# Patient Record
Sex: Female | Born: 1990 | Race: White | Hispanic: No | Smoking: Never smoker
Health system: Southern US, Community
[De-identification: ages and names within clinical notes are randomized; demographics above are authoritative.]

## PROBLEM LIST (undated history)

## (undated) DIAGNOSIS — O210 Mild hyperemesis gravidarum: Secondary | ICD-10-CM

## (undated) DIAGNOSIS — F53 Postpartum depression: Secondary | ICD-10-CM

## (undated) DIAGNOSIS — N83209 Unspecified ovarian cyst, unspecified side: Secondary | ICD-10-CM

## (undated) DIAGNOSIS — O99345 Other mental disorders complicating the puerperium: Secondary | ICD-10-CM

---

## 2019-03-25 ENCOUNTER — Emergency Department (HOSPITAL_BASED_OUTPATIENT_CLINIC_OR_DEPARTMENT_OTHER)
Admission: EM | Admit: 2019-03-25 | Discharge: 2019-03-25 | Disposition: A | Discharge status: Home / Self Care | Payer: Medicaid Other | Attending: Emergency Medicine | Admitting: Emergency Medicine

## 2019-03-25 ENCOUNTER — Emergency Department (HOSPITAL_BASED_OUTPATIENT_CLINIC_OR_DEPARTMENT_OTHER): Payer: Medicaid Other

## 2019-03-25 ENCOUNTER — Other Ambulatory Visit: Payer: Self-pay

## 2019-03-25 ENCOUNTER — Encounter (HOSPITAL_BASED_OUTPATIENT_CLINIC_OR_DEPARTMENT_OTHER): Payer: Self-pay | Admitting: *Deleted

## 2019-03-25 DIAGNOSIS — K529 Noninfective gastroenteritis and colitis, unspecified: Secondary | ICD-10-CM

## 2019-03-25 DIAGNOSIS — R1011 Right upper quadrant pain: Secondary | ICD-10-CM | POA: Diagnosis present

## 2019-03-25 DIAGNOSIS — Z79899 Other long term (current) drug therapy: Secondary | ICD-10-CM | POA: Diagnosis not present

## 2019-03-25 HISTORY — DX: Postpartum depression: F53.0

## 2019-03-25 HISTORY — DX: Other mental disorders complicating the puerperium: O99.345

## 2019-03-25 LAB — CBC WITH DIFFERENTIAL/PLATELET
Abs Immature Granulocytes: 0.03 10*3/uL (ref 0.00–0.07)
Basophils Absolute: 0 10*3/uL (ref 0.0–0.1)
Basophils Relative: 0 %
Eosinophils Absolute: 0.1 10*3/uL (ref 0.0–0.5)
Eosinophils Relative: 1 %
HCT: 39.2 % (ref 36.0–46.0)
Hemoglobin: 12.5 g/dL (ref 12.0–15.0)
Immature Granulocytes: 0 %
Lymphocytes Relative: 40 %
Lymphs Abs: 3.6 10*3/uL (ref 0.7–4.0)
MCH: 27.5 pg (ref 26.0–34.0)
MCHC: 31.9 g/dL (ref 30.0–36.0)
MCV: 86.3 fL (ref 80.0–100.0)
Monocytes Absolute: 0.5 10*3/uL (ref 0.1–1.0)
Monocytes Relative: 5 %
Neutro Abs: 5 10*3/uL (ref 1.7–7.7)
Neutrophils Relative %: 54 %
Platelets: 327 10*3/uL (ref 150–400)
RBC: 4.54 MIL/uL (ref 3.87–5.11)
RDW: 15.5 % (ref 11.5–15.5)
WBC: 9.2 10*3/uL (ref 4.0–10.5)
nRBC: 0 % (ref 0.0–0.2)

## 2019-03-25 LAB — COMPREHENSIVE METABOLIC PANEL
ALT: 12 U/L (ref 0–44)
AST: 16 U/L (ref 15–41)
Albumin: 3.2 g/dL — ABNORMAL LOW (ref 3.5–5.0)
Alkaline Phosphatase: 65 U/L (ref 38–126)
Anion gap: 10 (ref 5–15)
BUN: 7 mg/dL (ref 6–20)
CO2: 26 mmol/L (ref 22–32)
Calcium: 9.3 mg/dL (ref 8.9–10.3)
Chloride: 104 mmol/L (ref 98–111)
Creatinine, Ser: 0.84 mg/dL (ref 0.44–1.00)
GFR calc Af Amer: >60 mL/min (ref >60)
GFR calc non Af Amer: >60 mL/min (ref >60)
Glucose, Bld: 121 mg/dL — ABNORMAL HIGH (ref 70–99)
Potassium: 4.1 mmol/L (ref 3.5–5.1)
Sodium: 140 mmol/L (ref 135–145)
Total Bilirubin: 0.2 mg/dL — ABNORMAL LOW (ref 0.3–1.2)
Total Protein: 6.5 g/dL (ref 6.5–8.1)

## 2019-03-25 LAB — PREGNANCY, URINE: Preg Test, Ur: NEGATIVE

## 2019-03-25 LAB — LIPASE, BLOOD: Lipase: 25 U/L (ref 11–51)

## 2019-03-25 LAB — URINALYSIS, MICROSCOPIC (REFLEX)

## 2019-03-25 LAB — URINALYSIS, ROUTINE W REFLEX MICROSCOPIC
Bilirubin Urine: NEGATIVE
Glucose, UA: NEGATIVE mg/dL
Hgb urine dipstick: NEGATIVE
Ketones, ur: NEGATIVE mg/dL
Nitrite: NEGATIVE
Protein, ur: NEGATIVE mg/dL
Specific Gravity, Urine: 1.025 (ref 1.005–1.030)
pH: 6 (ref 5.0–8.0)

## 2019-03-25 MED ORDER — TRAMADOL HCL 50 MG PO TABS: 50.0000 mg | ORAL_TABLET | Freq: Four times a day (QID) | ORAL | 0 refills | Status: DC | PRN

## 2019-03-25 MED ORDER — ONDANSETRON HCL 4 MG/2ML IJ SOLN
4.0000 mg | Freq: Once | INTRAMUSCULAR | Status: AC
  Administered 2019-03-25: 4 mg via INTRAVENOUS
  Filled 2019-03-25: qty 2

## 2019-03-25 MED ORDER — MORPHINE SULFATE (PF) 4 MG/ML IV SOLN
4.0000 mg | Freq: Once | INTRAVENOUS | Status: AC
  Administered 2019-03-25: 4 mg via INTRAVENOUS
  Filled 2019-03-25: qty 1

## 2019-03-25 MED ORDER — SODIUM CHLORIDE 0.9 % IV BOLUS
1000.0000 mL | Freq: Once | INTRAVENOUS | Status: AC
  Administered 2019-03-25: 1000 mL via INTRAVENOUS

## 2019-03-25 MED ORDER — ONDANSETRON 8 MG PO TBDP: ORAL_TABLET | ORAL | 0 refills | Status: DC

## 2019-03-25 NOTE — Discharge Instructions (Addendum)
Take Zofran as prescribed as needed for nausea and tramadol as prescribed as needed for pain.  Return the emergency department if you develop severe abdominal pain, bloody stools, high fever, or other new and concerning symptoms.

## 2019-03-25 NOTE — ED Triage Notes (Signed)
Nausea and vomiting since Friday and RUQ pain started this morning.

## 2019-03-25 NOTE — ED Notes (Signed)
ED Provider at bedside. 

## 2019-03-25 NOTE — ED Provider Notes (Signed)
[facility] EMERGENCY DEPARTMENT Provider Note   CSN: [id number] Arrival date & time: 03/25/19  7829     History Chief Complaint  Patient presents with  . Nausea  . Vomiting, RUQ pain    Jordan Chung is a 29 y.o. female.  Patient is a 29 year old female with no significant past medical history.  She presents today for evaluation of right upper quadrant pain.  She reports nausea and vomiting 2 days ago, then developed the pain this morning.  She denies fevers or chills.  Her symptoms are worse when she attempts to eat.  She denies any urinary complaints.  She denies any irregularity with her menses.  The history is provided by the patient.       Past Medical History:  Diagnosis Date  . Postpartum depression     There are no problems to display for this patient.   History reviewed. No pertinent surgical history.   OB History    Gravida  1   Para  1   Term      Preterm      AB      Living        SAB      TAB      Ectopic      Multiple      Live Births              History reviewed. No pertinent family history.  Social History   Tobacco Use  . Smoking status: Never Smoker  . Smokeless tobacco: Never Used  Substance Use Topics  . Alcohol use: Never  . Drug use: Never    Home Medications Prior to Admission medications   Medication Sig Start Date End Date Taking? Authorizing Provider  buPROPion (WELLBUTRIN XL) 300 MG 24 hr tablet Take 300 mg by mouth daily.   Yes [provider]  LORazepam (ATIVAN) 0.5 MG tablet Take 0.5 mg by mouth every 8 (eight) hours as needed for anxiety.   Yes [provider]  norgestimate-ethinyl estradiol (ORTHO-CYCLEN) 0.25-35 MG-MCG tablet Take 1 tablet by mouth daily.   Yes [provider]    Allergies    Patient has no known allergies.  Review of Systems   Review of Systems  All other systems reviewed and are negative.   Physical Exam Updated Vital Signs LMP 03/13/2019    Physical Exam Vitals and nursing note reviewed.  Constitutional:      General: She is not in acute distress.    Appearance: She is well-developed. She is not diaphoretic.  HENT:     Head: Normocephalic and atraumatic.  Cardiovascular:     Rate and Rhythm: Normal rate and regular rhythm.     Heart sounds: No murmur. No friction rub. No gallop.   Pulmonary:     Effort: Pulmonary effort is normal. No respiratory distress.     Breath sounds: Normal breath sounds. No wheezing.  Abdominal:     General: Bowel sounds are normal. There is no distension.     Palpations: Abdomen is soft.     Tenderness: There is abdominal tenderness. There is no guarding or rebound.     Comments: There is ttp in the RUQ.  No rebound or guarding.  Musculoskeletal:        General: Normal range of motion.     Cervical back: Normal range of motion and neck supple.  Skin:    General: Skin is warm and dry.  Neurological:  Mental Status: She is alert and oriented to person, place, and time.     ED Results / Procedures / Treatments   Labs (all labs ordered are listed, but only abnormal results are displayed) Labs Reviewed  COMPREHENSIVE METABOLIC PANEL  LIPASE, BLOOD  CBC WITH DIFFERENTIAL/PLATELET  URINALYSIS, ROUTINE W REFLEX MICROSCOPIC  PREGNANCY, URINE    EKG None  Radiology No results found.  Procedures Procedures (including critical care time)  Medications Ordered in ED Medications  sodium chloride 0.9 % bolus 1,000 mL (has no administration in time range)  ondansetron (ZOFRAN) injection 4 mg (has no administration in time range)    ED Course  I have reviewed the triage vital signs and the nursing notes.  Pertinent labs & imaging results that were available during my care of the patient were reviewed by me and considered in my medical decision making (see chart for details).    MDM Rules/Calculators/A&P  Patient presenting with complaints of RUQ pain, nausea, and vomiting.   She is ttp in the RUQ, but workup is unremarkable.  US shows no abnormality, labs are normal, and UA is clear.  I suspect some sort of enteritis/colitis.  I have discussed this with the patient and will discharge with pain and nausea meds.  Patient offered CT scan, but we have decided to forego for now and see how she does.  If she worsens, she is to return to the ED for additional imaging.  Final Clinical Impression(s) / ED Diagnoses Final diagnoses:  RUQ pain    Rx / DC Orders ED Discharge Orders    None       Geoffery Lyons, MD 03/25/19 1030

## 2019-03-27 ENCOUNTER — Encounter (HOSPITAL_BASED_OUTPATIENT_CLINIC_OR_DEPARTMENT_OTHER): Payer: Self-pay | Admitting: Emergency Medicine

## 2019-03-27 ENCOUNTER — Emergency Department (HOSPITAL_BASED_OUTPATIENT_CLINIC_OR_DEPARTMENT_OTHER): Payer: Medicaid Other

## 2019-03-27 ENCOUNTER — Other Ambulatory Visit: Payer: Self-pay

## 2019-03-27 ENCOUNTER — Emergency Department (HOSPITAL_BASED_OUTPATIENT_CLINIC_OR_DEPARTMENT_OTHER)
Admission: EM | Admit: 2019-03-27 | Discharge: 2019-03-27 | Disposition: A | Discharge status: Home / Self Care | Payer: Medicaid Other | Attending: Emergency Medicine | Admitting: Emergency Medicine

## 2019-03-27 DIAGNOSIS — R1011 Right upper quadrant pain: Secondary | ICD-10-CM | POA: Diagnosis not present

## 2019-03-27 DIAGNOSIS — Z793 Long term (current) use of hormonal contraceptives: Secondary | ICD-10-CM | POA: Diagnosis not present

## 2019-03-27 DIAGNOSIS — Z79899 Other long term (current) drug therapy: Secondary | ICD-10-CM | POA: Insufficient documentation

## 2019-03-27 DIAGNOSIS — R1031 Right lower quadrant pain: Secondary | ICD-10-CM | POA: Insufficient documentation

## 2019-03-27 DIAGNOSIS — R109 Unspecified abdominal pain: Secondary | ICD-10-CM

## 2019-03-27 LAB — CBC WITH DIFFERENTIAL/PLATELET
Abs Immature Granulocytes: 0.03 10*3/uL (ref 0.00–0.07)
Basophils Absolute: 0 10*3/uL (ref 0.0–0.1)
Basophils Relative: 0 %
Eosinophils Absolute: 0 10*3/uL (ref 0.0–0.5)
Eosinophils Relative: 0 %
HCT: 42.9 % (ref 36.0–46.0)
Hemoglobin: 13.9 g/dL (ref 12.0–15.0)
Immature Granulocytes: 0 %
Lymphocytes Relative: 19 %
Lymphs Abs: 2.2 10*3/uL (ref 0.7–4.0)
MCH: 27.9 pg (ref 26.0–34.0)
MCHC: 32.4 g/dL (ref 30.0–36.0)
MCV: 86 fL (ref 80.0–100.0)
Monocytes Absolute: 0.2 10*3/uL (ref 0.1–1.0)
Monocytes Relative: 1 %
Neutro Abs: 9 10*3/uL — ABNORMAL HIGH (ref 1.7–7.7)
Neutrophils Relative %: 80 %
Platelets: 386 10*3/uL (ref 150–400)
RBC: 4.99 MIL/uL (ref 3.87–5.11)
RDW: 15.5 % (ref 11.5–15.5)
WBC: 11.5 10*3/uL — ABNORMAL HIGH (ref 4.0–10.5)
nRBC: 0 % (ref 0.0–0.2)

## 2019-03-27 LAB — URINALYSIS, ROUTINE W REFLEX MICROSCOPIC
Bilirubin Urine: NEGATIVE
Glucose, UA: NEGATIVE mg/dL
Hgb urine dipstick: NEGATIVE
Ketones, ur: 15 mg/dL — AB
Nitrite: NEGATIVE
Protein, ur: NEGATIVE mg/dL
Specific Gravity, Urine: 1.02 (ref 1.005–1.030)
pH: 7.5 (ref 5.0–8.0)

## 2019-03-27 LAB — LIPASE, BLOOD: Lipase: 28 U/L (ref 11–51)

## 2019-03-27 LAB — URINALYSIS, MICROSCOPIC (REFLEX)

## 2019-03-27 LAB — COMPREHENSIVE METABOLIC PANEL
ALT: 11 U/L (ref 0–44)
AST: 16 U/L (ref 15–41)
Albumin: 3.6 g/dL (ref 3.5–5.0)
Alkaline Phosphatase: 72 U/L (ref 38–126)
Anion gap: 8 (ref 5–15)
BUN: 10 mg/dL (ref 6–20)
CO2: 25 mmol/L (ref 22–32)
Calcium: 8.9 mg/dL (ref 8.9–10.3)
Chloride: 102 mmol/L (ref 98–111)
Creatinine, Ser: 0.99 mg/dL (ref 0.44–1.00)
GFR calc Af Amer: >60 mL/min (ref >60)
GFR calc non Af Amer: >60 mL/min (ref >60)
Glucose, Bld: 159 mg/dL — ABNORMAL HIGH (ref 70–99)
Potassium: 4 mmol/L (ref 3.5–5.1)
Sodium: 135 mmol/L (ref 135–145)
Total Bilirubin: 0.2 mg/dL — ABNORMAL LOW (ref 0.3–1.2)
Total Protein: 7.9 g/dL (ref 6.5–8.1)

## 2019-03-27 LAB — PREGNANCY, URINE: Preg Test, Ur: NEGATIVE

## 2019-03-27 MED ORDER — SODIUM CHLORIDE 0.9 % IV BOLUS
1000.0000 mL | Freq: Once | INTRAVENOUS | Status: AC
  Administered 2019-03-27: 1000 mL via INTRAVENOUS

## 2019-03-27 MED ORDER — DICYCLOMINE HCL 20 MG PO TABS: 20.0000 mg | ORAL_TABLET | Freq: Two times a day (BID) | ORAL | 0 refills | Status: DC

## 2019-03-27 MED ORDER — KETOROLAC TROMETHAMINE 30 MG/ML IJ SOLN
30.0000 mg | Freq: Once | INTRAMUSCULAR | Status: AC
  Administered 2019-03-27: 30 mg via INTRAVENOUS
  Filled 2019-03-27: qty 1

## 2019-03-27 MED ORDER — IOHEXOL 300 MG/ML  SOLN
100.0000 mL | Freq: Once | INTRAMUSCULAR | Status: AC | PRN
  Administered 2019-03-27: 100 mL via INTRAVENOUS

## 2019-03-27 NOTE — ED Triage Notes (Signed)
Reports being seen Wednesday for the same.  Continues to have RUQ abdominal pain with n/v.  Able to keep water and gatorade down only.

## 2019-03-27 NOTE — ED Provider Notes (Addendum)
[facility] EMERGENCY DEPARTMENT Provider Note   CSN: [id number] Arrival date & time: 03/27/19  1753     History Chief Complaint  Patient presents with  . Abdominal Pain    Jordan Chung is a 29 y.o. female with no significant PMH with complaints of persistent right upper quadrant abdominal discomfort that has now moved down into the RLQ region, as well.  Patient was evaluated on 03/25/2019 in the ER and ultrasound obtained of right upper quadrant was negative for any acute pathology.  She declined CT abdomen and pelvis and was ultimately diagnosed with a colitis/enteritis.  She was instructed to return to the ED should she experience any new or worsening symptoms.  Patient states that she is been taking her Zofran ODT, as prescribed, without relief.  She also reports that her abdominal discomfort has worsened and is now 8 out of 10 pain in her RUQ and RLQ region.  She also endorses diminished appetite and worsening abdominal discomfort two hours after consumption of food.  She spoke with her PCP today who instructed her to come to the ED for imaging.  She denies any fevers or chills, headache or dizziness, chest pain or difficulty breathing, cough, dysuria, increased urinary frequency, vaginal pain, vaginal discharge, vaginal bleeding, hematochezia, melena, or other changes in bowel habits.  HPI     Past Medical History:  Diagnosis Date  . Postpartum depression     There are no problems to display for this patient.   History reviewed. No pertinent surgical history.   OB History    Gravida  1   Para  1   Term      Preterm      AB      Living        SAB      TAB      Ectopic      Multiple      Live Births              No family history on file.  Social History   Tobacco Use  . Smoking status: Never Smoker  . Smokeless tobacco: Never Used  Substance Use Topics  . Alcohol use: Never  . Drug use: Never    Home Medications Prior to Admission  medications   Medication Sig Start Date End Date Taking? Authorizing Provider  omeprazole (PRILOSEC) 20 MG capsule Take 20 mg by mouth daily.   Yes [provider]  predniSONE (STERAPRED UNI-PAK 21 TAB) 10 MG (21) TBPK tablet Take 60 mg by mouth daily. Dose pack   Yes [provider]  promethazine (PHENERGAN) 25 MG tablet Take 25 mg by mouth 2 (two) times daily as needed for nausea or vomiting.   Yes [provider]  Sulfacetamide Sodium (SULFAC OP) Apply to eye.   Yes [provider]  sulfamethoxazole-trimethoprim (BACTRIM) 400-80 MG tablet Take 1 tablet by mouth 2 (two) times daily.   Yes [provider]  buPROPion (WELLBUTRIN XL) 300 MG 24 hr tablet Take 300 mg by mouth daily.    [provider]  dicyclomine (BENTYL) 20 MG tablet Take 1 tablet (20 mg total) by mouth 2 (two) times daily. 03/27/19   Lorelee New, PA-C  LORazepam (ATIVAN) 0.5 MG tablet Take 0.5 mg by mouth every 8 (eight) hours as needed for anxiety.    [provider]  norgestimate-ethinyl estradiol (ORTHO-CYCLEN) 0.25-35 MG-MCG tablet Take 1 tablet by mouth daily.    [provider]  ondansetron (ZOFRAN ODT) 8 MG disintegrating tablet 8mg  ODT q4 hours prn nausea 03/25/19   Veryl Speak, MD  traMADol (ULTRAM) 50 MG tablet Take 1 tablet (50 mg total) by mouth every 6 (six) hours as needed. 03/25/19   Veryl Speak, MD    Allergies    Patient has no known allergies.  Review of Systems   Review of Systems  All other systems reviewed and are negative.   Physical Exam Updated Vital Signs BP 115/78 (BP Location: Right Arm)   Pulse 80   Temp 98.6 F (37 C) (Oral)   Resp 14   Ht 5\' 8"  (1.727 m)   Wt 126.1 kg   LMP 03/13/2019   SpO2 98%   BMI 42.27 kg/m   Physical Exam Vitals and nursing note reviewed. Exam conducted with a chaperone present.  Constitutional:      Appearance: Normal appearance.  HENT:     Head: Normocephalic and atraumatic.   Eyes:     General: No scleral icterus.    Conjunctiva/sclera: Conjunctivae normal.  Pulmonary:     Effort: Pulmonary effort is normal.  Abdominal:     Comments: Soft, nondistended. TTP in RUQ and RLQ. No significant TTP elsewhere. No guarding. No overlying skin changes. NABS.  Skin:    General: Skin is dry.  Neurological:     Mental Status: She is alert.     GCS: GCS eye subscore is 4. GCS verbal subscore is 5. GCS motor subscore is 6.  Psychiatric:        Mood and Affect: Mood normal.        Behavior: Behavior normal.        Thought Content: Thought content normal.     ED Results / Procedures / Treatments   Labs (all labs ordered are listed, but only abnormal results are displayed) Labs Reviewed  CBC WITH DIFFERENTIAL/PLATELET - Abnormal; Notable for the following components:      Result Value   WBC 11.5 (*)    Neutro Abs 9.0 (*)    All other components within normal limits  COMPREHENSIVE METABOLIC PANEL - Abnormal; Notable for the following components:   Glucose, Bld 159 (*)    Total Bilirubin 0.2 (*)    All other components within normal limits  URINALYSIS, ROUTINE W REFLEX MICROSCOPIC - Abnormal; Notable for the following components:   APPearance HAZY (*)    Ketones, ur 15 (*)    Leukocytes,Ua SMALL (*)    All other components within normal limits  URINALYSIS, MICROSCOPIC (REFLEX) - Abnormal; Notable for the following components:   Bacteria, UA FEW (*)    All other components within normal limits  LIPASE, BLOOD  PREGNANCY, URINE    EKG None  Radiology CT ABDOMEN PELVIS W CONTRAST  Result Date: 03/27/2019 CLINICAL DATA:  Right lower quadrant pain EXAM: CT ABDOMEN AND PELVIS WITH CONTRAST TECHNIQUE: Multidetector CT imaging of the abdomen and pelvis was performed using the standard protocol following bolus administration of intravenous contrast. CONTRAST:  147mL OMNIPAQUE IOHEXOL 300 MG/ML  SOLN COMPARISON:  None. FINDINGS: Lower chest: Lung bases are clear. No  effusions. Heart is normal size. Hepatobiliary: No focal hepatic abnormality. Gallbladder unremarkable. Pancreas: No focal abnormality or ductal dilatation. Spleen: No focal abnormality.  Normal size. Adrenals/Urinary Tract: Punctate nonobstructing stones in the kidneys bilaterally. No ureteral stones or hydronephrosis. Adrenal glands and urinary bladder unremarkable. Stomach/Bowel: Stomach, large and small bowel grossly unremarkable. Normal appendix. Vascular/Lymphatic: No evidence of aneurysm or adenopathy. Reproductive: Uterus and  adnexa unremarkable.  No mass. Other: No free fluid or free air. Musculoskeletal: No acute.  Bony abnormality IMPRESSION: Normal appendix. Punctate bilateral nephrolithiasis. No ureteral stones or hydronephrosis. No acute findings. Electronically Signed   By: Charlett Nose M.D.   On: 03/27/2019 21:13    Procedures Procedures (including critical care time)  Medications Ordered in ED Medications  ketorolac (TORADOL) 30 MG/ML injection 30 mg (30 mg Intravenous Given 03/27/19 2006)  sodium chloride 0.9 % bolus 1,000 mL (0 mLs Intravenous Stopped 03/27/19 2124)  iohexol (OMNIPAQUE) 300 MG/ML solution 100 mL (100 mLs Intravenous Contrast Given 03/27/19 2058)    ED Course  I have reviewed the triage vital signs and the nursing notes.  Pertinent labs & imaging results that were available during my care of the patient were reviewed by me and considered in my medical decision making (see chart for details).    MDM Rules/Calculators/A&P                      Patient's lab work is reassuring.  She had mild leukocytosis of 11.5, but also to start prednisone this morning as prescribed by her PCP.  CMP did not demonstrate any elevated liver enzymes or other abnormalities. Lipase within normal limits.  UA unremarkable.  I reviewed patient's CT of abdomen and pelvis which demonstrated no acute intra-abdominal pathology.  Her ovaries were also well visualized and no masses or surrounding  fluid appreciated.  Do not feel as though transvaginal ultrasound or other imaging is warranted at this time.  I reviewed her right upper quadrant ultrasound from 2 days ago that did not demonstrate any cholelithiasis.  Recommending that patient continue taking her medications as prescribed by her primary care provider.  We will also prescribe a short course of Bentyl to see if that provides any additional relief.  She is in no acute distress and feels much improved after administration of IV Toradol.  Her vital signs have remained within normal limits and she feels safe for discharge.  Informed patient that she may benefit from gastroenterology referral at some point if her symptoms fail to improve.  Strict ED return precautions discussed.  All of the evaluation and work-up results were discussed with the patient and any family at bedside. They were provided opportunity to ask any additional questions and have none at this time. They have expressed understanding of verbal discharge instructions as well as return precautions and are agreeable to the plan.    Final Clinical Impression(s) / ED Diagnoses Final diagnoses:  Right-sided abdominal pain of unknown cause    Rx / DC Orders ED Discharge Orders         Ordered    dicyclomine (BENTYL) 20 MG tablet  2 times daily     03/27/19 2158           Elvera Maria 03/27/19 2212    Linwood Dibbles, MD 03/28/19 1210    Lorelee New, PA-C 04/16/19 7858    Linwood Dibbles, MD 04/16/19 1034

## 2019-03-27 NOTE — Discharge Instructions (Signed)
Please follow-up with your primary care provider regarding today's encounter.  Continue take your medications as prescribed.  I have also prescribed you a short course of Bentyl to see if that provides any additional relief.  Return to the ED or seek immediate medical attention should you experience any new or worsening symptoms.

## 2019-10-05 ENCOUNTER — Emergency Department (HOSPITAL_BASED_OUTPATIENT_CLINIC_OR_DEPARTMENT_OTHER)
Admission: EM | Admit: 2019-10-05 | Discharge: 2019-10-06 | Disposition: A | Discharge status: Home / Self Care | Payer: Medicaid Other | Attending: Emergency Medicine | Admitting: Emergency Medicine

## 2019-10-05 ENCOUNTER — Other Ambulatory Visit: Payer: Self-pay

## 2019-10-05 ENCOUNTER — Encounter (HOSPITAL_BASED_OUTPATIENT_CLINIC_OR_DEPARTMENT_OTHER): Payer: Self-pay | Admitting: Emergency Medicine

## 2019-10-05 DIAGNOSIS — R112 Nausea with vomiting, unspecified: Secondary | ICD-10-CM

## 2019-10-05 DIAGNOSIS — Z3A12 12 weeks gestation of pregnancy: Secondary | ICD-10-CM | POA: Insufficient documentation

## 2019-10-05 DIAGNOSIS — O219 Vomiting of pregnancy, unspecified: Secondary | ICD-10-CM | POA: Diagnosis not present

## 2019-10-05 HISTORY — DX: Mild hyperemesis gravidarum: O21.0

## 2019-10-05 LAB — URINALYSIS, ROUTINE W REFLEX MICROSCOPIC
Glucose, UA: NEGATIVE mg/dL
Hgb urine dipstick: NEGATIVE
Ketones, ur: NEGATIVE mg/dL
Nitrite: NEGATIVE
Protein, ur: NEGATIVE mg/dL
Specific Gravity, Urine: 1.025 (ref 1.005–1.030)
pH: 6 (ref 5.0–8.0)

## 2019-10-05 LAB — CBC WITH DIFFERENTIAL/PLATELET
Abs Immature Granulocytes: 0.03 10*3/uL (ref 0.00–0.07)
Basophils Absolute: 0 10*3/uL (ref 0.0–0.1)
Basophils Relative: 0 %
Eosinophils Absolute: 0.1 10*3/uL (ref 0.0–0.5)
Eosinophils Relative: 1 %
HCT: 40.1 % (ref 36.0–46.0)
Hemoglobin: 13.1 g/dL (ref 12.0–15.0)
Immature Granulocytes: 0 %
Lymphocytes Relative: 33 %
Lymphs Abs: 4 10*3/uL (ref 0.7–4.0)
MCH: 27.7 pg (ref 26.0–34.0)
MCHC: 32.7 g/dL (ref 30.0–36.0)
MCV: 84.8 fL (ref 80.0–100.0)
Monocytes Absolute: 0.6 10*3/uL (ref 0.1–1.0)
Monocytes Relative: 5 %
Neutro Abs: 7.4 10*3/uL (ref 1.7–7.7)
Neutrophils Relative %: 61 %
Platelets: 286 10*3/uL (ref 150–400)
RBC: 4.73 MIL/uL (ref 3.87–5.11)
RDW: 15.5 % (ref 11.5–15.5)
WBC: 12.1 10*3/uL — ABNORMAL HIGH (ref 4.0–10.5)
nRBC: 0 % (ref 0.0–0.2)

## 2019-10-05 LAB — COMPREHENSIVE METABOLIC PANEL
ALT: 23 U/L (ref 0–44)
AST: 21 U/L (ref 15–41)
Albumin: 3.7 g/dL (ref 3.5–5.0)
Alkaline Phosphatase: 53 U/L (ref 38–126)
Anion gap: 9 (ref 5–15)
BUN: 5 mg/dL — ABNORMAL LOW (ref 6–20)
CO2: 22 mmol/L (ref 22–32)
Calcium: 8.6 mg/dL — ABNORMAL LOW (ref 8.9–10.3)
Chloride: 107 mmol/L (ref 98–111)
Creatinine, Ser: 0.54 mg/dL (ref 0.44–1.00)
GFR calc Af Amer: >60 mL/min (ref >60)
GFR calc non Af Amer: >60 mL/min (ref >60)
Glucose, Bld: 108 mg/dL — ABNORMAL HIGH (ref 70–99)
Potassium: 3.6 mmol/L (ref 3.5–5.1)
Sodium: 138 mmol/L (ref 135–145)
Total Bilirubin: 0.5 mg/dL (ref 0.3–1.2)
Total Protein: 7.3 g/dL (ref 6.5–8.1)

## 2019-10-05 LAB — URINALYSIS, MICROSCOPIC (REFLEX)

## 2019-10-05 LAB — LIPASE, BLOOD: Lipase: 25 U/L (ref 11–51)

## 2019-10-05 LAB — CBG MONITORING, ED: Glucose-Capillary: 106 mg/dL — ABNORMAL HIGH (ref 70–99)

## 2019-10-05 MED ORDER — ONDANSETRON HCL 4 MG/2ML IJ SOLN
4.0000 mg | Freq: Once | INTRAMUSCULAR | Status: AC
  Administered 2019-10-06: 4 mg via INTRAVENOUS
  Filled 2019-10-05: qty 2

## 2019-10-05 MED ORDER — LACTATED RINGERS IV BOLUS
1000.0000 mL | Freq: Once | INTRAVENOUS | Status: AC
  Administered 2019-10-06: 1000 mL via INTRAVENOUS

## 2019-10-05 NOTE — ED Notes (Signed)
States feels lightheaded, diaphoretic, pale. Actively vomiting

## 2019-10-05 NOTE — ED Notes (Signed)
CBG 106 

## 2019-10-05 NOTE — ED Triage Notes (Addendum)
States is [redacted] weeks pregnant with vomiting, through pregnancy. Has been unable to eat/drink in the last 3 days without vomiting . Denies abd pain . Has an established OB/GYN. Taking Diclegis without relief

## 2019-10-06 MED ORDER — ONDANSETRON 4 MG PO TBDP: ORAL_TABLET | ORAL | 0 refills | Status: DC

## 2019-10-06 MED ORDER — ONDANSETRON HCL 4 MG/2ML IJ SOLN
4.0000 mg | Freq: Once | INTRAMUSCULAR | Status: AC
  Administered 2019-10-06: 4 mg via INTRAVENOUS
  Filled 2019-10-06: qty 2

## 2019-10-06 NOTE — ED Provider Notes (Signed)
[facility] EMERGENCY DEPARTMENT Provider Note   CSN: [id number] Arrival date & time: 10/05/19  2153     History Chief Complaint  Patient presents with  . Emesis    [redacted] weeks pregnant     Jordan Chung is a 29 y.o. female.  G2 P1 approximately 12 weeks here with emesis.  Patient has no other complaints.  She stated she had vomiting with her whole previous pregnancy and get a Zofran port placed and a nurse coming out to give her boluses and managing her nausea for the whole pregnancy.  Patient states this is been going on again this time.  Is more manageable for the first 10 or 11 weeks but now she denies not able to tolerate liquids anymore so she presents here for further evaluation.  She has no urinary symptoms, back pain, abdominal pain.  No fevers.  No sick contacts.  No other associated symptoms.   Emesis      Past Medical History:  Diagnosis Date  . Hyperemesis gravidarum   . Postpartum depression     There are no problems to display for this patient.   History reviewed. No pertinent surgical history.   OB History    Gravida  2   Para  1   Term      Preterm      AB      Living        SAB      TAB      Ectopic      Multiple      Live Births              No family history on file.  Social History   Tobacco Use  . Smoking status: Never Smoker  . Smokeless tobacco: Never Used  Substance Use Topics  . Alcohol use: Never  . Drug use: Never    Home Medications Prior to Admission medications   Medication Sig Start Date End Date Taking? Authorizing Provider  Doxylamine-Pyridoxine (DICLEGIS PO) Take 10 mg by mouth.   Yes [provider]  predniSONE (STERAPRED UNI-PAK 21 TAB) 10 MG (21) TBPK tablet Take 60 mg by mouth daily. Dose pack   Yes [provider]  Prenatal Vit-Fe Fumarate-FA (MULTIVITAMIN-PRENATAL) 27-0.8 MG TABS tablet Take 1 tablet by mouth daily.   Yes [provider]  buPROPion (WELLBUTRIN XL)  300 MG 24 hr tablet Take 300 mg by mouth daily.    [provider]  LORazepam (ATIVAN) 0.5 MG tablet Take 0.5 mg by mouth every 8 (eight) hours as needed for anxiety.    [provider]  norgestimate-ethinyl estradiol (ORTHO-CYCLEN) 0.25-35 MG-MCG tablet Take 1 tablet by mouth daily.    [provider]  omeprazole (PRILOSEC) 20 MG capsule Take 20 mg by mouth daily.    [provider]  ondansetron (ZOFRAN ODT) 4 MG disintegrating tablet 4mg  ODT q4 hours prn nausea/vomit 10/06/19   Ambur Province, 10/08/19, MD  promethazine (PHENERGAN) 25 MG tablet Take 25 mg by mouth 2 (two) times daily as needed for nausea or vomiting.    [provider]  Sulfacetamide Sodium (SULFAC OP) Apply to eye.    [provider]  traMADol (ULTRAM) 50 MG tablet Take 1 tablet (50 mg total) by mouth every 6 (six) hours as needed. 03/25/19   03/27/19, MD  dicyclomine (BENTYL) 20 MG tablet Take 1 tablet (20 mg total) by mouth 2 (two) times daily. 03/27/19 10/06/19  10/08/19, PA-C  Allergies    Patient has no known allergies.  Review of Systems   Review of Systems  Gastrointestinal: Positive for vomiting.  All other systems reviewed and are negative.   Physical Exam Updated Vital Signs BP 125/71   Pulse 82   Temp 98.5 F (36.9 C) (Oral)   Resp 16   Ht 5\' 8"  (1.727 m)   Wt 117.9 kg   LMP 07/23/2019 (Approximate) Comment: Gravida 2, Para 1  SpO2 100%   BMI 39.53 kg/m   Physical Exam Vitals and nursing note reviewed.  Constitutional:      Appearance: She is well-developed.  HENT:     Head: Normocephalic and atraumatic.     Mouth/Throat:     Mouth: Mucous membranes are moist.     Pharynx: Oropharynx is clear.  Eyes:     Pupils: Pupils are equal, round, and reactive to light.  Cardiovascular:     Rate and Rhythm: Normal rate and regular rhythm.  Pulmonary:     Effort: No respiratory distress.     Breath sounds: No stridor.  Abdominal:     General:  There is no distension.  Musculoskeletal:        General: No swelling or tenderness. Normal range of motion.     Cervical back: Normal range of motion.  Skin:    General: Skin is warm and dry.  Neurological:     General: No focal deficit present.     Mental Status: She is alert.     ED Results / Procedures / Treatments   Labs (all labs ordered are listed, but only abnormal results are displayed) Labs Reviewed  URINALYSIS, ROUTINE W REFLEX MICROSCOPIC - Abnormal; Notable for the following components:      Result Value   APPearance HAZY (*)    Bilirubin Urine SMALL (*)    Leukocytes,Ua SMALL (*)    All other components within normal limits  COMPREHENSIVE METABOLIC PANEL - Abnormal; Notable for the following components:   Glucose, Bld 108 (*)    BUN 5 (*)    Calcium 8.6 (*)    All other components within normal limits  CBC WITH DIFFERENTIAL/PLATELET - Abnormal; Notable for the following components:   WBC 12.1 (*)    All other components within normal limits  URINALYSIS, MICROSCOPIC (REFLEX) - Abnormal; Notable for the following components:   Bacteria, UA MANY (*)    All other components within normal limits  CBG MONITORING, ED - Abnormal; Notable for the following components:   Glucose-Capillary 106 (*)    All other components within normal limits  URINE CULTURE  LIPASE, BLOOD    EKG None  Radiology No results found.  Procedures Procedures (including critical care time)  Medications Ordered in ED Medications  lactated ringers bolus 1,000 mL ( Intravenous Stopped 10/06/19 0112)  ondansetron (ZOFRAN) injection 4 mg (4 mg Intravenous Given 10/06/19 0011)  ondansetron (ZOFRAN) injection 4 mg (4 mg Intravenous Given 10/06/19 0250)    ED Course  I have reviewed the triage vital signs and the nursing notes.  Pertinent labs & imaging results that were available during my care of the patient were reviewed by me and considered in my medical decision making (see chart for  details).    MDM Rules/Calculators/A&P                         No evidence of preeclampsia.  Patient does have any signs or symptoms of urinary tract infection  so we will hold on antibiotics and just add on a urine culture.  We will going give a liter of fluids along with some Zofran. Reevaluate for likely discharge and OB follow-up.  Emesis slightly improved without witnessed events in ER. Patient requesting discharge. No immediate indication for further management or workup in ED, appropriate for discharge with ob follow up.   Final Clinical Impression(s) / ED Diagnoses Final diagnoses:  Non-intractable vomiting with nausea, unspecified vomiting type    Rx / DC Orders ED Discharge Orders         Ordered    ondansetron (ZOFRAN ODT) 4 MG disintegrating tablet        10/06/19 0344           Timarie Labell, Barbara Cower, MD 10/06/19 [phone]

## 2019-10-07 LAB — URINE CULTURE: Culture: <10000 — AB

## 2019-12-05 ENCOUNTER — Encounter (HOSPITAL_COMMUNITY): Payer: Self-pay | Admitting: Obstetrics & Gynecology

## 2019-12-05 ENCOUNTER — Other Ambulatory Visit: Payer: Self-pay

## 2019-12-05 ENCOUNTER — Inpatient Hospital Stay (HOSPITAL_COMMUNITY)
Admission: AD | Admit: 2019-12-05 | Discharge: 2019-12-05 | Disposition: A | Discharge status: Home / Self Care | Payer: Medicaid Other | Attending: Obstetrics & Gynecology | Admitting: Obstetrics & Gynecology

## 2019-12-05 DIAGNOSIS — K5909 Other constipation: Secondary | ICD-10-CM | POA: Insufficient documentation

## 2019-12-05 DIAGNOSIS — O26892 Other specified pregnancy related conditions, second trimester: Secondary | ICD-10-CM | POA: Diagnosis not present

## 2019-12-05 DIAGNOSIS — K5900 Constipation, unspecified: Secondary | ICD-10-CM | POA: Diagnosis present

## 2019-12-05 DIAGNOSIS — R110 Nausea: Secondary | ICD-10-CM

## 2019-12-05 DIAGNOSIS — O219 Vomiting of pregnancy, unspecified: Secondary | ICD-10-CM | POA: Insufficient documentation

## 2019-12-05 DIAGNOSIS — O99612 Diseases of the digestive system complicating pregnancy, second trimester: Secondary | ICD-10-CM | POA: Insufficient documentation

## 2019-12-05 DIAGNOSIS — Z3A19 19 weeks gestation of pregnancy: Secondary | ICD-10-CM | POA: Diagnosis not present

## 2019-12-05 DIAGNOSIS — O2242 Hemorrhoids in pregnancy, second trimester: Secondary | ICD-10-CM | POA: Diagnosis not present

## 2019-12-05 LAB — URINALYSIS, ROUTINE W REFLEX MICROSCOPIC
Bilirubin Urine: NEGATIVE
Glucose, UA: NEGATIVE mg/dL
Hgb urine dipstick: NEGATIVE
Ketones, ur: NEGATIVE mg/dL
Nitrite: NEGATIVE
Protein, ur: 30 mg/dL — AB
Specific Gravity, Urine: 1.021 (ref 1.005–1.030)
pH: 6 (ref 5.0–8.0)

## 2019-12-05 MED ORDER — DOCUSATE SODIUM 100 MG PO CAPS: 100.0000 mg | ORAL_CAPSULE | Freq: Two times a day (BID) | ORAL | 2 refills | Status: DC

## 2019-12-05 MED ORDER — FLEET ENEMA 7-19 GM/118ML RE ENEM
1.0000 | ENEMA | Freq: Once | RECTAL | Status: AC
  Administered 2019-12-05: 1 via RECTAL

## 2019-12-05 MED ORDER — NITROGLYCERIN 2 % TD OINT
0.5000 [in_us] | TOPICAL_OINTMENT | Freq: Four times a day (QID) | TRANSDERMAL | Status: DC
  Administered 2019-12-05: 0.5 [in_us] via TOPICAL
  Filled 2019-12-05: qty 30

## 2019-12-05 MED ORDER — METOCLOPRAMIDE HCL 10 MG PO TABS: 10.0000 mg | ORAL_TABLET | Freq: Three times a day (TID) | ORAL | 1 refills | Status: DC

## 2019-12-05 NOTE — MAU Note (Signed)
Called OB nurse this morning. Has been impacted for 5 days(Miralax- has been on that for quite some time), now is "sprouting some hemorrhoids that are excruciating.(tucks and preparation H). They are not bleeding.

## 2019-12-05 NOTE — MAU Provider Note (Signed)
History     CSN: [id number]  Arrival date and time: 12/05/19 1123   First Provider Initiated Contact with Patient 12/05/19 1233      Chief Complaint  Patient presents with  . Constipation  . Fecal Impaction  . Hemorrhoids   HPI   Ms.Jordan Chung is a 29 y.o. female G2P1001 @ [redacted]w[redacted]d here in MAU with chronic constipation and hemorrhoids. She has been taking Zofran for hyperemesis for most of her pregnancy and feels this is likely contributing to her constipation. She has nausea more than vomiting. She reports not having a normal BM in over a month. She had a small BM in the last week. She has had some generalized abdominal pain that has been present for a while. She reports an increase in pain from her hemorrhoids. She has tried several OTC  hemorrhoid treatments without much relief.    She is getting her care in [place].   OB History    Gravida  2   Para  1   Term  1   Preterm  0   AB  0   Living  1     SAB  0   TAB  0   Ectopic  0   Multiple  0   Live Births  1           Past Medical History:  Diagnosis Date  . Hyperemesis gravidarum   . Postpartum depression     History reviewed. No pertinent surgical history.  History reviewed. No pertinent family history.  Social History   Tobacco Use  . Smoking status: Never Smoker  . Smokeless tobacco: Never Used  Substance Use Topics  . Alcohol use: Never  . Drug use: Never    Allergies: No Known Allergies  Medications Prior to Admission  Medication Sig Dispense Refill Last Dose  . ondansetron (ZOFRAN ODT) 4 MG disintegrating tablet 4mg  ODT q4 hours prn nausea/vomit 30 tablet 0 12/05/2019 at Unknown time  . polyethylene glycol (MIRALAX / GLYCOLAX) 17 g packet Take 17 g by mouth daily.   12/05/2019 at Unknown time  . Prenatal Vit-Fe Fumarate-FA (MULTIVITAMIN-PRENATAL) 27-0.8 MG TABS tablet Take 1 tablet by mouth daily.   12/05/2019 at Unknown time  . buPROPion (WELLBUTRIN XL) 300 MG 24 hr tablet Take 300 mg  by mouth daily.     . Doxylamine-Pyridoxine (DICLEGIS PO) Take 10 mg by mouth.     12/07/2019 LORazepam (ATIVAN) 0.5 MG tablet Take 0.5 mg by mouth every 8 (eight) hours as needed for anxiety.     . norgestimate-ethinyl estradiol (ORTHO-CYCLEN) 0.25-35 MG-MCG tablet Take 1 tablet by mouth daily.     09-24-1998 omeprazole (PRILOSEC) 20 MG capsule Take 20 mg by mouth daily.     . predniSONE (STERAPRED UNI-PAK 21 TAB) 10 MG (21) TBPK tablet Take 60 mg by mouth daily. Dose pack     . promethazine (PHENERGAN) 25 MG tablet Take 25 mg by mouth 2 (two) times daily as needed for nausea or vomiting.     . Sulfacetamide Sodium (SULFAC OP) Apply to eye.     . traMADol (ULTRAM) 50 MG tablet Take 1 tablet (50 mg total) by mouth every 6 (six) hours as needed. 10 tablet 0    Results for orders placed or performed during the hospital encounter of 12/05/19 (from the past 48 hour(s))  Urinalysis, Routine w reflex microscopic Urine, Clean Catch     Status: Abnormal   Collection Time: 12/05/19 11:55 AM  Result Value  Ref Range   Color, Urine YELLOW YELLOW   APPearance CLOUDY (A) CLEAR   Specific Gravity, Urine 1.021 1.005 - 1.030   pH 6.0 5.0 - 8.0   Glucose, UA NEGATIVE NEGATIVE mg/dL   Hgb urine dipstick NEGATIVE NEGATIVE   Bilirubin Urine NEGATIVE NEGATIVE   Ketones, ur NEGATIVE NEGATIVE mg/dL   Protein, ur 30 (A) NEGATIVE mg/dL   Nitrite NEGATIVE NEGATIVE   Leukocytes,Ua LARGE (A) NEGATIVE   RBC / HPF 0-5 0 - 5 RBC/hpf   WBC, UA 11-20 0 - 5 WBC/hpf   Bacteria, UA RARE (A) NONE SEEN   Squamous Epithelial / LPF 21-50 0 - 5   Mucus PRESENT     Comment: Performed at [facility], [address], [place], [place] [address]   Review of Systems  Constitutional: Negative for chills.  Gastrointestinal: Positive for constipation, nausea and rectal pain. Negative for anal bleeding, blood in stool and vomiting.   Physical Exam   Blood pressure (!) 107/54, pulse 71, temperature 98.2 F (36.8 C), temperature source Oral,  resp. rate 17, height 5\' 8"  (1.727 m), weight 116.7 kg, last menstrual period 07/23/2019, SpO2 99 %.  Physical Exam Constitutional:      General: She is not in acute distress.    Appearance: She is not ill-appearing, toxic-appearing or diaphoretic.  HENT:     Head: Normocephalic.  Eyes:     Pupils: Pupils are equal, round, and reactive to light.  Abdominal:     Tenderness: There is no abdominal tenderness.  Genitourinary:    Rectum: Tenderness and external hemorrhoid present.  Musculoskeletal:        General: Normal range of motion.  Neurological:     Mental Status: She is alert and oriented to person, place, and time.  Psychiatric:        Behavior: Behavior normal.    MAU Course  Procedures  None  MDM  + fetal heart tones via doppler  Fleets enema with good/large results Patient feels much better External nitro cream applied to hemorrhoid.  Large leukocytes on Urine, Urine culture pending.   Assessment and Plan   A:  1. Nausea   2. [redacted] weeks gestation of pregnancy   3. Constipation, unspecified constipation type   4. Hemorrhoids during pregnancy in second trimester     P:  Discharge home in stable condition Rx: Colace, Reglan Stop Zofran, unless absolutely necessary. Increase oral fluid Start a fiber supplement Ok to use external nitro cream, may need general surgery   Mazelle Huebert, 07/25/2019, NP 12/05/2019 1:57 PM

## 2019-12-05 NOTE — Discharge Instructions (Signed)

## 2019-12-06 LAB — CULTURE, OB URINE

## 2020-05-09 ENCOUNTER — Emergency Department (HOSPITAL_COMMUNITY): Payer: Medicaid Other | Admitting: Registered Nurse

## 2020-05-09 ENCOUNTER — Encounter (HOSPITAL_COMMUNITY)
Admission: EM | Disposition: A | Discharge status: Home / Self Care | Payer: Self-pay | Source: Home / Self Care | Attending: Emergency Medicine

## 2020-05-09 ENCOUNTER — Encounter (HOSPITAL_BASED_OUTPATIENT_CLINIC_OR_DEPARTMENT_OTHER): Payer: Self-pay | Admitting: Emergency Medicine

## 2020-05-09 ENCOUNTER — Emergency Department (HOSPITAL_COMMUNITY): Payer: Medicaid Other

## 2020-05-09 ENCOUNTER — Ambulatory Visit (HOSPITAL_BASED_OUTPATIENT_CLINIC_OR_DEPARTMENT_OTHER)
Admission: EM | Admit: 2020-05-09 | Discharge: 2020-05-09 | Disposition: A | Discharge status: Home / Self Care | Payer: Medicaid Other | Attending: Emergency Medicine | Admitting: Emergency Medicine

## 2020-05-09 ENCOUNTER — Other Ambulatory Visit: Payer: Self-pay

## 2020-05-09 DIAGNOSIS — Z20822 Contact with and (suspected) exposure to covid-19: Secondary | ICD-10-CM | POA: Insufficient documentation

## 2020-05-09 DIAGNOSIS — R1011 Right upper quadrant pain: Secondary | ICD-10-CM | POA: Diagnosis present

## 2020-05-09 DIAGNOSIS — Z8759 Personal history of other complications of pregnancy, childbirth and the puerperium: Secondary | ICD-10-CM | POA: Diagnosis not present

## 2020-05-09 DIAGNOSIS — K8020 Calculus of gallbladder without cholecystitis without obstruction: Secondary | ICD-10-CM

## 2020-05-09 DIAGNOSIS — K8012 Calculus of gallbladder with acute and chronic cholecystitis without obstruction: Secondary | ICD-10-CM | POA: Diagnosis not present

## 2020-05-09 HISTORY — PX: CHOLECYSTECTOMY: SHX55

## 2020-05-09 LAB — URINALYSIS, ROUTINE W REFLEX MICROSCOPIC
Bilirubin Urine: NEGATIVE
Glucose, UA: NEGATIVE mg/dL
Hgb urine dipstick: NEGATIVE
Ketones, ur: NEGATIVE mg/dL
Leukocytes,Ua: NEGATIVE
Nitrite: NEGATIVE
Protein, ur: NEGATIVE mg/dL
Specific Gravity, Urine: 1.02 (ref 1.005–1.030)
pH: 6 (ref 5.0–8.0)

## 2020-05-09 LAB — CBC WITH DIFFERENTIAL/PLATELET
Abs Immature Granulocytes: 0.01 10*3/uL (ref 0.00–0.07)
Basophils Absolute: 0.1 10*3/uL (ref 0.0–0.1)
Basophils Relative: 1 %
Eosinophils Absolute: 0.1 10*3/uL (ref 0.0–0.5)
Eosinophils Relative: 1 %
HCT: 35.6 % — ABNORMAL LOW (ref 36.0–46.0)
Hemoglobin: 10.9 g/dL — ABNORMAL LOW (ref 12.0–15.0)
Immature Granulocytes: 0 %
Lymphocytes Relative: 44 %
Lymphs Abs: 4.5 10*3/uL — ABNORMAL HIGH (ref 0.7–4.0)
MCH: 24.3 pg — ABNORMAL LOW (ref 26.0–34.0)
MCHC: 30.6 g/dL (ref 30.0–36.0)
MCV: 79.5 fL — ABNORMAL LOW (ref 80.0–100.0)
Monocytes Absolute: 0.5 10*3/uL (ref 0.1–1.0)
Monocytes Relative: 5 %
Neutro Abs: 5 10*3/uL (ref 1.7–7.7)
Neutrophils Relative %: 49 %
Platelets: 338 10*3/uL (ref 150–400)
RBC: 4.48 MIL/uL (ref 3.87–5.11)
RDW: 17.1 % — ABNORMAL HIGH (ref 11.5–15.5)
WBC: 10.1 10*3/uL (ref 4.0–10.5)
nRBC: 0 % (ref 0.0–0.2)

## 2020-05-09 LAB — COMPREHENSIVE METABOLIC PANEL
ALT: 15 U/L (ref 0–44)
AST: 18 U/L (ref 15–41)
Albumin: 3.4 g/dL — ABNORMAL LOW (ref 3.5–5.0)
Alkaline Phosphatase: 72 U/L (ref 38–126)
Anion gap: 9 (ref 5–15)
BUN: 14 mg/dL (ref 6–20)
CO2: 25 mmol/L (ref 22–32)
Calcium: 8.9 mg/dL (ref 8.9–10.3)
Chloride: 104 mmol/L (ref 98–111)
Creatinine, Ser: 0.95 mg/dL (ref 0.44–1.00)
GFR, Estimated: >60 mL/min (ref >60)
Glucose, Bld: 107 mg/dL — ABNORMAL HIGH (ref 70–99)
Potassium: 3.9 mmol/L (ref 3.5–5.1)
Sodium: 138 mmol/L (ref 135–145)
Total Bilirubin: 0.4 mg/dL (ref 0.3–1.2)
Total Protein: 6.5 g/dL (ref 6.5–8.1)

## 2020-05-09 LAB — PREGNANCY, URINE: Preg Test, Ur: NEGATIVE

## 2020-05-09 LAB — RESP PANEL BY RT-PCR (FLU A&B, COVID) ARPGX2
Influenza A by PCR: NEGATIVE
Influenza B by PCR: NEGATIVE
SARS Coronavirus 2 by RT PCR: NEGATIVE

## 2020-05-09 LAB — LIPASE, BLOOD: Lipase: 40 U/L (ref 11–51)

## 2020-05-09 SURGERY — LAPAROSCOPIC CHOLECYSTECTOMY WITH INTRAOPERATIVE CHOLANGIOGRAM
Anesthesia: General | Site: Abdomen

## 2020-05-09 MED ORDER — FENTANYL CITRATE (PF) 100 MCG/2ML IJ SOLN
INTRAMUSCULAR | Status: AC
  Filled 2020-05-09: qty 2

## 2020-05-09 MED ORDER — PROPOFOL 10 MG/ML IV BOLUS
INTRAVENOUS | Status: AC
  Filled 2020-05-09: qty 20

## 2020-05-09 MED ORDER — MORPHINE SULFATE (PF) 4 MG/ML IV SOLN
4.0000 mg | Freq: Once | INTRAVENOUS | Status: AC
  Administered 2020-05-09: 4 mg via INTRAVENOUS
  Filled 2020-05-09: qty 1

## 2020-05-09 MED ORDER — SODIUM CHLORIDE 0.9 % IV SOLN
INTRAVENOUS | Status: AC
  Filled 2020-05-09: qty 20

## 2020-05-09 MED ORDER — BUPIVACAINE HCL (PF) 0.5 % IJ SOLN
INTRAMUSCULAR | Status: DC | PRN
  Administered 2020-05-09: 20 mL

## 2020-05-09 MED ORDER — LIDOCAINE 2% (20 MG/ML) 5 ML SYRINGE
INTRAMUSCULAR | Status: AC
  Filled 2020-05-09: qty 5

## 2020-05-09 MED ORDER — ONDANSETRON HCL 4 MG/2ML IJ SOLN
INTRAMUSCULAR | Status: AC
  Filled 2020-05-09: qty 2

## 2020-05-09 MED ORDER — KETOROLAC TROMETHAMINE 30 MG/ML IJ SOLN
INTRAMUSCULAR | Status: AC
  Filled 2020-05-09: qty 1

## 2020-05-09 MED ORDER — OXYCODONE HCL 5 MG PO TABS
5.0000 mg | ORAL_TABLET | Freq: Once | ORAL | Status: AC | PRN
  Administered 2020-05-09: 5 mg via ORAL

## 2020-05-09 MED ORDER — EPHEDRINE SULFATE-NACL 50-0.9 MG/10ML-% IV SOSY
PREFILLED_SYRINGE | INTRAVENOUS | Status: DC | PRN
  Administered 2020-05-09: 5 mg via INTRAVENOUS

## 2020-05-09 MED ORDER — ONDANSETRON HCL 4 MG/2ML IJ SOLN
INTRAMUSCULAR | Status: DC | PRN
  Administered 2020-05-09: 4 mg via INTRAVENOUS

## 2020-05-09 MED ORDER — ROCURONIUM BROMIDE 10 MG/ML (PF) SYRINGE
PREFILLED_SYRINGE | INTRAVENOUS | Status: DC | PRN
  Administered 2020-05-09: 60 mg via INTRAVENOUS

## 2020-05-09 MED ORDER — KETOROLAC TROMETHAMINE 30 MG/ML IJ SOLN
30.0000 mg | Freq: Once | INTRAMUSCULAR | Status: AC | PRN
  Administered 2020-05-09: 30 mg via INTRAVENOUS

## 2020-05-09 MED ORDER — TRAMADOL HCL 50 MG PO TABS: 50.0000 mg | ORAL_TABLET | Freq: Four times a day (QID) | ORAL | 0 refills | Status: DC | PRN

## 2020-05-09 MED ORDER — ONDANSETRON HCL 4 MG/2ML IJ SOLN: 4.0000 mg | Freq: Once | INTRAMUSCULAR | Status: DC | PRN

## 2020-05-09 MED ORDER — HYDROMORPHONE HCL 1 MG/ML IJ SOLN
INTRAMUSCULAR | Status: AC
  Filled 2020-05-09: qty 1

## 2020-05-09 MED ORDER — SODIUM CHLORIDE 0.9 % IV SOLN
2.0000 g | INTRAVENOUS | Status: AC
  Administered 2020-05-09: 2 g via INTRAVENOUS
  Filled 2020-05-09: qty 20

## 2020-05-09 MED ORDER — SODIUM CHLORIDE 0.9 % IV BOLUS
500.0000 mL | Freq: Once | INTRAVENOUS | Status: AC
  Administered 2020-05-09: 500 mL via INTRAVENOUS

## 2020-05-09 MED ORDER — FENTANYL CITRATE (PF) 250 MCG/5ML IJ SOLN
INTRAMUSCULAR | Status: DC | PRN
  Administered 2020-05-09 (×4): 50 ug via INTRAVENOUS

## 2020-05-09 MED ORDER — DEXAMETHASONE SODIUM PHOSPHATE 10 MG/ML IJ SOLN
INTRAMUSCULAR | Status: AC
  Filled 2020-05-09: qty 1

## 2020-05-09 MED ORDER — MIDAZOLAM HCL 5 MG/5ML IJ SOLN
INTRAMUSCULAR | Status: DC | PRN
  Administered 2020-05-09: 2 mg via INTRAVENOUS

## 2020-05-09 MED ORDER — OXYCODONE HCL 5 MG PO TABS
ORAL_TABLET | ORAL | Status: AC
  Filled 2020-05-09: qty 1

## 2020-05-09 MED ORDER — OXYCODONE HCL 5 MG/5ML PO SOLN
5.0000 mg | Freq: Once | ORAL | Status: AC | PRN
Start: 2020-05-09 — End: 2020-05-09

## 2020-05-09 MED ORDER — ONDANSETRON HCL 4 MG/2ML IJ SOLN
4.0000 mg | Freq: Four times a day (QID) | INTRAMUSCULAR | Status: DC | PRN
  Administered 2020-05-09: 4 mg via INTRAVENOUS
  Filled 2020-05-09: qty 2

## 2020-05-09 MED ORDER — MIDAZOLAM HCL 2 MG/2ML IJ SOLN
INTRAMUSCULAR | Status: AC
  Filled 2020-05-09: qty 2

## 2020-05-09 MED ORDER — HYDROMORPHONE HCL 1 MG/ML IJ SOLN
0.2500 mg | INTRAMUSCULAR | Status: DC | PRN
  Administered 2020-05-09 (×2): 0.5 mg via INTRAVENOUS

## 2020-05-09 MED ORDER — BUPIVACAINE HCL (PF) 0.5 % IJ SOLN
INTRAMUSCULAR | Status: AC
  Filled 2020-05-09: qty 30

## 2020-05-09 MED ORDER — LACTATED RINGERS IV SOLN: INTRAVENOUS | Status: DC

## 2020-05-09 MED ORDER — SODIUM CHLORIDE 0.9 % IV SOLN: INTRAVENOUS | Status: DC

## 2020-05-09 MED ORDER — ROCURONIUM BROMIDE 10 MG/ML (PF) SYRINGE
PREFILLED_SYRINGE | INTRAVENOUS | Status: AC
  Filled 2020-05-09: qty 10

## 2020-05-09 MED ORDER — ACETAMINOPHEN 500 MG PO TABS
1000.0000 mg | ORAL_TABLET | ORAL | Status: AC
  Administered 2020-05-09: 1000 mg via ORAL
  Filled 2020-05-09: qty 2

## 2020-05-09 MED ORDER — LIDOCAINE 2% (20 MG/ML) 5 ML SYRINGE
INTRAMUSCULAR | Status: DC | PRN
  Administered 2020-05-09: 80 mg via INTRAVENOUS

## 2020-05-09 MED ORDER — ONDANSETRON HCL 4 MG/2ML IJ SOLN
4.0000 mg | Freq: Once | INTRAMUSCULAR | Status: AC
  Administered 2020-05-09: 4 mg via INTRAVENOUS
  Filled 2020-05-09: qty 2

## 2020-05-09 MED ORDER — EPHEDRINE 5 MG/ML INJ
INTRAVENOUS | Status: AC
  Filled 2020-05-09: qty 10

## 2020-05-09 MED ORDER — SODIUM CHLORIDE 0.9 % IV BOLUS
1000.0000 mL | Freq: Once | INTRAVENOUS | Status: AC
Start: 2020-05-09 — End: 2020-05-09
  Administered 2020-05-09: 1000 mL via INTRAVENOUS

## 2020-05-09 MED ORDER — PROPOFOL 10 MG/ML IV BOLUS
INTRAVENOUS | Status: DC | PRN
  Administered 2020-05-09: 170 mg via INTRAVENOUS

## 2020-05-09 MED ORDER — SUGAMMADEX SODIUM 200 MG/2ML IV SOLN
INTRAVENOUS | Status: DC | PRN
  Administered 2020-05-09: 200 mg via INTRAVENOUS

## 2020-05-09 MED ORDER — MORPHINE SULFATE (PF) 2 MG/ML IV SOLN
2.0000 mg | INTRAVENOUS | Status: DC | PRN
  Administered 2020-05-09: 2 mg via INTRAVENOUS
  Filled 2020-05-09: qty 1

## 2020-05-09 MED ORDER — DEXAMETHASONE SODIUM PHOSPHATE 10 MG/ML IJ SOLN
INTRAMUSCULAR | Status: DC | PRN
  Administered 2020-05-09: 8 mg via INTRAVENOUS

## 2020-05-09 SURGICAL SUPPLY — 33 items
ADH SKN CLS APL DERMABOND .7 (GAUZE/BANDAGES/DRESSINGS) ×1
APL PRP STRL LF DISP 70% ISPRP (MISCELLANEOUS) ×1
APPLIER CLIP 5 13 M/L LIGAMAX5 (MISCELLANEOUS) ×2
APR CLP MED LRG 5 ANG JAW (MISCELLANEOUS) ×1
BAG SPEC RTRVL LRG 6X4 10 (ENDOMECHANICALS) ×1
CABLE HIGH FREQUENCY MONO STRZ (ELECTRODE) ×2 IMPLANT
CHLORAPREP W/TINT 26 (MISCELLANEOUS) ×2 IMPLANT
CLIP APPLIE 5 13 M/L LIGAMAX5 (MISCELLANEOUS) ×1 IMPLANT
COVER MAYO STAND STRL (DRAPES) IMPLANT
COVER WAND RF STERILE (DRAPES) IMPLANT
DECANTER SPIKE VIAL GLASS SM (MISCELLANEOUS) ×2 IMPLANT
DERMABOND ADVANCED (GAUZE/BANDAGES/DRESSINGS) ×1
DERMABOND ADVANCED .7 DNX12 (GAUZE/BANDAGES/DRESSINGS) ×1 IMPLANT
DRAPE C-ARM 42X120 X-RAY (DRAPES) IMPLANT
ELECT REM PT RETURN 15FT ADLT (MISCELLANEOUS) ×2 IMPLANT
GLOVE SURG ENC MOIS LTX SZ7.5 (GLOVE) ×2 IMPLANT
GOWN STRL REUS W/TWL XL LVL3 (GOWN DISPOSABLE) ×6 IMPLANT
HEMOSTAT SURGICEL 4X8 (HEMOSTASIS) IMPLANT
KIT BASIN OR (CUSTOM PROCEDURE TRAY) ×2 IMPLANT
KIT TURNOVER KIT A (KITS) ×2 IMPLANT
PENCIL SMOKE EVACUATOR (MISCELLANEOUS) IMPLANT
POUCH SPECIMEN RETRIEVAL 10MM (ENDOMECHANICALS) ×2 IMPLANT
SCISSORS LAP 5X35 DISP (ENDOMECHANICALS) ×2 IMPLANT
SET CHOLANGIOGRAPH MIX (MISCELLANEOUS) IMPLANT
SET IRRIG TUBING LAPAROSCOPIC (IRRIGATION / IRRIGATOR) ×2 IMPLANT
SET TUBE SMOKE EVAC HIGH FLOW (TUBING) ×2 IMPLANT
SLEEVE XCEL OPT CAN 5 100 (ENDOMECHANICALS) ×4 IMPLANT
SUT MNCRL AB 4-0 PS2 18 (SUTURE) ×2 IMPLANT
TOWEL OR 17X26 10 PK STRL BLUE (TOWEL DISPOSABLE) ×2 IMPLANT
TOWEL OR NON WOVEN STRL DISP B (DISPOSABLE) ×2 IMPLANT
TRAY LAPAROSCOPIC (CUSTOM PROCEDURE TRAY) ×2 IMPLANT
TROCAR BLADELESS OPT 5 100 (ENDOMECHANICALS) ×2 IMPLANT
TROCAR XCEL BLUNT TIP 100MML (ENDOMECHANICALS) ×2 IMPLANT

## 2020-05-09 NOTE — ED Triage Notes (Signed)
Recent pregnancy/delivery ~1 month ago. States known hx of gallstones. Woke up at 0230 with epigastric pain and vomiting x 1. Denies fever.

## 2020-05-09 NOTE — ED Provider Notes (Signed)
30 year old female arrives via transfer from [facility] for ultrasound of her right upper quadrant to rule out cholecystitis.  Patient developed right upper quadrant pain around 2:30 AM associated with 1 episode of emesis, she is 1 month postpartum following a vaginal delivery and is currently breast-feeding.  Patient was diagnosed with gallstones during her pregnancy.  Patient found to have focal tenderness in the right upper quadrant along with a positive Murphy sign, her labs were reassuring.  She is currently awaiting right upper quadrant ultrasound. Physical Exam  BP (!) 111/57   Pulse (!) 53   Temp 98.3 F (36.8 C) (Oral)   Resp 17   Ht 5\' 8"  (1.727 m)   Wt 103.9 kg   LMP 07/23/2019 (Approximate) Comment: Gravida 2, Para 1  SpO2 99%   Breastfeeding Yes   BMI 34.82 kg/m   Physical Exam Constitutional:      General: She is not in acute distress.    Appearance: Normal appearance. She is well-developed. She is not ill-appearing or diaphoretic.  HENT:     Head: Normocephalic and atraumatic.  Eyes:     General: Vision grossly intact. Gaze aligned appropriately.     Pupils: Pupils are equal, round, and reactive to light.  Neck:     Trachea: Trachea and phonation normal.  Pulmonary:     Effort: Pulmonary effort is normal. No respiratory distress.  Abdominal:     General: There is no distension.     Palpations: Abdomen is soft.     Tenderness: There is abdominal tenderness in the right upper quadrant. There is no guarding or rebound.  Musculoskeletal:        General: Normal range of motion.     Cervical back: Normal range of motion.  Skin:    General: Skin is warm and dry.  Neurological:     Mental Status: She is alert.     GCS: GCS eye subscore is 4. GCS verbal subscore is 5. GCS motor subscore is 6.     Comments: Speech is clear and goal oriented, follows commands Major Cranial nerves without deficit, no facial droop Moves extremities without ataxia, coordination  intact  Psychiatric:        Behavior: Behavior normal.     ED Course/Procedures      Procedures  MDM  Additional history obtained from: 1. Nursing notes from this visit. --------------------- I reviewed and interpreted labs which include: COVID/influenza panel negative. Lipase within normal limits. Pregnancy test negative. Urinalysis within normal limits. CMP shows no emergent electrolyte derangement, AKI, LFT elevations or gap. CBC shows anemia of 10.9, no leukocytosis or thrombocytopenia  RUQ 07/25/2019:  IMPRESSION:  1. Cholelithiasis. No evidence of acute cholecystitis.  2. Borderline biliary ductal dilatation. Recommend correlation with  serologies and if abnormal, MRCP could be obtained. This  recommendation follows ACR consensus guidelines: White Paper of the  ACR Incidental Findings Committee II on Gallbladder and Biliary  Findings. J Am Coll Radiol [phone].  ----- 7:33 AM: Consult with gastroenterologist Dr. [id number] who advises consult to general surgery and they can perform intraoperative cholangiogram if they feel it is necessary.  Does not feel other GI involvement indicated at this time. - 7:55 AM: Consult with general surgery, spoke with [name] who will be by to evaluate the patient. - Patient reassessed resting comfortably in bed no acute distress, reports she is feeling okay at this time no questions or concerns. - Patient seen and evaluated by general surgery they plan  for laparoscopic cholecystectomy later on today.  On reassessment patient remains stable no acute distress she has no questions or concerns.     Note: Portions of this report may have been transcribed using voice recognition software. Every effort was made to ensure accuracy; however, inadvertent computerized transcription errors may still be present.   Elizabeth Palau 05/09/20 1017    Bethann Berkshire, MD 05/10/20 [phone]

## 2020-05-09 NOTE — ED Notes (Signed)
Pt ambulated to bathroom 

## 2020-05-09 NOTE — Anesthesia Procedure Notes (Signed)
Procedure Name: Intubation Date/Time: 05/09/2020 11:10 AM Performed by: Victoriano Lain, CRNA Pre-anesthesia Checklist: Patient identified, Emergency Drugs available, Suction available, Patient being monitored and Timeout performed Patient Re-evaluated:Patient Re-evaluated prior to induction Oxygen Delivery Method: Circle system utilized Preoxygenation: Pre-oxygenation with 100% oxygen Induction Type: IV induction Ventilation: Mask ventilation without difficulty Laryngoscope Size: Mac and 4 Grade View: Grade I Tube type: Oral Tube size: 7.0 mm Number of attempts: 1 Airway Equipment and Method: Stylet Placement Confirmation: ETT inserted through vocal cords under direct vision,  positive ETCO2 and breath sounds checked- equal and bilateral Secured at: 21 cm Tube secured with: Tape Dental Injury: Teeth and Oropharynx as per pre-operative assessment

## 2020-05-09 NOTE — Discharge Instructions (Signed)
[facility] ______[facility] LAPAROSCOPIC SURGERY: POST OP INSTRUCTIONS Always review your discharge instruction sheet given to you by the facility where your surgery was performed. IF YOU HAVE DISABILITY OR FAMILY LEAVE FORMS, YOU MUST BRING THEM TO THE OFFICE FOR PROCESSING.   DO NOT GIVE THEM TO YOUR DOCTOR.  1. A prescription for pain medication may be given to you upon discharge.  Take your pain medication as prescribed, if needed.  If narcotic pain medicine is not needed, then you may take acetaminophen (Tylenol) or ibuprofen (Advil) as needed. 2. Take your usually prescribed medications unless otherwise directed. 3. If you need a refill on your pain medication, please contact your pharmacy.  They will contact our office to request authorization. Prescriptions will not be filled after 5pm or on week-ends. 4. You should follow a light diet the first few days after arrival home, such as soup and crackers, etc.  Be sure to include lots of fluids daily. 5. Most patients will experience some swelling and bruising in the area of the incisions.  Ice packs will help.  Swelling and bruising can take several days to resolve.  6. It is common to experience some constipation if taking pain medication after surgery.  Increasing fluid intake and taking a stool softener (such as Colace) will usually help or prevent this problem from occurring.  A mild laxative (Milk of Magnesia or Miralax) should be taken according to package instructions if there are no bowel movements after 48 hours. 7. Unless discharge instructions indicate otherwise, you may remove your bandages 24-48 hours after surgery, and you may shower at that time.  You may have steri-strips (small skin tapes) in place directly over the incision.  These strips should be left on the skin for 7-10 days.  If your surgeon used skin glue on the incision, you may shower in 24 hours.  The glue will flake off over the next 2-3 weeks.  Any sutures or  staples will be removed at the office during your follow-up visit. 8. ACTIVITIES:  You may resume regular (light) daily activities beginning the next day--such as daily self-care, walking, climbing stairs--gradually increasing activities as tolerated.  You may have sexual intercourse when it is comfortable.  Refrain from any heavy lifting or straining until approved by your doctor. a. You may drive when you are no longer taking prescription pain medication, you can comfortably wear a seatbelt, and you can safely maneuver your car and apply brakes. b. RETURN TO WORK:  __________________________________________________________ 9. You should see your doctor in the office for a follow-up appointment approximately 2-3 weeks after your surgery.  Make sure that you call for this appointment within a day or two after you arrive home to insure a convenient appointment time. 10. OTHER INSTRUCTIONS:OK TO SHOWER STARTING TOMORROW 11. ICE PACK, TYLENOL, AND IBUPROFEN ALSO FOR PAIN 12. NO LIFTING MORE THAN 15 TO 20 POUNDS FOR 2 WEEKS __________________________________________________________________________________________________________________________ __________________________________________________________________________________________________________________________ WHEN TO CALL YOUR DOCTOR: 1. Fever over 101.0 2. Inability to urinate 3. Continued bleeding from incision. 4. Increased pain, redness, or drainage from the incision. 5. Increasing abdominal pain  The clinic staff is available to answer your questions during regular business hours.  Please don't hesitate to call and ask to speak to one of the nurses for clinical concerns.  If you have a medical emergency, go to the nearest emergency room or call 911.  A surgeon from [facility] is always on call at the hospital. [address]  [address], [place], [place]  [address] ? [address] [address], [place], [place]   [id number] [phone] ?  [phone] ? FAX [phone] Web site: [email or web address]   Gallbladder Eating Plan If you have a gallbladder condition, you may have trouble digesting fats. Eating a low-fat diet can help reduce your symptoms, and may be helpful before and after having surgery to remove your gallbladder (cholecystectomy). Your health care provider may recommend that you work with a diet and nutrition specialist (dietitian) to help you reduce the amount of fat in your diet. What are tips for following this plan? General guidelines  Limit your fat intake to less than 30% of your total daily calories. If you eat around 1,800 calories each day, this is less than 60 grams (g) of fat per day.  Fat is an important part of a healthy diet. Eating a low-fat diet can make it hard to maintain a healthy body weight. Ask your dietitian how much fat, calories, and other nutrients you need each day.  Eat small, frequent meals throughout the day instead of three large meals.  Drink at least 8-10 cups of fluid a day. Drink enough fluid to keep your urine clear or pale yellow.  Limit alcohol intake to no more than 1 drink a day for nonpregnant women and 2 drinks a day for men. One drink equals 12 oz of beer, 5 oz of wine, or 1 oz of hard liquor. Reading food labels  Check Nutrition Facts on food labels for the amount of fat per serving. Choose foods with less than 3 grams of fat per serving.   Shopping  Choose nonfat and low-fat healthy foods. Look for the words "nonfat," "low fat," or "fat free."  Avoid buying processed or prepackaged foods. Cooking  Cook using low-fat methods, such as baking, broiling, grilling, or boiling.  Cook with small amounts of healthy fats, such as olive oil, grapeseed oil, canola oil, or sunflower oil. What foods are recommended?  All fresh, frozen, or canned fruits and vegetables.  Whole grains.  Low-fat or non-fat (skim) milk and yogurt.  Lean meat, skinless  poultry, fish, eggs, and beans.  Low-fat protein supplement powders or drinks.  Spices and herbs. What foods are not recommended?  High-fat foods. These include baked goods, fast food, fatty cuts of meat, ice cream, french toast, sweet rolls, pizza, cheese bread, foods covered with butter, creamy sauces, or cheese.  Fried foods. These include french fries, tempura, battered fish, breaded chicken, fried breads, and sweets.  Foods with strong odors.  Foods that cause bloating and gas. Summary  A low-fat diet can be helpful if you have a gallbladder condition, or before and after gallbladder surgery.  Limit your fat intake to less than 30% of your total daily calories. This is about 60 g of fat if you eat 1,800 calories each day.  Eat small, frequent meals throughout the day instead of three large meals. This information is not intended to replace advice given to you by your health care provider. Make sure you discuss any questions you have with your health care provider. Document Revised: 08/13/2019 Document Reviewed: 08/13/2019 Elsevier Patient Education  2021 [employer].

## 2020-05-09 NOTE — ED Notes (Signed)
Report called to Jake RN at [facility].   

## 2020-05-09 NOTE — Anesthesia Postprocedure Evaluation (Signed)
Anesthesia Post Note  Patient: Gem Conkle  Procedure(s) Performed: LAPAROSCOPIC CHOLECYSTECTOMY WITH INTRAOPERATIVE CHOLANGIOGRAM (N/A Abdomen)     Patient location during evaluation: PACU Anesthesia Type: General Level of consciousness: awake and alert Pain management: pain level controlled Vital Signs Assessment: post-procedure vital signs reviewed and stable Respiratory status: spontaneous breathing, nonlabored ventilation, respiratory function stable and patient connected to nasal cannula oxygen Cardiovascular status: blood pressure returned to baseline and stable Postop Assessment: no apparent nausea or vomiting Anesthetic complications: no   No complications documented.  Last Vitals:  Vitals:   05/09/20 1245 05/09/20 1302  BP: 107/61 112/73  Pulse: (!) 54 64  Resp: 12 12  Temp:  37 C  SpO2: 95% 98%    Last Pain:  Vitals:   05/09/20 1302  TempSrc:   PainSc: 4                  Baylea Milburn S

## 2020-05-09 NOTE — H&P (Addendum)
Tonna Boehringer 06-Apr-1990  [id number].    Requesting MD: Dr. Colon Flattery, PA-C Chief Complaint/Reason for Consult: RUQ abdominal pain  HPI: Jordan Chung is a 30 y.o. female who is 1 month PP after SVD of baby boy who is currently breastfeeding who presents with right upper quadrant abdominal pain, nausea and vomiting.  Patient reports that she awoke around 2:30 AM this morning with acute onset of right upper quadrant abdominal pain with radiation to her back.  She reports the pain is constant, gradually worsening associated with nausea and vomiting.  She denies history of similar pain in the past.  She is unsure if it is worse with oral intake.  Denies associated fever, chills, chest pain, shortness of breath, cough, or urinary symptoms.  She presented to the ED as found to have cholelithiasis on ultrasound. No Cholecystitis. There was borderline biliary ductal dilation at 7.3 mm but lipase and LFTs were within normal limits.  WBC 10.1.  EDP discussed with Dr. [name] who did not recommend any GI procedures or further imaging to evaluate for choledocholithiasis at this time.  We were asked to see.  Patient reports that pain is improved to a 7/10 after IV pain medication.   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  Respiratory: Negative for cough and shortness of breath.   Cardiovascular: Negative for chest pain and leg swelling.  Gastrointestinal: Positive for abdominal pain, nausea and vomiting. Negative for constipation.  Genitourinary: Negative for dysuria.  Musculoskeletal: Positive for back pain.  Psychiatric/Behavioral: Negative for substance abuse.  All other systems reviewed and are negative.   No family history on file.  Past Medical History:  Diagnosis Date  . Hyperemesis gravidarum   . Postpartum depression     History  reviewed. No pertinent surgical history.  Social History:  reports that she has never smoked. She has never used smokeless tobacco. She reports that she does not drink alcohol and does not use drugs.  Allergies: No Known Allergies  (Not in a hospital admission)    Physical Exam: Blood pressure (!) 111/57, pulse (!) 53, temperature 98.3 F (36.8 C), temperature source Oral, resp. rate 17, height 5\' 8"  (1.727 m), weight 103.9 kg, last menstrual period 07/23/2019, SpO2 99 %, currently breastfeeding. General: pleasant, WD/WN white female who is laying in bed in NAD HEENT: head is normocephalic, atraumatic.  Sclera are noninjected.  PERRL.  Ears and nose without any masses or lesions.  Mouth is pink and moist. Dentition fair Heart: regular, rate, and rhythm.  Normal s1,s2. No obvious murmurs, gallops, or rubs noted.  Palpable pedal pulses bilaterally  Lungs: CTAB, no wheezes, rhonchi, or rales noted.  Respiratory effort nonlabored Abd: Soft, ND, tenderness of the right abdomen greatest in the RUQ with + Murphy's sign. +BS. No masses, hernias, or organomegaly MS: no BUE/BLE edema, calves soft and nontender Skin: warm and dry with no masses, lesions, or rashes Psych: A&Ox4 with an appropriate affect Neuro: cranial nerves grossly intact, equal strength in BUE/BLE bilaterally, normal speech, thought process intact, moves all extremities, gait not assessed.   Results for orders placed or performed during the hospital encounter of 05/09/20 (from the past 48 hour(s))  Comprehensive metabolic panel     Status: Abnormal   Collection Time: 05/09/20  4:28 AM  Result Value Ref Range   Sodium 138 135 - 145  mmol/L    Comment: Performed at [facility], [address] [place], [place] [address]   Potassium 3.9 3.5 - 5.1 mmol/L    Comment: Performed at [facility], [address] [place], [place] [address]   Chloride 104 98 - 111 mmol/L    Comment: Performed at [facility], [address] [place], [place] [address]   CO2 25 22 - 32 mmol/L    Comment: Performed at [facility], [address] [place], [place] [address]   Glucose, Bld 107 (H) 70 - 99 mg/dL    Comment: Glucose reference range applies only to samples taken after fasting for at least 8 hours. Performed at [facility], [address] [place], [place] [address]    BUN 14 6 - 20 mg/dL    Comment: Performed at [facility], [address], [place], [place] [address]   Creatinine, Ser 0.95 0.44 - 1.00 mg/dL   Calcium 8.9 8.9 - 50.3 mg/dL   Total Protein 6.5 6.5 - 8.1 g/dL   Albumin 3.4 (L) 3.5 - 5.0 g/dL   AST 18 15 - 41 U/L   ALT 15 0 - 44 U/L   Alkaline Phosphatase 72 38 - 126 U/L   Total Bilirubin 0.4 0.3 - 1.2 mg/dL   GFR, Estimated >54 >65 mL/min    Comment: (NOTE) Calculated using the CKD-EPI Creatinine Equation (2021)    Anion gap 9 5 - 15    Comment: Performed at [facility], [address] [place], [place] [address]  CBC with Differential     Status: Abnormal   Collection Time: 05/09/20  4:28 AM  Result Value Ref Range   WBC 10.1 4.0 - 10.5 K/uL   RBC 4.48 3.87 - 5.11 MIL/uL   Hemoglobin 10.9 (L) 12.0 - 15.0 g/dL   HCT 51.7 (L) 00.1 - 74.9 %   MCV 79.5 (L) 80.0 - 100.0 fL   MCH 24.3 (L) 26.0 - 34.0 pg   MCHC 30.6 30.0 - 36.0 g/dL   RDW 44.9 (H) 67.5 - 91.6 %   Platelets 338 150 - 400 K/uL   nRBC 0.0 0.0 - 0.2 %   Neutrophils Relative % 49 %   Neutro Abs 5.0 1.7 - 7.7 K/uL   Lymphocytes Relative 44 %   Lymphs Abs 4.5 (H) 0.7 - 4.0 K/uL   Monocytes Relative 5 %   Monocytes Absolute 0.5 0.1 - 1.0 K/uL   Eosinophils Relative 1 %   Eosinophils Absolute 0.1 0.0 - 0.5 K/uL   Basophils Relative 1 %   Basophils Absolute 0.1 0.0 - 0.1 K/uL   Immature Granulocytes 0 %   Abs Immature Granulocytes 0.01 0.00 - 0.07 K/uL    Comment: Performed at [facility], [address] [place], [place] [address]  Lipase, blood     Status: None   Collection Time: 05/09/20  4:28 AM  Result Value Ref Range   Lipase 40 11 - 51 U/L    Comment: Performed at [facility], [address] [place], [place] [address]  Urinalysis, Routine w reflex microscopic Urine, Clean Catch     Status: None   Collection Time: 05/09/20  4:28 AM  Result Value Ref Range   Color, Urine YELLOW YELLOW   APPearance CLEAR CLEAR   Specific Gravity, Urine  1.020 1.005 - 1.030   pH 6.0 5.0 - 8.0   Glucose, UA NEGATIVE NEGATIVE mg/dL   Hgb urine dipstick NEGATIVE NEGATIVE   Bilirubin Urine NEGATIVE NEGATIVE   Ketones, ur NEGATIVE NEGATIVE mg/dL   Protein, ur NEGATIVE NEGATIVE mg/dL   Nitrite NEGATIVE NEGATIVE   Leukocytes,Ua NEGATIVE NEGATIVE    Comment: Microscopic not done on urines with negative protein, blood, leukocytes, nitrite, or glucose < 500 mg/dL. Performed at [facility], [address] [place], [place] [address]   Pregnancy, urine     Status: None   Collection Time: 05/09/20  4:28 AM  Result Value Ref Range   Preg Test, Ur NEGATIVE NEGATIVE    Comment:        THE SENSITIVITY OF THIS METHODOLOGY IS >20 mIU/mL. Performed at [facility], [address] [place], [place] [address]   Resp Panel by RT-PCR (Flu A&B, Covid) Nasopharyngeal Swab     Status: None   Collection Time: 05/09/20  4:28 AM   Specimen: Nasopharyngeal Swab; Nasopharyngeal(NP) swabs in vial transport medium  Result Value Ref Range   SARS Coronavirus 2 by RT PCR NEGATIVE NEGATIVE    Comment: (NOTE) SARS-CoV-2 target nucleic acids are NOT DETECTED.  The SARS-CoV-2 RNA is generally detectable in upper respiratory specimens during the acute phase of infection. The lowest concentration of SARS-CoV-2 viral copies this assay can detect is 138 copies/mL. A negative result does not preclude SARS-Cov-2 infection and should not be used as the sole basis for treatment or other patient management  decisions. A negative result may occur with  improper specimen collection/handling, submission of specimen other than nasopharyngeal swab, presence of viral mutation(s) within the areas targeted by this assay, and inadequate number of viral copies(<138 copies/mL). A negative result must be combined with clinical observations, patient history, and epidemiological information. The expected result is Negative.  Fact Sheet for Patients:  BloggerCourse.comhttps://www.fda.gov/media/152166/download  Fact Sheet for Healthcare Providers:  SeriousBroker.ithttps://www.fda.gov/media/152162/download  This test is no t yet approved or cleared by the [place] FDA and  has been authorized for detection and/or diagnosis of SARS-CoV-2 by FDA under an Emergency Use Authorization (EUA). This EUA will remain  in effect (meaning this test can be used) for the duration of the COVID-19 declaration under Section 564(b)(1) of the Act, 21 U.S.C.section 360bbb-3(b)(1), unless the authorization is terminated  or revoked sooner.       Influenza A by PCR NEGATIVE NEGATIVE   Influenza B by PCR NEGATIVE NEGATIVE    Comment: (NOTE) The Xpert Xpress SARS-CoV-2/FLU/RSV plus assay is intended as an aid in the diagnosis of influenza from Nasopharyngeal swab specimens and should not be used as a sole basis for treatment. Nasal washings and aspirates are unacceptable for Xpert Xpress SARS-CoV-2/FLU/RSV testing.  Fact Sheet for Patients: BloggerCourse.comhttps://www.fda.gov/media/152166/download  Fact Sheet for Healthcare Providers: SeriousBroker.ithttps://www.fda.gov/media/152162/download  This test is not yet approved or cleared by the [place] FDA and has been authorized for detection and/or diagnosis of SARS-CoV-2 by FDA under an Emergency Use Authorization (EUA). This EUA will remain in effect (meaning this test can be used) for the duration of the COVID-19 declaration under Section 564(b)(1) of the Act, 21 U.S.C. section 360bbb-3(b)(1), unless the authorization  is terminated or revoked.  Performed at [facility], [address] [place], [place] [address]    US Abdomen Limited RUQ (LIVER/GB)  Result Date: 05/09/2020 CLINICAL DATA:  Acute right upper quadrant pain EXAM: ULTRASOUND ABDOMEN LIMITED  RIGHT UPPER QUADRANT COMPARISON:  CT 03/27/2019, ultrasound 03/25/2019 FINDINGS: Gallbladder: Echogenic, shadowing gallstone seen towards the gallbladder neck measuring to 1.8 cm in diameter. Gallbladder wall thickness is within normal limits. No pericholecystic fluid or inflammation. Sonographic Eulah Pont sign is reportedly negative. Common bile duct: Diameter: 7.3 mm, borderline dilated. No visible intraductal gallstones though portions of the distal common bile duct are poorly visualized. Liver: No focal lesion identified. Within normal limits in parenchymal echogenicity. Portal vein is patent on color Doppler imaging with normal direction of blood flow towards the liver. Other: None. IMPRESSION: 1. Cholelithiasis.  No evidence of acute cholecystitis. 2. Borderline biliary ductal dilatation. Recommend correlation with serologies and if abnormal, MRCP could be obtained. This recommendation follows ACR consensus guidelines: White Paper of the ACR Incidental Findings Committee II on Gallbladder and Biliary Findings. J Am Coll Radiol 2013;10:953-956. Electronically Signed   By: Kreg Shropshire M.D.   On: 05/09/2020 06:53   Anti-infectives (From admission, onward)   Start     Dose/Rate Route Frequency Ordered Stop   05/09/20 1000  cefTRIAXone (ROCEPHIN) 2 g in sodium chloride 0.9 % 100 mL IVPB        2 g 200 mL/hr over 30 Minutes Intravenous On call to O.R. 05/09/20 0946 05/10/20 0559        Assessment/Plan 1 month PP after SVD of baby boy who is currently breastfeeding - discussed she will have to pump and dump  Symptomatic Cholelithiasis, possible early Cholecystitis  I have explained the procedure, risks, and aftercare of Laparoscopic cholecystectomy  (with possible IOC).  Risks include but are not limited to anesthesia (MI, CVA, death), bleeding, infection, wound problems, hernia, bile leak, injury to common bile duct/liver/intestine and diarrhea post op.  She seems to understand and agrees to proceed. Admit to outpatient. IV abx ordered. Keep NPO.  FEN - NPO, IVF VTE - SCDs ID - Rocephin  Jacinto Halim, [facility] 05/09/2020, 9:47 AM Please see Amion for pager number during day hours 7:00am-4:30pm\

## 2020-05-09 NOTE — Transfer of Care (Signed)
Immediate Anesthesia Transfer of Care Note  Patient: Jordan Chung  Procedure(s) Performed: LAPAROSCOPIC CHOLECYSTECTOMY WITH INTRAOPERATIVE CHOLANGIOGRAM (N/A Abdomen)  Patient Location: PACU  Anesthesia Type:General  Level of Consciousness: awake, alert , oriented and patient cooperative  Airway & Oxygen Therapy: Patient Spontanous Breathing and Patient connected to face mask oxygen  Post-op Assessment: Report given to RN, Post -op Vital signs reviewed and stable and Patient moving all extremities  Post vital signs: Reviewed and stable  Last Vitals:  Vitals Value Taken Time  BP    Temp    Pulse 69 05/09/20 1200  Resp 17 05/09/20 1200  SpO2 100 % 05/09/20 1200  Vitals shown include unvalidated device data.  Last Pain:  Vitals:   05/09/20 0702  TempSrc: Oral  PainSc:          Complications: No complications documented.

## 2020-05-09 NOTE — Op Note (Signed)
Laparoscopic Cholecystectomy Procedure Note  Indications: This patient presents with symptomatic gallbladder disease and will undergo laparoscopic cholecystectomy.  Pre-operative Diagnosis: acute cholecystitis with cholelithiasis  Post-operative Diagnosis: Same  Surgeon: Abigail Miyamoto   Assistants: Hedda Slade, PA  Anesthesia: General endotracheal anesthesia  ASA Class: 1  Procedure Details  The patient was seen again in the Holding Room. The risks, benefits, complications, treatment options, and expected outcomes were discussed with the patient. The possibilities of reaction to medication, pulmonary aspiration, perforation of viscus, bleeding, recurrent infection, finding a normal gallbladder, the need for additional procedures, failure to diagnose a condition, the possible need to convert to an open procedure, and creating a complication requiring transfusion or operation were discussed with the patient. The likelihood of improving the patient's symptoms with return to their baseline status is good.  The patient and/or family concurred with the proposed plan, giving informed consent. The site of surgery properly noted. The patient was taken to Operating Room, identified as Jordan Chung and the procedure verified as Laparoscopic Cholecystectomy with Intraoperative Cholangiogram. A Time Out was held and the above information confirmed.  Prior to the induction of general anesthesia, antibiotic prophylaxis was administered. General endotracheal anesthesia was then administered and tolerated well. After the induction, the abdomen was prepped with Chloraprep and draped in sterile fashion. The patient was positioned in the supine position.  Local anesthetic agent was injected into the skin near the umbilicus and an incision made. We dissected down to the abdominal fascia with blunt dissection.  The fascia was incised vertically and we entered the peritoneal cavity bluntly.  A pursestring suture of  0-Vicryl was placed around the fascial opening.  The Hasson cannula was inserted and secured with the stay suture.  Pneumoperitoneum was then created with CO2 and tolerated well without any adverse changes in the patient's vital signs. A 5-mm port was placed in the subxiphoid position.  Two 5-mm ports were placed in the right upper quadrant. All skin incisions were infiltrated with a local anesthetic agent before making the incision and placing the trocars.   We positioned the patient in reverse Trendelenburg, tilted slightly to the patient's left.  The gallbladder was identified, the fundus grasped and retracted cephalad. Adhesions were lysed bluntly and with the electrocautery where indicated, taking care not to injure any adjacent organs or viscus. The infundibulum was grasped and retracted laterally, exposing the peritoneum overlying the triangle of Calot. This was then divided and exposed in a blunt fashion. The cystic duct was clearly identified and bluntly dissected circumferentially. A critical view of the cystic duct and cystic artery was obtained.  The cystic duct was then ligated with clips and divided. The cystic artery was, dissected free, ligated with clips and divided as well.   The gallbladder was dissected from the liver bed in retrograde fashion with the electrocautery. The gallbladder was removed and placed in an Endocatch sac. The liver bed was irrigated and inspected. Hemostasis was achieved with the electrocautery. Copious irrigation was utilized and was repeatedly aspirated until clear.  The gallbladder and Endocatch sac were then removed through the umbilical port site.  The pursestring suture was used to close the umbilical fascia.    We again inspected the right upper quadrant for hemostasis.  Pneumoperitoneum was released as we removed the trocars.  4-0 Monocryl was used to close the skin.  Skin glue was then applied. The patient was then extubated and brought to the recovery room in  stable condition. Instrument, sponge, and needle  counts were correct at closure and at the conclusion of the case.   Findings: Cholecystitis with Cholelithiasis  Estimated Blood Loss: Minimal         Drains: 0         Specimens: Gallbladder           Complications: None; patient tolerated the procedure well.         Disposition: PACU - hemodynamically stable.         Condition: stable

## 2020-05-09 NOTE — ED Provider Notes (Signed)
[facility] EMERGENCY DEPARTMENT Provider Note   CSN: [id number] Arrival date & time: 05/09/20  0355     History Chief Complaint  Patient presents with  . Abdominal Pain    Jordan Chung is a 30 y.o. female.  The history is provided by the patient.  Abdominal Pain  Jordan Chung is a 30 y.o. female who presents to the Emergency Department complaining of abdominal pain.  She presents to the ED complaining of RUQ abdominal pain that woke her at 0230.  She has associated emesis x 1.  Pain is described as sharp in nature.  No fever, diarrhea, sob.  She is one month post partum following vaginal delivery.  She is currently breast feeding.  No prior abdominal surgeries.  She was diagnosed with gallstones during her pregnancy.     Past Medical History:  Diagnosis Date  . Hyperemesis gravidarum   . Postpartum depression     There are no problems to display for this patient.   History reviewed. No pertinent surgical history.   OB History    Gravida  2   Para  1   Term  1   Preterm  0   AB  0   Living  1     SAB  0   IAB  0   Ectopic  0   Multiple  0   Live Births  1           No family history on file.  Social History   Tobacco Use  . Smoking status: Never Smoker  . Smokeless tobacco: Never Used  Substance Use Topics  . Alcohol use: Never  . Drug use: Never    Home Medications Prior to Admission medications   Medication Sig Start Date End Date Taking? Authorizing Provider  buPROPion (WELLBUTRIN XL) 300 MG 24 hr tablet Take 300 mg by mouth daily.    [provider]  docusate sodium (COLACE) 100 MG capsule Take 1 capsule (100 mg total) by mouth 2 (two) times daily. 12/05/19 12/04/20  Rasch, Victorino Dike I, NP  Doxylamine-Pyridoxine (DICLEGIS PO) Take 10 mg by mouth.    [provider]  metoCLOPramide (REGLAN) 10 MG tablet Take 1 tablet (10 mg total) by mouth 4 (four) times daily -  before meals and at bedtime. 12/05/19 12/04/20   Rasch, Victorino Dike I, NP  omeprazole (PRILOSEC) 20 MG capsule Take 20 mg by mouth daily.    [provider]  polyethylene glycol (MIRALAX / GLYCOLAX) 17 g packet Take 17 g by mouth daily.    [provider]  Prenatal Vit-Fe Fumarate-FA (MULTIVITAMIN-PRENATAL) 27-0.8 MG TABS tablet Take 1 tablet by mouth daily.    [provider]  promethazine (PHENERGAN) 25 MG tablet Take 25 mg by mouth 2 (two) times daily as needed for nausea or vomiting.    [provider]  dicyclomine (BENTYL) 20 MG tablet Take 1 tablet (20 mg total) by mouth 2 (two) times daily. 03/27/19 10/06/19  Lorelee New, PA-C    Allergies    Patient has no known allergies.  Review of Systems   Review of Systems  Gastrointestinal: Positive for abdominal pain.  All other systems reviewed and are negative.   Physical Exam Updated Vital Signs BP 122/60 (BP Location: Right Arm)   Pulse 60   Resp 18   Ht 5\' 8"  (1.727 m)   Wt 103.9 kg   LMP 07/23/2019 (Approximate) Comment: Gravida 2, Para 1  SpO2 97%   Breastfeeding  Yes   BMI 34.82 kg/m   Physical Exam Vitals and nursing note reviewed.  Constitutional:      Appearance: She is well-developed.  HENT:     Head: Normocephalic and atraumatic.  Cardiovascular:     Rate and Rhythm: Normal rate and regular rhythm.     Heart sounds: No murmur heard.   Pulmonary:     Effort: Pulmonary effort is normal. No respiratory distress.     Breath sounds: Normal breath sounds.  Abdominal:     Palpations: Abdomen is soft.     Tenderness: There is no guarding or rebound.     Comments: Moderate RUQ tenderness with positive Murphy sign.    Musculoskeletal:        General: No tenderness.  Skin:    General: Skin is warm and dry.  Neurological:     Mental Status: She is alert and oriented to person, place, and time.  Psychiatric:        Behavior: Behavior normal.     ED Results / Procedures / Treatments   Labs (all labs ordered are listed, but  only abnormal results are displayed) Labs Reviewed  COMPREHENSIVE METABOLIC PANEL - Abnormal; Notable for the following components:      Result Value   Glucose, Bld 107 (*)    Albumin 3.4 (*)    All other components within normal limits  CBC WITH DIFFERENTIAL/PLATELET - Abnormal; Notable for the following components:   Hemoglobin 10.9 (*)    HCT 35.6 (*)    MCV 79.5 (*)    MCH 24.3 (*)    RDW 17.1 (*)    Lymphs Abs 4.5 (*)    All other components within normal limits  RESP PANEL BY RT-PCR (FLU A&B, COVID) ARPGX2  LIPASE, BLOOD  URINALYSIS, ROUTINE W REFLEX MICROSCOPIC  PREGNANCY, URINE    EKG None  Radiology No results found.  Procedures Procedures   Medications Ordered in ED Medications  morphine 4 MG/ML injection 4 mg (has no administration in time range)  morphine 4 MG/ML injection 4 mg (4 mg Intravenous Given 05/09/20 0425)  ondansetron (ZOFRAN) injection 4 mg (4 mg Intravenous Given 05/09/20 0425)  sodium chloride 0.9 % bolus 1,000 mL (1,000 mLs Intravenous New Bag/Given 05/09/20 0435)    ED Course  I have reviewed the triage vital signs and the nursing notes.  Pertinent labs & imaging results that were available during my care of the patient were reviewed by me and considered in my medical decision making (see chart for details).    MDM Rules/Calculators/A&P                         patient here for evaluation of right upper quadrant abdominal pain with emesis that started this morning. She has focal tenderness in the right upper quadrant with positive Murphy's. Labs are reassuring. Unable to obtain ultrasound at this facility. Given patient's persistent pain will transfer for formal imaging to rule out cholecystitis. Discussed with Dr. Nicanor Alcon in the [facility] emergency department, who accepts the patient in transfer. Patient is in agreement with transfer for further evaluation.  Final Clinical Impression(s) / ED Diagnoses Final diagnoses:  RUQ abdominal pain     Rx / DC Orders ED Discharge Orders    None       Tilden Fossa, MD 05/09/20 [phone]

## 2020-05-09 NOTE — ED Notes (Signed)
Per Dr. Madilyn Hook patient to transfer to [facility] ED for ultrasound POV. Leave IV in place.

## 2020-05-09 NOTE — Anesthesia Preprocedure Evaluation (Signed)
Anesthesia Evaluation  Patient identified by MRN, date of birth, ID band Patient awake    Reviewed: Allergy & Precautions, NPO status , Patient's Chart, lab work & pertinent test results  Airway Mallampati: II  TM Distance: >3 FB Neck ROM: Full    Dental no notable dental hx.    Pulmonary neg pulmonary ROS,    Pulmonary exam normal breath sounds clear to auscultation       Cardiovascular negative cardio ROS Normal cardiovascular exam Rhythm:Regular Rate:Normal     Neuro/Psych negative neurological ROS  negative psych ROS   GI/Hepatic negative GI ROS, Neg liver ROS,   Endo/Other  obesity  Renal/GU negative Renal ROS  negative genitourinary   Musculoskeletal negative musculoskeletal ROS (+)   Abdominal   Peds negative pediatric ROS (+)  Hematology negative hematology ROS (+)   Anesthesia Other Findings   Reproductive/Obstetrics negative OB ROS                             Anesthesia Physical Anesthesia Plan  ASA: II  Anesthesia Plan: General   Post-op Pain Management:    Induction: Intravenous  PONV Risk Score and Plan: 3 and Ondansetron, Dexamethasone, Midazolam and Treatment may vary due to age or medical condition  Airway Management Planned: Oral ETT  Additional Equipment:   Intra-op Plan:   Post-operative Plan: Extubation in OR  Informed Consent: I have reviewed the patients History and Physical, chart, labs and discussed the procedure including the risks, benefits and alternatives for the proposed anesthesia with the patient or authorized representative who has indicated his/her understanding and acceptance.   Dental advisory given  Plan Discussed with: CRNA and Surgeon  Anesthesia Plan Comments:         Anesthesia Quick Evaluation  

## 2020-05-10 ENCOUNTER — Encounter (HOSPITAL_COMMUNITY): Payer: Self-pay | Admitting: Surgery

## 2020-05-10 LAB — SURGICAL PATHOLOGY

## 2020-05-11 ENCOUNTER — Emergency Department (HOSPITAL_BASED_OUTPATIENT_CLINIC_OR_DEPARTMENT_OTHER)
Admission: EM | Admit: 2020-05-11 | Discharge: 2020-05-11 | Disposition: A | Discharge status: Home / Self Care | Payer: Medicaid Other | Attending: Emergency Medicine | Admitting: Emergency Medicine

## 2020-05-11 ENCOUNTER — Encounter (HOSPITAL_BASED_OUTPATIENT_CLINIC_OR_DEPARTMENT_OTHER): Payer: Self-pay

## 2020-05-11 ENCOUNTER — Emergency Department (HOSPITAL_BASED_OUTPATIENT_CLINIC_OR_DEPARTMENT_OTHER): Payer: Medicaid Other

## 2020-05-11 ENCOUNTER — Other Ambulatory Visit: Payer: Self-pay

## 2020-05-11 DIAGNOSIS — R1011 Right upper quadrant pain: Secondary | ICD-10-CM | POA: Insufficient documentation

## 2020-05-11 DIAGNOSIS — R1031 Right lower quadrant pain: Secondary | ICD-10-CM | POA: Insufficient documentation

## 2020-05-11 DIAGNOSIS — G8918 Other acute postprocedural pain: Secondary | ICD-10-CM | POA: Diagnosis not present

## 2020-05-11 LAB — URINALYSIS, ROUTINE W REFLEX MICROSCOPIC
Bilirubin Urine: NEGATIVE
Glucose, UA: NEGATIVE mg/dL
Hgb urine dipstick: NEGATIVE
Ketones, ur: NEGATIVE mg/dL
Leukocytes,Ua: NEGATIVE
Nitrite: NEGATIVE
Protein, ur: NEGATIVE mg/dL
Specific Gravity, Urine: 1.01 (ref 1.005–1.030)
pH: 7.5 (ref 5.0–8.0)

## 2020-05-11 LAB — COMPREHENSIVE METABOLIC PANEL
ALT: 26 U/L (ref 0–44)
AST: 26 U/L (ref 15–41)
Albumin: 3.3 g/dL — ABNORMAL LOW (ref 3.5–5.0)
Alkaline Phosphatase: 83 U/L (ref 38–126)
Anion gap: 9 (ref 5–15)
BUN: 9 mg/dL (ref 6–20)
CO2: 25 mmol/L (ref 22–32)
Calcium: 8.7 mg/dL — ABNORMAL LOW (ref 8.9–10.3)
Chloride: 102 mmol/L (ref 98–111)
Creatinine, Ser: 0.79 mg/dL (ref 0.44–1.00)
GFR, Estimated: >60 mL/min (ref >60)
Glucose, Bld: 109 mg/dL — ABNORMAL HIGH (ref 70–99)
Potassium: 3.7 mmol/L (ref 3.5–5.1)
Sodium: 136 mmol/L (ref 135–145)
Total Bilirubin: 0.6 mg/dL (ref 0.3–1.2)
Total Protein: 6.6 g/dL (ref 6.5–8.1)

## 2020-05-11 LAB — CBC WITH DIFFERENTIAL/PLATELET
Abs Immature Granulocytes: 0.04 10*3/uL (ref 0.00–0.07)
Basophils Absolute: 0 10*3/uL (ref 0.0–0.1)
Basophils Relative: 0 %
Eosinophils Absolute: 0.1 10*3/uL (ref 0.0–0.5)
Eosinophils Relative: 1 %
HCT: 35.5 % — ABNORMAL LOW (ref 36.0–46.0)
Hemoglobin: 11.1 g/dL — ABNORMAL LOW (ref 12.0–15.0)
Immature Granulocytes: 0 %
Lymphocytes Relative: 31 %
Lymphs Abs: 3.7 10*3/uL (ref 0.7–4.0)
MCH: 24.6 pg — ABNORMAL LOW (ref 26.0–34.0)
MCHC: 31.3 g/dL (ref 30.0–36.0)
MCV: 78.7 fL — ABNORMAL LOW (ref 80.0–100.0)
Monocytes Absolute: 0.9 10*3/uL (ref 0.1–1.0)
Monocytes Relative: 8 %
Neutro Abs: 7.4 10*3/uL (ref 1.7–7.7)
Neutrophils Relative %: 60 %
Platelets: 309 10*3/uL (ref 150–400)
RBC: 4.51 MIL/uL (ref 3.87–5.11)
RDW: 17.4 % — ABNORMAL HIGH (ref 11.5–15.5)
WBC: 12.2 10*3/uL — ABNORMAL HIGH (ref 4.0–10.5)
nRBC: 0 % (ref 0.0–0.2)

## 2020-05-11 LAB — PREGNANCY, URINE: Preg Test, Ur: NEGATIVE

## 2020-05-11 LAB — LIPASE, BLOOD: Lipase: 23 U/L (ref 11–51)

## 2020-05-11 MED ORDER — IOHEXOL 300 MG/ML  SOLN
100.0000 mL | Freq: Once | INTRAMUSCULAR | Status: AC | PRN
  Administered 2020-05-11: 100 mL via INTRAVENOUS

## 2020-05-11 MED ORDER — FENTANYL CITRATE (PF) 100 MCG/2ML IJ SOLN
50.0000 ug | Freq: Once | INTRAMUSCULAR | Status: AC
  Administered 2020-05-11: 50 ug via INTRAVENOUS
  Filled 2020-05-11: qty 2

## 2020-05-11 MED ORDER — SODIUM CHLORIDE 0.9 % IV BOLUS
1000.0000 mL | Freq: Once | INTRAVENOUS | Status: AC
  Administered 2020-05-11: 1000 mL via INTRAVENOUS

## 2020-05-11 MED ORDER — ONDANSETRON HCL 4 MG PO TABS: 4.0000 mg | ORAL_TABLET | Freq: Four times a day (QID) | ORAL | 0 refills | Status: DC

## 2020-05-11 NOTE — ED Provider Notes (Signed)
[facility] EMERGENCY DEPARTMENT Provider Note   CSN: [id number] Arrival date & time: 05/11/20  1649     History Chief Complaint  Patient presents with  . Abdominal Pain    Jordan Chung is a 30 y.o. female.  The history is provided by the patient.  Abdominal Pain Pain location:  RUQ, epigastric and RLQ Pain quality: aching   Pain radiates to:  Does not radiate Pain severity:  Mild Onset quality:  Gradual Duration:  2 days Timing:  Intermittent Progression:  Waxing and waning Chronicity:  New Context: previous surgery (Gallbladder removed two days ago, pain ever since. Initially on tramadol and then called in oxycodone yesterday. Had syncopal type episode in the shower today and did not hit her head. Has felt weak and abdominal pain. Constipated as well.)   Relieved by:  Nothing Worsened by:  Nothing Associated symptoms: no anorexia, no belching, no chest pain, no chills, no constipation, no cough, no diarrhea, no dysuria, no fatigue, no fever, no flatus, no hematemesis, no hematochezia, no hematuria, no melena, no nausea, no shortness of breath, no sore throat, no vaginal bleeding, no vaginal discharge and no vomiting   Risk factors: multiple surgeries   Risk factors: not pregnant        Past Medical History:  Diagnosis Date  . Hyperemesis gravidarum   . Postpartum depression     There are no problems to display for this patient.   Past Surgical History:  Procedure Laterality Date  . CHOLECYSTECTOMY N/A 05/09/2020   Procedure: LAPAROSCOPIC CHOLECYSTECTOMY WITH INTRAOPERATIVE CHOLANGIOGRAM;  Surgeon: Abigail Miyamoto, MD;  Location: [facility];  Service: General;  Laterality: N/A;     OB History    Gravida  2   Para  1   Term  1   Preterm  0   AB  0   Living  1     SAB  0   IAB  0   Ectopic  0   Multiple  0   Live Births  1           No family history on file.  Social History   Tobacco Use  . Smoking status: Never Smoker  .  Smokeless tobacco: Never Used  Substance Use Topics  . Alcohol use: Never  . Drug use: Never    Home Medications Prior to Admission medications   Medication Sig Start Date End Date Taking? Authorizing Provider  ondansetron (ZOFRAN) 4 MG tablet Take 1 tablet (4 mg total) by mouth every 6 (six) hours. 05/11/20  Yes Jayde Daffin, DO  buPROPion (WELLBUTRIN XL) 300 MG 24 hr tablet Take 300 mg by mouth daily.    [provider]  docusate sodium (COLACE) 100 MG capsule Take 1 capsule (100 mg total) by mouth 2 (two) times daily. 12/05/19 12/04/20  Rasch, Victorino Dike I, NP  Doxylamine-Pyridoxine (DICLEGIS PO) Take 10 mg by mouth.    [provider]  metoCLOPramide (REGLAN) 10 MG tablet Take 1 tablet (10 mg total) by mouth 4 (four) times daily -  before meals and at bedtime. 12/05/19 12/04/20  Rasch, Victorino Dike I, NP  omeprazole (PRILOSEC) 20 MG capsule Take 20 mg by mouth daily.    [provider]  polyethylene glycol (MIRALAX / GLYCOLAX) 17 g packet Take 17 g by mouth daily.    [provider]  Prenatal Vit-Fe Fumarate-FA (MULTIVITAMIN-PRENATAL) 27-0.8 MG TABS tablet Take 1 tablet by mouth daily.    [provider]  promethazine (PHENERGAN) 25  MG tablet Take 25 mg by mouth 2 (two) times daily as needed for nausea or vomiting.    [provider]  traMADol (ULTRAM) 50 MG tablet Take 1 tablet (50 mg total) by mouth every 6 (six) hours as needed for moderate pain or severe pain. 05/09/20   Abigail Miyamoto, MD  dicyclomine (BENTYL) 20 MG tablet Take 1 tablet (20 mg total) by mouth 2 (two) times daily. 03/27/19 10/06/19  Lorelee New, PA-C    Allergies    Patient has no known allergies.  Review of Systems   Review of Systems  Constitutional: Negative for chills, fatigue and fever.  HENT: Negative for ear pain and sore throat.   Eyes: Negative for pain and visual disturbance.  Respiratory: Negative for cough and shortness of breath.   Cardiovascular:  Negative for chest pain and palpitations.  Gastrointestinal: Positive for abdominal pain. Negative for anorexia, constipation, diarrhea, flatus, hematemesis, hematochezia, melena, nausea and vomiting.  Genitourinary: Negative for dysuria, hematuria, vaginal bleeding and vaginal discharge.  Musculoskeletal: Negative for arthralgias and back pain.  Skin: Negative for color change and rash.  Neurological: Negative for seizures and syncope.  All other systems reviewed and are negative.   Physical Exam Updated Vital Signs  ED Triage Vitals  Enc Vitals Group     BP 05/11/20 1658 106/64     Pulse Rate 05/11/20 1658 (!) 101     Resp 05/11/20 1658 20     Temp 05/11/20 1658 98 F (36.7 C)     Temp Source 05/11/20 1658 Oral     SpO2 05/11/20 1658 100 %     Weight --      Height --      Head Circumference --      Peak Flow --      Pain Score 05/11/20 1657 8     Pain Loc --      Pain Edu? --      Excl. in GC? --     Physical Exam Vitals and nursing note reviewed.  Constitutional:      General: She is not in acute distress.    Appearance: She is well-developed. She is not ill-appearing.  HENT:     Head: Normocephalic and atraumatic.     Mouth/Throat:     Mouth: Mucous membranes are moist.  Eyes:     Extraocular Movements: Extraocular movements intact.     Conjunctiva/sclera: Conjunctivae normal.     Pupils: Pupils are equal, round, and reactive to light.  Cardiovascular:     Rate and Rhythm: Normal rate and regular rhythm.     Heart sounds: Normal heart sounds. No murmur heard.   Pulmonary:     Effort: Pulmonary effort is normal. No respiratory distress.     Breath sounds: Normal breath sounds.  Abdominal:     Palpations: Abdomen is soft.     Tenderness: There is abdominal tenderness in the right upper quadrant and right lower quadrant.     Hernia: No hernia is present.  Musculoskeletal:     Cervical back: Neck supple.  Skin:    General: Skin is warm and dry.      Capillary Refill: Capillary refill takes less than 2 seconds.     Comments: Surgical site is clear dry and intact  Neurological:     General: No focal deficit present.     Mental Status: She is alert and oriented to person, place, and time.     Cranial Nerves: No cranial nerve  deficit.     Motor: No weakness.     Comments: 5+ out of 5 strength throughout, normal sensation, no drift, normal finger-to-nose finger  Psychiatric:        Mood and Affect: Mood normal.     ED Results / Procedures / Treatments   Labs (all labs ordered are listed, but only abnormal results are displayed) Labs Reviewed  CBC WITH DIFFERENTIAL/PLATELET - Abnormal; Notable for the following components:      Result Value   WBC 12.2 (*)    Hemoglobin 11.1 (*)    HCT 35.5 (*)    MCV 78.7 (*)    MCH 24.6 (*)    RDW 17.4 (*)    All other components within normal limits  COMPREHENSIVE METABOLIC PANEL - Abnormal; Notable for the following components:   Glucose, Bld 109 (*)    Calcium 8.7 (*)    Albumin 3.3 (*)    All other components within normal limits  LIPASE, BLOOD  PREGNANCY, URINE  URINALYSIS, ROUTINE W REFLEX MICROSCOPIC    EKG EKG Interpretation  Date/Time:  Wednesday May 11 2020 17:37:10 EDT Ventricular Rate:  71 PR Interval:  132 QRS Duration: 88 QT Interval:  408 QTC Calculation: 444 R Axis:   64 Text Interpretation: Sinus rhythm Confirmed by Virgina Norfolk ([id number]) on 05/11/2020 5:42:03 PM   Radiology CT ABDOMEN PELVIS W CONTRAST  Result Date: 05/11/2020 CLINICAL DATA:  Laparoscopic cholecystectomy 05/09/2020, continued abdominal pain EXAM: CT ABDOMEN AND PELVIS WITH CONTRAST TECHNIQUE: Multidetector CT imaging of the abdomen and pelvis was performed using the standard protocol following bolus administration of intravenous contrast. CONTRAST:  OMNIPAQUE IOHEXOL 300 MG/ML  SOLN COMPARISON:  05/09/2020, 03/27/2019 FINDINGS: Lower chest: No acute pleural or parenchymal lung disease.  Hepatobiliary: Postsurgical changes from cholecystectomy, with minimal fluid and fat stranding in the gallbladder fossa compatible with postsurgical change. No evidence of fluid collection or abscess. The liver is unremarkable without biliary dilation. Pancreas: Unremarkable. No pancreatic ductal dilatation or surrounding inflammatory changes. Spleen: Normal in size without focal abnormality. Adrenals/Urinary Tract: There are bilateral nonobstructing renal calculi. There are two 3 mm nonobstructing calculi on the right, with a single 3 mm calculus on the left. No obstructive uropathy within either kidney. The adrenals and bladder are unremarkable. Stomach/Bowel: No bowel obstruction or ileus. Normal appendix central upper pelvis. No bowel wall thickening or inflammatory change. Vascular/Lymphatic: Aortic atherosclerosis. No enlarged abdominal or pelvic lymph nodes. Reproductive: Uterus and bilateral adnexa are unremarkable. Other: There is a small amount of pneumoperitoneum within the upper abdomen consistent with recent laparoscopic surgery. Trace free fluid in the right upper quadrant and lower pelvis consistent with postsurgical change. Postsurgical change at the umbilicus from laparoscopy port. No fluid collection. No abdominal wall hernia. Musculoskeletal: No acute or destructive bony lesions. Reconstructed images demonstrate no additional findings. IMPRESSION: 1. Expected postsurgical changes from laparoscopic cholecystectomy. No evidence of fluid collection or abscess. 2. Bilateral nonobstructing renal calculi. 3.  Aortic Atherosclerosis (ICD10-I70.0). Electronically Signed   By: Sharlet Salina M.D.   On: 05/11/2020 19:01    Procedures Procedures   Medications Ordered in ED Medications  fentaNYL (SUBLIMAZE) injection 50 mcg (50 mcg Intravenous Given 05/11/20 1748)  sodium chloride 0.9 % bolus 1,000 mL (0 mLs Intravenous Stopped 05/11/20 1856)  iohexol (OMNIPAQUE) 300 MG/ML solution 100 mL (100 mLs  Intravenous Contrast Given 05/11/20 1818)    ED Course  I have reviewed the triage vital signs and the nursing notes.  Pertinent labs &  imaging results that were available during my care of the patient were reviewed by me and considered in my medical decision making (see chart for details).    MDM Rules/Calculators/A&P                          Tonna Boehringermma Nagy is a 30 year old female who presents to the ED with abdominal pain.  Normal vitals.  No fever.  Patient had her gallbladder removed 2 days ago.  States that she has had bad pain even as she was leaving the hospital.  Tramadol and oxycodone has not helped much.  She states that she got weak in the shower today.  Her husband had to help her to the floor because she felt like she is in a pass out.  She did think that she lost consciousness briefly.  She has not been able to eat or drink much.  She did not hit her head as her husband caught her.  She still having abdominal pain mostly at the surgical site but overall surgical site appears clean dry and intact.  She has not had a normal bowel movement yet either.  But not having uncontrollable nausea or vomiting.  Overall will hydrate and get a CT scan to evaluate for possible abscess or post op complication such as of bowel perforation.  Could be constipation is causing her symptoms.  Passing out episode today may be secondary to narcotic use.  She is neurologically intact.  Lab work overall unremarkable.  No significant leukocytosis, anemia, electrolyte abnormality.  CT scan with no signs of abscess or other concerning features.  Abdomen is soft and no concern for peritonitis.  Has a small amount of pneumoperitoneum which is what I would expect from recent surgery.  No fever.  Overall suspect some dehydration causing some of her symptoms today.  Pain management has been increased.  Will prescribe Zofran.  Educated about how to advance her diet.  Encouraged bowel regimen.  Discharged in good condition.   Understands return precautions.  This chart was dictated using voice recognition software.  Despite best efforts to proofread,  errors can occur which can change the documentation meaning.     Final Clinical Impression(s) / ED Diagnoses Final diagnoses:  Post-op pain    Rx / DC Orders ED Discharge Orders         Ordered    ondansetron (ZOFRAN) 4 MG tablet  Every 6 hours        05/11/20 1906           Virgina NorfolkCuratolo, Marizol Borror, DO 05/11/20 1908

## 2020-05-11 NOTE — ED Triage Notes (Signed)
Pt c/o cont'd abd pain pre and post surgery-states she spoke with surgeon office yesterday-new pain meds called in-states pain cont'd and she passed out in the shower today-NAD-to triage in w/c

## 2020-05-11 NOTE — ED Notes (Signed)
Patient transported to CT 

## 2020-05-11 NOTE — Discharge Instructions (Addendum)
Take Zofran as needed for nausea.  Continue pain regimen and bowel regimen per surgery.  Please return if you develop a fever of 100.4 or greater or any other worsening symptoms as discussed.

## 2020-05-11 NOTE — ED Notes (Signed)
Pt up to bedside commode

## 2020-05-13 ENCOUNTER — Emergency Department (HOSPITAL_COMMUNITY): Payer: Medicaid Other

## 2020-05-13 ENCOUNTER — Inpatient Hospital Stay (HOSPITAL_COMMUNITY): Payer: Medicaid Other

## 2020-05-13 ENCOUNTER — Other Ambulatory Visit: Payer: Self-pay

## 2020-05-13 ENCOUNTER — Inpatient Hospital Stay (HOSPITAL_COMMUNITY)
Admission: EM | Admit: 2020-05-13 | Discharge: 2020-05-17 | DRG: 776 | Disposition: A | Discharge status: Home / Self Care | Payer: Medicaid Other | Attending: General Surgery | Admitting: General Surgery

## 2020-05-13 ENCOUNTER — Encounter (HOSPITAL_COMMUNITY): Payer: Self-pay

## 2020-05-13 DIAGNOSIS — G8918 Other acute postprocedural pain: Secondary | ICD-10-CM | POA: Diagnosis present

## 2020-05-13 DIAGNOSIS — K9189 Other postprocedural complications and disorders of digestive system: Secondary | ICD-10-CM | POA: Diagnosis present

## 2020-05-13 DIAGNOSIS — O2663 Liver and biliary tract disorders in the puerperium: Principal | ICD-10-CM | POA: Diagnosis present

## 2020-05-13 DIAGNOSIS — Z20822 Contact with and (suspected) exposure to covid-19: Secondary | ICD-10-CM | POA: Diagnosis present

## 2020-05-13 DIAGNOSIS — K838 Other specified diseases of biliary tract: Secondary | ICD-10-CM

## 2020-05-13 DIAGNOSIS — Y836 Removal of other organ (partial) (total) as the cause of abnormal reaction of the patient, or of later complication, without mention of misadventure at the time of the procedure: Secondary | ICD-10-CM | POA: Diagnosis present

## 2020-05-13 DIAGNOSIS — K915 Postcholecystectomy syndrome: Secondary | ICD-10-CM | POA: Diagnosis present

## 2020-05-13 DIAGNOSIS — K839 Disease of biliary tract, unspecified: Secondary | ICD-10-CM

## 2020-05-13 LAB — COMPREHENSIVE METABOLIC PANEL
ALT: 21 U/L (ref 0–44)
AST: 16 U/L (ref 15–41)
Albumin: 3.5 g/dL (ref 3.5–5.0)
Alkaline Phosphatase: 91 U/L (ref 38–126)
Anion gap: 9 (ref 5–15)
BUN: 9 mg/dL (ref 6–20)
CO2: 24 mmol/L (ref 22–32)
Calcium: 9 mg/dL (ref 8.9–10.3)
Chloride: 103 mmol/L (ref 98–111)
Creatinine, Ser: 0.85 mg/dL (ref 0.44–1.00)
GFR, Estimated: >60 mL/min (ref >60)
Glucose, Bld: 116 mg/dL — ABNORMAL HIGH (ref 70–99)
Potassium: 3.7 mmol/L (ref 3.5–5.1)
Sodium: 136 mmol/L (ref 135–145)
Total Bilirubin: 1 mg/dL (ref 0.3–1.2)
Total Protein: 6.9 g/dL (ref 6.5–8.1)

## 2020-05-13 LAB — CBC WITH DIFFERENTIAL/PLATELET
Abs Immature Granulocytes: 0.04 10*3/uL (ref 0.00–0.07)
Basophils Absolute: 0 10*3/uL (ref 0.0–0.1)
Basophils Relative: 0 %
Eosinophils Absolute: 0.1 10*3/uL (ref 0.0–0.5)
Eosinophils Relative: 1 %
HCT: 35.4 % — ABNORMAL LOW (ref 36.0–46.0)
Hemoglobin: 11 g/dL — ABNORMAL LOW (ref 12.0–15.0)
Immature Granulocytes: 0 %
Lymphocytes Relative: 30 %
Lymphs Abs: 3.5 10*3/uL (ref 0.7–4.0)
MCH: 24.3 pg — ABNORMAL LOW (ref 26.0–34.0)
MCHC: 31.1 g/dL (ref 30.0–36.0)
MCV: 78.3 fL — ABNORMAL LOW (ref 80.0–100.0)
Monocytes Absolute: 0.8 10*3/uL (ref 0.1–1.0)
Monocytes Relative: 7 %
Neutro Abs: 7.2 10*3/uL (ref 1.7–7.7)
Neutrophils Relative %: 62 %
Platelets: 339 10*3/uL (ref 150–400)
RBC: 4.52 MIL/uL (ref 3.87–5.11)
RDW: 17.3 % — ABNORMAL HIGH (ref 11.5–15.5)
WBC: 11.6 10*3/uL — ABNORMAL HIGH (ref 4.0–10.5)
nRBC: 0 % (ref 0.0–0.2)

## 2020-05-13 LAB — LIPASE, BLOOD: Lipase: 27 U/L (ref 11–51)

## 2020-05-13 LAB — RESP PANEL BY RT-PCR (FLU A&B, COVID) ARPGX2
Influenza A by PCR: NEGATIVE
Influenza B by PCR: NEGATIVE
SARS Coronavirus 2 by RT PCR: NEGATIVE

## 2020-05-13 MED ORDER — FENTANYL CITRATE (PF) 100 MCG/2ML IJ SOLN
75.0000 ug | Freq: Once | INTRAMUSCULAR | Status: AC
  Administered 2020-05-13: 75 ug via INTRAVENOUS
  Filled 2020-05-13: qty 2

## 2020-05-13 MED ORDER — ENOXAPARIN SODIUM 40 MG/0.4ML IJ SOSY
40.0000 mg | PREFILLED_SYRINGE | INTRAMUSCULAR | Status: DC
  Administered 2020-05-14: 40 mg via SUBCUTANEOUS
  Filled 2020-05-13: qty 0.4

## 2020-05-13 MED ORDER — PANTOPRAZOLE SODIUM 40 MG IV SOLR
40.0000 mg | Freq: Every day | INTRAVENOUS | Status: DC
  Administered 2020-05-13 – 2020-05-16 (×4): 40 mg via INTRAVENOUS
  Filled 2020-05-13 (×4): qty 40

## 2020-05-13 MED ORDER — BISACODYL 10 MG RE SUPP: 10.0000 mg | Freq: Every day | RECTAL | Status: DC | PRN

## 2020-05-13 MED ORDER — HYDROMORPHONE HCL 1 MG/ML IJ SOLN
0.5000 mg | Freq: Once | INTRAMUSCULAR | Status: AC
  Administered 2020-05-13: 0.5 mg via INTRAVENOUS
  Filled 2020-05-13: qty 1

## 2020-05-13 MED ORDER — ONDANSETRON HCL 4 MG/2ML IJ SOLN
4.0000 mg | Freq: Once | INTRAMUSCULAR | Status: AC
  Administered 2020-05-13: 4 mg via INTRAVENOUS
  Filled 2020-05-13: qty 2

## 2020-05-13 MED ORDER — HYDROMORPHONE HCL 1 MG/ML IJ SOLN
1.0000 mg | Freq: Once | INTRAMUSCULAR | Status: AC
  Administered 2020-05-13: 1 mg via INTRAVENOUS
  Filled 2020-05-13: qty 1

## 2020-05-13 MED ORDER — HYDRALAZINE HCL 20 MG/ML IJ SOLN: 10.0000 mg | INTRAMUSCULAR | Status: DC | PRN

## 2020-05-13 MED ORDER — IOHEXOL 300 MG/ML  SOLN
100.0000 mL | Freq: Once | INTRAMUSCULAR | Status: AC | PRN
  Administered 2020-05-13: 100 mL via INTRAVENOUS

## 2020-05-13 MED ORDER — SODIUM CHLORIDE 0.9 % IV SOLN: INTRAVENOUS | Status: DC

## 2020-05-13 MED ORDER — ONDANSETRON 4 MG PO TBDP: 4.0000 mg | ORAL_TABLET | Freq: Four times a day (QID) | ORAL | Status: DC | PRN

## 2020-05-13 MED ORDER — ACETAMINOPHEN 325 MG PO TABS
650.0000 mg | ORAL_TABLET | Freq: Four times a day (QID) | ORAL | Status: DC | PRN
  Administered 2020-05-14 – 2020-05-16 (×3): 650 mg via ORAL
  Filled 2020-05-13 (×3): qty 2

## 2020-05-13 MED ORDER — SIMETHICONE 80 MG PO CHEW
40.0000 mg | CHEWABLE_TABLET | Freq: Four times a day (QID) | ORAL | Status: DC | PRN
  Administered 2020-05-14: 40 mg via ORAL
  Filled 2020-05-13 (×3): qty 1

## 2020-05-13 MED ORDER — DOCUSATE SODIUM 100 MG PO CAPS
100.0000 mg | ORAL_CAPSULE | Freq: Two times a day (BID) | ORAL | Status: DC
  Administered 2020-05-13 – 2020-05-17 (×8): 100 mg via ORAL
  Filled 2020-05-13 (×8): qty 1

## 2020-05-13 MED ORDER — OXYCODONE HCL 5 MG PO TABS
5.0000 mg | ORAL_TABLET | ORAL | Status: DC | PRN
  Administered 2020-05-13 (×2): 10 mg via ORAL
  Filled 2020-05-13 (×3): qty 2

## 2020-05-13 MED ORDER — DIPHENHYDRAMINE HCL 25 MG PO CAPS: 25.0000 mg | ORAL_CAPSULE | Freq: Four times a day (QID) | ORAL | Status: DC | PRN

## 2020-05-13 MED ORDER — ONDANSETRON HCL 4 MG/2ML IJ SOLN
4.0000 mg | Freq: Four times a day (QID) | INTRAMUSCULAR | Status: DC | PRN
Start: 2020-05-13 — End: 2020-05-17
  Administered 2020-05-15 – 2020-05-16 (×2): 4 mg via INTRAVENOUS
  Filled 2020-05-13 (×2): qty 2

## 2020-05-13 MED ORDER — POLYETHYLENE GLYCOL 3350 17 G PO PACK
17.0000 g | PACK | Freq: Every day | ORAL | Status: DC | PRN
  Filled 2020-05-13: qty 1

## 2020-05-13 MED ORDER — MORPHINE SULFATE (PF) 2 MG/ML IV SOLN
2.0000 mg | INTRAVENOUS | Status: DC | PRN
  Administered 2020-05-13 – 2020-05-14 (×6): 2 mg via INTRAVENOUS
  Filled 2020-05-13 (×6): qty 1

## 2020-05-13 MED ORDER — DIPHENHYDRAMINE HCL 50 MG/ML IJ SOLN: 25.0000 mg | Freq: Four times a day (QID) | INTRAMUSCULAR | Status: DC | PRN

## 2020-05-13 MED ORDER — ACETAMINOPHEN 650 MG RE SUPP: 650.0000 mg | Freq: Four times a day (QID) | RECTAL | Status: DC | PRN

## 2020-05-13 MED ORDER — METHOCARBAMOL 500 MG PO TABS
500.0000 mg | ORAL_TABLET | Freq: Four times a day (QID) | ORAL | Status: DC | PRN
  Administered 2020-05-14 – 2020-05-17 (×6): 500 mg via ORAL
  Filled 2020-05-13 (×6): qty 1

## 2020-05-13 NOTE — Consult Note (Signed)
[facility] Consult  Referring Provider: [facility]/Dr. Magnus Ivan Primary Care Physician:  Doris Cheadle Primary Gastroenterologist: Gentry Fitz  Reason for Consultation: Bile leak  HPI: Jordan Chung is a 30 y.o. female underwent laparoscopic cholecystectomy on 05/09/2020, returned home with continued right upper quadrant abdominal pain and came to the ER thereafter.  Patient is 4 weeks postpartum. She had a CAT scan of the abdomen and pelvis which showed increase in well-defined fluid collection gallbladder fossa measuring 5.9 x 2.5 cm. HIDA scan showed small focus of mild progressive accumulation within gallbladder fossa could be small contained leak versus accumulation within gallbladder remnant. Patent common bile duct. Patient complains of continued right upper quadrant pain radiating to her shoulders and her back. She denies nausea, vomiting or fever.  Past Medical History:  Diagnosis Date  . Hyperemesis gravidarum   . Postpartum depression     Past Surgical History:  Procedure Laterality Date  . CHOLECYSTECTOMY N/A 05/09/2020   Procedure: LAPAROSCOPIC CHOLECYSTECTOMY WITH INTRAOPERATIVE CHOLANGIOGRAM;  Surgeon: Abigail Miyamoto, MD;  Location: [facility];  Service: General;  Laterality: N/A;    Prior to Admission medications   Medication Sig Start Date End Date Taking? Authorizing Provider  docusate sodium (COLACE) 100 MG capsule Take 1 capsule (100 mg total) by mouth 2 (two) times daily. Patient taking differently: Take 100 mg by mouth daily as needed for mild constipation. 12/05/19 12/04/20 Yes Rasch, Victorino Dike I, NP  oxyCODONE (OXY IR/ROXICODONE) 5 MG immediate release tablet Take 5 mg by mouth every 6 (six) hours as needed for pain. 05/10/20  Yes [provider]  polyethylene glycol (MIRALAX / GLYCOLAX) 17 g packet Take 17 g by mouth daily as needed for mild constipation.   Yes [provider]  Prenatal Vit-Fe Fumarate-FA (MULTIVITAMIN-PRENATAL)  27-0.8 MG TABS tablet Take 1 tablet by mouth daily.   Yes [provider]  traMADol (ULTRAM) 50 MG tablet Take 1 tablet (50 mg total) by mouth every 6 (six) hours as needed for moderate pain or severe pain. 05/09/20  Yes Abigail Miyamoto, MD  metoCLOPramide (REGLAN) 10 MG tablet Take 1 tablet (10 mg total) by mouth 4 (four) times daily -  before meals and at bedtime. Patient not taking: No sig reported 12/05/19 12/04/20  Rasch, Victorino Dike I, NP  ondansetron (ZOFRAN) 4 MG tablet Take 1 tablet (4 mg total) by mouth every 6 (six) hours. 05/11/20   Curatolo, Adam, DO  dicyclomine (BENTYL) 20 MG tablet Take 1 tablet (20 mg total) by mouth 2 (two) times daily. 03/27/19 10/06/19  Lorelee New, PA-C    Current Facility-Administered Medications  Medication Dose Route Frequency Provider Last Rate Last Admin  . 0.9 %  sodium chloride infusion   Intravenous Continuous Jacinto Halim, PA-C 125 mL/hr at 05/13/20 1308 New Bag at 05/13/20 1308  . acetaminophen (TYLENOL) tablet 650 mg  650 mg Oral Q6H PRN Maczis, Elmer Sow, PA-C       Or  . acetaminophen (TYLENOL) suppository 650 mg  650 mg Rectal Q6H PRN Maczis, Elmer Sow, PA-C      . bisacodyl (DULCOLAX) suppository 10 mg  10 mg Rectal Daily PRN Maczis, Elmer Sow, PA-C      . diphenhydrAMINE (BENADRYL) capsule 25 mg  25 mg Oral Q6H PRN Maczis, Elmer Sow, PA-C       Or  . diphenhydrAMINE (BENADRYL) injection 25 mg  25 mg Intravenous Q6H PRN Maczis, Elmer Sow, PA-C      . docusate sodium (COLACE) capsule 100 mg  100 mg Oral BID Jacinto Halim, PA-C      . enoxaparin (LOVENOX) injection 40 mg  40 mg Subcutaneous Q24H Maczis, Elmer Sow, PA-C      . hydrALAZINE (APRESOLINE) injection 10 mg  10 mg Intravenous Q2H PRN Maczis, Elmer Sow, PA-C      . methocarbamol (ROBAXIN) tablet 500 mg  500 mg Oral Q6H PRN Maczis, Elmer Sow, PA-C      . morphine 2 MG/ML injection 2 mg  2 mg Intravenous Q2H PRN Jacinto Halim, PA-C   2 mg at 05/13/20 1407  .  ondansetron (ZOFRAN-ODT) disintegrating tablet 4 mg  4 mg Oral Q6H PRN Maczis, Elmer Sow, PA-C       Or  . ondansetron [facility]) injection 4 mg  4 mg Intravenous Q6H PRN Maczis, Elmer Sow, PA-C      . oxyCODONE (Oxy IR/ROXICODONE) immediate release tablet 5-10 mg  5-10 mg Oral Q4H PRN Jacinto Halim, PA-C   10 mg at 05/13/20 1308  . pantoprazole (PROTONIX) injection 40 mg  40 mg Intravenous QHS Maczis, Michael M, PA-C      . polyethylene glycol (MIRALAX / GLYCOLAX) packet 17 g  17 g Oral Daily PRN Maczis, Elmer Sow, PA-C      . simethicone (MYLICON) chewable tablet 40 mg  40 mg Oral Q6H PRN Maczis, Elmer Sow, PA-C        Allergies as of 05/13/2020  . (No Known Allergies)    History reviewed. No pertinent family history.  Social History   Socioeconomic History  . Marital status: Divorced    Spouse name: Not on file  . Number of children: Not on file  . Years of education: Not on file  . Highest education level: Not on file  Occupational History  . Not on file  Tobacco Use  . Smoking status: Never Smoker  . Smokeless tobacco: Never Used  Substance and Sexual Activity  . Alcohol use: Never  . Drug use: Never  . Sexual activity: Not on file  Other Topics Concern  . Not on file  Social History Narrative  . Not on file   Social Determinants of Health   Financial Resource Strain: Not on file  Food Insecurity: Not on file  Transportation Needs: Not on file  Physical Activity: Not on file  Stress: Not on file  Social Connections: Not on file  Intimate Partner Violence: Not on file    Review of Systems: Positive for: GI: Described in detail in HPI.    Gen: anorexia, denies any fever, chills, rigors, night sweats,  fatigue, weakness, malaise, involuntary weight loss, and sleep disorder CV: Denies chest pain, angina, palpitations, syncope, orthopnea, PND, peripheral edema, and claudication. Resp: Denies dyspnea, cough, sputum, wheezing, coughing up blood. GU : Denies  urinary burning, blood in urine, urinary frequency, urinary hesitancy, nocturnal urination, and urinary incontinence. MS: Denies joint pain or swelling.  Denies muscle weakness, cramps, atrophy.  Derm: Denies rash, itching, oral ulcerations, hives, unhealing ulcers.  Psych: Denies depression, anxiety, memory loss, suicidal ideation, hallucinations,  and confusion. Heme: Denies bruising, bleeding, and enlarged lymph nodes. Neuro:  Denies any headaches, dizziness, paresthesias. Endo:  Denies any problems with DM, thyroid, adrenal function.  Physical Exam: Vital signs in last 24 hours: Temp:  [97.5 F (36.4 C)-98 F (36.7 C)] 98 F (36.7 C) (05/06 1221) Pulse Rate:  [53-73] 53 (05/06 1221) Resp:  [12-37] 16 (05/06 1221) BP: (88-117)/(45-92) 112/74 (05/06 1221) SpO2:  [93 %-100 %]  99 % (05/06 0900) Weight:  [103.9 kg] 103.9 kg (05/06 0510)    General:   Alert,  Well-developed, well-nourished, pleasant and cooperative in NAD Head:  Normocephalic and atraumatic. Eyes:  Sclera clear, no icterus.   Conjunctiva pink. Ears:  Normal auditory acuity. Nose:  No deformity, discharge,  or lesions. Mouth:  No deformity or lesions.  Oropharynx pink & moist. Neck:  Supple; no masses or thyromegaly. Lungs:  Clear throughout to auscultation.   No wheezes, crackles, or rhonchi. No acute distress. Heart:  Regular rate and rhythm; no murmurs, clicks, rubs,  or gallops. Extremities:  Without clubbing or edema. Neurologic:  Alert and  oriented x4;  grossly normal neurologically. Skin:  Intact without significant lesions or rashes. Psych:  Alert and cooperative. Normal mood and affect. Abdomen:  Soft, mild right upper quadrant tenderness and nondistended. No masses, hepatosplenomegaly or hernias noted. Normal bowel sounds, without guarding, and without rebound.    Healing surgical incisions noted     Lab Results: Recent Labs    05/11/20 1734 05/13/20 0549  WBC 12.2* 11.6*  HGB 11.1* 11.0*  HCT  35.5* 35.4*  PLT 309 339   BMET Recent Labs    05/11/20 1734 05/13/20 0549  NA 136 136  K 3.7 3.7  CL 102 103  CO2 25 24  GLUCOSE 109* 116*  BUN 9 9  CREATININE 0.79 0.85  CALCIUM 8.7* 9.0   LFT Recent Labs    05/13/20 0549  PROT 6.9  ALBUMIN 3.5  AST 16  ALT 21  ALKPHOS 91  BILITOT 1.0   PT/INR No results for input(s): LABPROT, INR in the last 72 hours.  Studies/Results: CT ABDOMEN PELVIS W CONTRAST  Result Date: 05/13/2020 CLINICAL DATA:  Recent cholecystectomy with pain EXAM: CT ABDOMEN AND PELVIS WITH CONTRAST TECHNIQUE: Multidetector CT imaging of the abdomen and pelvis was performed using the standard protocol following bolus administration of intravenous contrast. CONTRAST:  100mL OMNIPAQUE IOHEXOL 300 MG/ML  SOLN COMPARISON:  May 11, 2020 FINDINGS: Lower chest: There is bibasilar atelectasis. Lung bases otherwise are clear. Hepatobiliary: No focal liver lesions are appreciable. Gallbladder is absent. There is slightly more fluid in the gallbladder fossa region compared to 2 days prior with fluid collection in the gallbladder fossa region measuring 5.9 x 2.5 cm. This fluid collection is better defined than on study from 2 days prior. There is fat stranding in the medial gallbladder fossa region consistent with recent surgery, similar to 2 days prior. Trace air along the periphery of this fluid collection may be of postoperative etiology. A well-defined air collection within the fluid in the gallbladder fossa is not appreciable. Soft tissue stranding is noted along the lateral right abdomen consistent with recent surgery, essentially stable. There is no appreciable biliary duct dilatation. Pancreas: No pancreatic mass or inflammatory focus. Spleen: No splenic lesions are evident. Adrenals/Urinary Tract: Adrenals bilaterally appear normal. There is no evident mass or hydronephrosis on either side. There is a 2 mm calculus with an adjacent 3 mm calculus in the mid right kidney,  unchanged. There is a 2 mm calculus in the mid left kidney, unchanged. No ureteral calculus evident on either side. Urinary bladder is midline with wall thickness within normal limits. Stomach/Bowel: There is no bowel wall or mesenteric thickening. No evident bowel obstruction. Terminal ileum appears normal. Appendix appears normal. There is no evident free air or portal venous air. Small amount of pneumoperitoneum seen 2 days prior no longer evident. Vascular/Lymphatic: No abdominal aortic  aneurysm. No arterial vascular lesions are evident. Major venous structures appear patent. There is a circumaortic left renal vein, an anatomic variant. No evident adenopathy in the abdomen or pelvis. Reproductive: Uterus is anteverted. No adnexal masses evident. There is a small amount of fluid in the cul-de-sac, stable. Other: No well-defined abscess in the abdomen or pelvis. Minimal fluid tracking along the right lateral conal fascia is likely secondary to recent surgery. No ascites beyond the mild fluid in the cul-de-sac. Musculoskeletal: No blastic or lytic bone lesions. No intramuscular lesions. Evidence of recent surgery at the umbilicus level. IMPRESSION: 1. There has been an increase in a well-defined fluid collection in the gallbladder fossa compared to 2 days prior with fluid in this area currently measuring 5.9 x 2.5 cm. No appreciable air within this collection noted. Early developing infectious lesion in this area must be of concern given the increase in fluid in this area. This fluid collection is somewhat better defined than 2 days prior. Close clinical and imaging surveillance in this regard advised. 2. Postoperative soft tissue stranding in the right abdomen with slight fluid tracking into the right lateral conal fascia. No evidence suggesting abscess in these areas. 3. No bowel wall thickening or bowel obstruction. Appendix appears normal. 4. Nonobstructing calculi in each kidney. No hydronephrosis or ureteral  calculus. Urinary bladder wall thickness normal. Electronically Signed   By: Bretta Bang III M.D.   On: 05/13/2020 07:55   CT ABDOMEN PELVIS W CONTRAST  Result Date: 05/11/2020 CLINICAL DATA:  Laparoscopic cholecystectomy 05/09/2020, continued abdominal pain EXAM: CT ABDOMEN AND PELVIS WITH CONTRAST TECHNIQUE: Multidetector CT imaging of the abdomen and pelvis was performed using the standard protocol following bolus administration of intravenous contrast. CONTRAST:  OMNIPAQUE IOHEXOL 300 MG/ML  SOLN COMPARISON:  05/09/2020, 03/27/2019 FINDINGS: Lower chest: No acute pleural or parenchymal lung disease. Hepatobiliary: Postsurgical changes from cholecystectomy, with minimal fluid and fat stranding in the gallbladder fossa compatible with postsurgical change. No evidence of fluid collection or abscess. The liver is unremarkable without biliary dilation. Pancreas: Unremarkable. No pancreatic ductal dilatation or surrounding inflammatory changes. Spleen: Normal in size without focal abnormality. Adrenals/Urinary Tract: There are bilateral nonobstructing renal calculi. There are two 3 mm nonobstructing calculi on the right, with a single 3 mm calculus on the left. No obstructive uropathy within either kidney. The adrenals and bladder are unremarkable. Stomach/Bowel: No bowel obstruction or ileus. Normal appendix central upper pelvis. No bowel wall thickening or inflammatory change. Vascular/Lymphatic: Aortic atherosclerosis. No enlarged abdominal or pelvic lymph nodes. Reproductive: Uterus and bilateral adnexa are unremarkable. Other: There is a small amount of pneumoperitoneum within the upper abdomen consistent with recent laparoscopic surgery. Trace free fluid in the right upper quadrant and lower pelvis consistent with postsurgical change. Postsurgical change at the umbilicus from laparoscopy port. No fluid collection. No abdominal wall hernia. Musculoskeletal: No acute or destructive bony lesions.  Reconstructed images demonstrate no additional findings. IMPRESSION: 1. Expected postsurgical changes from laparoscopic cholecystectomy. No evidence of fluid collection or abscess. 2. Bilateral nonobstructing renal calculi. 3.  Aortic Atherosclerosis (ICD10-I70.0). Electronically Signed   By: Sharlet Salina M.D.   On: 05/11/2020 19:01   US Abdomen Limited  Result Date: 05/13/2020 CLINICAL DATA:  Pain post cholecystectomy EXAM: ULTRASOUND ABDOMEN LIMITED RIGHT UPPER QUADRANT COMPARISON:  05/11/2020, ultrasound 05/09/2020 FINDINGS: Gallbladder: Post cholecystectomy. Question some fluid towards the gallbladder fossa the exam was terminated prematurely due to patient discomfort. Common bile duct: Nonvisualized Liver: Incomplete assessment of the liver with  only partial visualization of the left lobe. Other: Exam was terminated prematurely by the patient due to inability to tolerate scanning procedure with exquisite pain and tenderness upon transducer pressure. IMPRESSION: Premature termination of the exam due to patient discomfort and inability to tolerate scanning procedure. Post cholecystectomy. Question some fluid towards the gallbladder fossa. Minimal visualization of the left lobe liver. Nonvisualization of the common bile duct. Electronically Signed   By: Kreg Shropshire M.D.   On: 05/13/2020 06:55   DG CHEST PORT 1 VIEW  Result Date: 05/13/2020 CLINICAL DATA:  One month postpartum with recent laparoscopic cholecystectomy. Right upper quadrant abdominal pain. EXAM: PORTABLE CHEST 1 VIEW COMPARISON:  None. FINDINGS: The heart size and mediastinal contours are within normal limits. Low lung volumes. No focal airspace disease. The visualized skeletal structures are unremarkable. IMPRESSION: No evidence of acute cardiopulmonary disease. Electronically Signed   By: Caprice Renshaw   On: 05/13/2020 09:40   NM HEPATOBILIARY LEAK (POST-SURGICAL)  Result Date: 05/13/2020 CLINICAL DATA:  Status post cholecystectomy with  right upper quadrant pain. EXAM: NUCLEAR MEDICINE HEPATOBILIARY IMAGING TECHNIQUE: Sequential images of the abdomen were obtained out to 60 minutes following intravenous administration of radiopharmaceutical. RADIOPHARMACEUTICALS:  4.9 mCi Tc-68m  Choletec IV COMPARISON:  CT 05/13/20 FINDINGS: Prompt uptake and biliary excretion of activity by the liver is seen. Biliary activity passes into small bowel, consistent with patent common bile duct. At the end of the first hour a small focus mild increased radiotracer accumulation is noted within the gallbladder fossa. During the second hour there is persistent and mildly progressive radiotracer accumulation within the gallbladder fossa. IMPRESSION: 1. Small focus of mild progressive radiotracer accumulation within the gallbladder fossa. Primary differential consideration includes: small contained leak versus radiotracer accumulation within gallbladder remnant. 2. Common bile duct appears patent with most of the radiotracer accumulating within the small bowel loops. 3. These results were shared via EPIC CHAT at the time of interpretation on 05/13/2020 at 12:53 pm to provider [facility] , who acknowledged these results. Electronically Signed   By: Signa Kell M.D.   On: 05/13/2020 12:53    Impression: Suspected bile leak postcholecystectomy Mild microcytic anemia, hemoglobin 10.9, MCV 79.5  Plan: Plan for ERCP with sphincterotomy and possible stent placement on 05/15/2020. Discussed about the risks and the benefits of the procedure with the patient and her father at bedside. Discussed plans with surgical team. Okay to start on clear liquids for now.   LOS: 0 days   Kerin Salen, MD  05/13/2020, 4:17 PM

## 2020-05-13 NOTE — ED Notes (Signed)
US at bedside

## 2020-05-13 NOTE — ED Provider Notes (Signed)
[facility]-EMERGENCY DEPT Provider Note   CSN: [id number] Arrival date & time: 05/13/20  1324     History Chief Complaint  Patient presents with  . Abdominal Pain    Jordan Chung is a 30 y.o. female.  Patient to ED for evaluation of abdominal pain. She underwent laparoscopic cholecystectomy on 5/2 and reports ongoing, severe pain since. She was seen in the ED 5/4 and reports negative evaluation. She felt better yesterday, but during the night tonight the pain again became severe. She has percocet at home that does not relieve symptoms. No fever, vomiting. She has been having regular bowel movements.   The history is provided by the patient. No language interpreter was used.  Abdominal Pain Associated symptoms: no chest pain, no chills, no constipation, no fever, no shortness of breath and no vomiting        Past Medical History:  Diagnosis Date  . Hyperemesis gravidarum   . Postpartum depression     There are no problems to display for this patient.   Past Surgical History:  Procedure Laterality Date  . CHOLECYSTECTOMY N/A 05/09/2020   Procedure: LAPAROSCOPIC CHOLECYSTECTOMY WITH INTRAOPERATIVE CHOLANGIOGRAM;  Surgeon: Abigail Miyamoto, MD;  Location: [facility]S;  Service: General;  Laterality: N/A;     OB History    Gravida  2   Para  1   Term  1   Preterm  0   AB  0   Living  1     SAB  0   IAB  0   Ectopic  0   Multiple  0   Live Births  1           No family history on file.  Social History   Tobacco Use  . Smoking status: Never Smoker  . Smokeless tobacco: Never Used  Substance Use Topics  . Alcohol use: Never  . Drug use: Never    Home Medications Prior to Admission medications   Medication Sig Start Date End Date Taking? Authorizing Provider  buPROPion (WELLBUTRIN XL) 300 MG 24 hr tablet Take 300 mg by mouth daily.    [provider]  docusate sodium (COLACE) 100 MG capsule Take 1 capsule (100 mg total)  by mouth 2 (two) times daily. 12/05/19 12/04/20  Rasch, Victorino Dike I, NP  Doxylamine-Pyridoxine (DICLEGIS PO) Take 10 mg by mouth.    [provider]  metoCLOPramide (REGLAN) 10 MG tablet Take 1 tablet (10 mg total) by mouth 4 (four) times daily -  before meals and at bedtime. 12/05/19 12/04/20  Rasch, Victorino Dike I, NP  omeprazole (PRILOSEC) 20 MG capsule Take 20 mg by mouth daily.    [provider]  ondansetron (ZOFRAN) 4 MG tablet Take 1 tablet (4 mg total) by mouth every 6 (six) hours. 05/11/20   Curatolo, Adam, DO  polyethylene glycol (MIRALAX / GLYCOLAX) 17 g packet Take 17 g by mouth daily.    [provider]  Prenatal Vit-Fe Fumarate-FA (MULTIVITAMIN-PRENATAL) 27-0.8 MG TABS tablet Take 1 tablet by mouth daily.    [provider]  promethazine (PHENERGAN) 25 MG tablet Take 25 mg by mouth 2 (two) times daily as needed for nausea or vomiting.    [provider]  traMADol (ULTRAM) 50 MG tablet Take 1 tablet (50 mg total) by mouth every 6 (six) hours as needed for moderate pain or severe pain. 05/09/20   Abigail Miyamoto, MD  dicyclomine (BENTYL) 20 MG tablet Take 1 tablet (20 mg total) by  mouth 2 (two) times daily. 03/27/19 10/06/19  Lorelee NewGreen, Garrett L, PA-C    Allergies    Patient has no known allergies.  Review of Systems   Review of Systems  Constitutional: Negative for chills and fever.  HENT: Negative.   Respiratory: Negative.  Negative for shortness of breath.   Cardiovascular: Negative.  Negative for chest pain.  Gastrointestinal: Positive for abdominal pain. Negative for constipation and vomiting.  Genitourinary: Negative.   Musculoskeletal: Negative.   Skin: Negative.   Neurological: Negative.     Physical Exam Updated Vital Signs BP (!) 115/57 (BP Location: Left Arm)   Pulse 73   Temp (!) 97.5 F (36.4 C) (Oral)   Resp (!) 22   Ht 5\' 8"  (1.727 m)   Wt 103.9 kg   LMP 07/23/2019 (Approximate) Comment: Gravida 2, Para 1  SpO2 100%    BMI 34.82 kg/m   Physical Exam Vitals and nursing note reviewed.  Constitutional:      Appearance: She is well-developed.  HENT:     Head: Normocephalic.  Cardiovascular:     Rate and Rhythm: Normal rate and regular rhythm.  Pulmonary:     Effort: Pulmonary effort is normal.     Breath sounds: Normal breath sounds.  Abdominal:     General: Bowel sounds are normal.     Palpations: Abdomen is soft.     Tenderness: There is abdominal tenderness. There is no guarding or rebound.     Comments: Generalized tenderness that is worse in the RUQ and epigastric areas.   Musculoskeletal:        General: Normal range of motion.     Cervical back: Normal range of motion and neck supple.  Skin:    General: Skin is warm and dry.     Findings: No rash.  Neurological:     Mental Status: She is alert.     Cranial Nerves: No cranial nerve deficit.     ED Results / Procedures / Treatments   Labs (all labs ordered are listed, but only abnormal results are displayed) Labs Reviewed  CBC WITH DIFFERENTIAL/PLATELET  COMPREHENSIVE METABOLIC PANEL  LIPASE, BLOOD   Results for orders placed or performed during the hospital encounter of 05/13/20  CBC with Differential  Result Value Ref Range   WBC 11.6 (H) 4.0 - 10.5 K/uL   RBC 4.52 3.87 - 5.11 MIL/uL   Hemoglobin 11.0 (L) 12.0 - 15.0 g/dL   HCT 16.135.4 (L) 09.636.0 - 04.546.0 %   MCV 78.3 (L) 80.0 - 100.0 fL   MCH 24.3 (L) 26.0 - 34.0 pg   MCHC 31.1 30.0 - 36.0 g/dL   RDW 40.917.3 (H) 81.111.5 - 91.415.5 %   Platelets 339 150 - 400 K/uL   nRBC 0.0 0.0 - 0.2 %   Neutrophils Relative % 62 %   Neutro Abs 7.2 1.7 - 7.7 K/uL   Lymphocytes Relative 30 %   Lymphs Abs 3.5 0.7 - 4.0 K/uL   Monocytes Relative 7 %   Monocytes Absolute 0.8 0.1 - 1.0 K/uL   Eosinophils Relative 1 %   Eosinophils Absolute 0.1 0.0 - 0.5 K/uL   Basophils Relative 0 %   Basophils Absolute 0.0 0.0 - 0.1 K/uL   Immature Granulocytes 0 %   Abs Immature Granulocytes 0.04 0.00 - 0.07 K/uL   Comprehensive metabolic panel  Result Value Ref Range   Sodium 136 135 - 145 mmol/L   Potassium 3.7 3.5 - 5.1 mmol/L   Chloride 103  98 - 111 mmol/L   CO2 24 22 - 32 mmol/L   Glucose, Bld 116 (H) 70 - 99 mg/dL   BUN 9 6 - 20 mg/dL   Creatinine, Ser 7.25 0.44 - 1.00 mg/dL   Calcium 9.0 8.9 - 36.6 mg/dL   Total Protein 6.9 6.5 - 8.1 g/dL   Albumin 3.5 3.5 - 5.0 g/dL   AST 16 15 - 41 U/L   ALT 21 0 - 44 U/L   Alkaline Phosphatase 91 38 - 126 U/L   Total Bilirubin 1.0 0.3 - 1.2 mg/dL   GFR, Estimated >44 >03 mL/min   Anion gap 9 5 - 15  Lipase, blood  Result Value Ref Range   Lipase 27 11 - 51 U/L   EKG None  Radiology CT ABDOMEN PELVIS W CONTRAST  Result Date: 05/11/2020 CLINICAL DATA:  Laparoscopic cholecystectomy 05/09/2020, continued abdominal pain EXAM: CT ABDOMEN AND PELVIS WITH CONTRAST TECHNIQUE: Multidetector CT imaging of the abdomen and pelvis was performed using the standard protocol following bolus administration of intravenous contrast. CONTRAST:  OMNIPAQUE IOHEXOL 300 MG/ML  SOLN COMPARISON:  05/09/2020, 03/27/2019 FINDINGS: Lower chest: No acute pleural or parenchymal lung disease. Hepatobiliary: Postsurgical changes from cholecystectomy, with minimal fluid and fat stranding in the gallbladder fossa compatible with postsurgical change. No evidence of fluid collection or abscess. The liver is unremarkable without biliary dilation. Pancreas: Unremarkable. No pancreatic ductal dilatation or surrounding inflammatory changes. Spleen: Normal in size without focal abnormality. Adrenals/Urinary Tract: There are bilateral nonobstructing renal calculi. There are two 3 mm nonobstructing calculi on the right, with a single 3 mm calculus on the left. No obstructive uropathy within either kidney. The adrenals and bladder are unremarkable. Stomach/Bowel: No bowel obstruction or ileus. Normal appendix central upper pelvis. No bowel wall thickening or inflammatory change.  Vascular/Lymphatic: Aortic atherosclerosis. No enlarged abdominal or pelvic lymph nodes. Reproductive: Uterus and bilateral adnexa are unremarkable. Other: There is a small amount of pneumoperitoneum within the upper abdomen consistent with recent laparoscopic surgery. Trace free fluid in the right upper quadrant and lower pelvis consistent with postsurgical change. Postsurgical change at the umbilicus from laparoscopy port. No fluid collection. No abdominal wall hernia. Musculoskeletal: No acute or destructive bony lesions. Reconstructed images demonstrate no additional findings. IMPRESSION: 1. Expected postsurgical changes from laparoscopic cholecystectomy. No evidence of fluid collection or abscess. 2. Bilateral nonobstructing renal calculi. 3.  Aortic Atherosclerosis (ICD10-I70.0). Electronically Signed   By: Sharlet Salina M.D.   On: 05/11/2020 19:01    Procedures Procedures   Medications Ordered in ED Medications  HYDROmorphone (DILAUDID) injection 1 mg (1 mg Intravenous Given 05/13/20 0546)  ondansetron (ZOFRAN) injection 4 mg (4 mg Intravenous Given 05/13/20 0545)    ED Course  I have reviewed the triage vital signs and the nursing notes.  Pertinent labs & imaging results that were available during my care of the patient were reviewed by me and considered in my medical decision making (see chart for details).    MDM Rules/Calculators/A&P                          Patient to ED with persistent post-operative abdominal pain as detailed in the HPI.  Patient is afebrile. She appears very uncomfortable. IV medications ordered. Will repeat labs studies. [place] ordered.   [place] attempted, however, the patient could not tolerate the pressure required to get an adequate study. Order discontinued. Will repeat the CT scan. Pain is not controlled,  minimally improved. Additional pain medication ordered.   Labs reviewed and are unchanged. No leukocytosis. CT scan shows a well defined fluid collection in the  gall bladder fossa. Findings were discussed with surgery PA, Leary Roca, who will consult for admission to the surgical service. Patient and family updated.   Final Clinical Impression(s) / ED Diagnoses Final diagnoses:  None   1 post-op pain 2.  Rx / DC Orders ED Discharge Orders    None       Elpidio Anis, PA-C 05/13/20 2409    Rozelle Logan, DO 05/21/20 1508

## 2020-05-13 NOTE — Progress Notes (Signed)
Patient had questions regarding procedure and course of treatment. [name] paged to call this patient. Made me aware she will call patient later.

## 2020-05-13 NOTE — ED Notes (Signed)
Family at bedside. 

## 2020-05-13 NOTE — ED Triage Notes (Signed)
Patient arrived vis EMS from home for abdominal pain 10/10. Patient reports having gall bladder surgery on Monday, having a fall on Wednesday (but not injuring the abdomin) and having increased pain since Wednesday. Pain is described as sharp and throbbing radiates to the right shoulder.

## 2020-05-13 NOTE — H&P (Signed)
Jordan Chung 23-Jun-1990  [id number].    Requesting MD: Elpidio Anis, PA-C Chief Complaint/Reason for Consult: RUQ abdominal pain  HPI: Jordan Chung is a 30 y.o. female with a hx of who is 1 month PP after SVD of a baby boy and recently underwent a laparoscopic cholecystectomy by Dr. Magnus Ivan on 5/2 who presented to [facility] ED with right upper quadrant abdominal pain.   As stated above, patient underwent laparoscopic cholecystectomy by Dr. Magnus Ivan on 5/2.  Patient was discharged from PACU.  Since returning home she reports she has had a constant, sharp pain in her right upper quadrant that was initially a 7/10 but became more severe last night.  She reports on 5/4 she got weak in the shower and her husband had helped her to the floor for she had a brief episode of syncope.  She denies head trauma.  No associated chest pain or shortness of breath.  She was seen in the ED for evaluation.  CT A/P showed expected postsurgical changes from laparoscopic cholecystectomy without fluid collection or abscess.  LFTs within normal limits.  WBC 12.2.  She was discharged home.  She notes that her pain continued to worsen after discharge from the ED despite Tramadol and Oxycodone with some radiation of sharp pain into her right shoulder.  She denies any chest pain, cough or shortness of breath.  Her symptoms were associated with chills. She did have 1 episode of emesis on Wednesday. No n/v since. Denies fevers. Was able to have small amount of grilled chicken last night without change in symptoms. In the ED today WBC 11.6.  LFTs within normal limits.  CT A/P shows fluid collection in the gallbladder fossa measuring 5.9 x 2.5 cm. We were asked to see.   ROS: Review of Systems  Constitutional: Positive for chills. Negative for fever.  Respiratory: Negative for cough and shortness of breath.   Cardiovascular: Negative for chest pain and leg swelling.  Gastrointestinal: Positive for abdominal pain, nausea and vomiting.  Negative for diarrhea.  Genitourinary: Negative for dysuria.  Musculoskeletal: Positive for joint pain (right shoulder pain).  Neurological: Positive for loss of consciousness.  Psychiatric/Behavioral: Negative for substance abuse.  All other systems reviewed and are negative.   No family history on file.  Past Medical History:  Diagnosis Date  . Hyperemesis gravidarum   . Postpartum depression     Past Surgical History:  Procedure Laterality Date  . CHOLECYSTECTOMY N/A 05/09/2020   Procedure: LAPAROSCOPIC CHOLECYSTECTOMY WITH INTRAOPERATIVE CHOLANGIOGRAM;  Surgeon: Abigail Miyamoto, MD;  Location: [facility] ORS;  Service: General;  Laterality: N/A;    Social History:  reports that she has never smoked. She has never used smokeless tobacco. She reports that she does not drink alcohol and does not use drugs.  Allergies: No Known Allergies  (Not in a hospital admission)    Physical Exam: Blood pressure (!) 115/56, pulse (!) 58, temperature 97.7 F (36.5 C), temperature source Oral, resp. rate (!) 25, height 5\' 8"  (1.727 m), weight 103.9 kg, last menstrual period 07/23/2019, SpO2 100 %, currently breastfeeding. (she is currently pumping and dumping) General: pleasant, WD/WN white female who is laying in bed in NAD HEENT: head is normocephalic, atraumatic.  Sclera are noninjected.  PERRL.  Ears and nose without any masses or lesions.  Mouth is pink and moist. Dentition fair Heart: regular, rate, and rhythm.  Normal s1,s2. No obvious murmurs, gallops, or rubs noted.  Palpable pedal pulses bilaterally  Lungs: CTAB, no wheezes,  rhonchi, or rales noted.  Respiratory effort nonlabored. On RA.  Abd: Soft, mild distension, tenderness of the epigastrium and RUQ with voluntary guarding. +BS, no masses, hernias, or organomegaly. Incisions with glue intact appears well and are without drainage, bleeding, or signs of infection MS: no BUE/BLE edema or tenderness, calves soft and nontender Skin: warm  and dry with no masses, lesions, or rashes Psych: A&Ox4 with an appropriate affect Neuro: cranial nerves grossly intact, equal strength in BUE/BLE bilaterally, normal speech, thought process intact   Results for orders placed or performed during the hospital encounter of 05/13/20 (from the past 48 hour(s))  CBC with Differential     Status: Abnormal   Collection Time: 05/13/20  5:49 AM  Result Value Ref Range   WBC 11.6 (H) 4.0 - 10.5 K/uL   RBC 4.52 3.87 - 5.11 MIL/uL   Hemoglobin 11.0 (L) 12.0 - 15.0 g/dL   HCT 16.135.4 (L) 09.636.0 - 04.546.0 %   MCV 78.3 (L) 80.0 - 100.0 fL   MCH 24.3 (L) 26.0 - 34.0 pg   MCHC 31.1 30.0 - 36.0 g/dL   RDW 40.917.3 (H) 81.111.5 - 91.415.5 %   Platelets 339 150 - 400 K/uL   nRBC 0.0 0.0 - 0.2 %   Neutrophils Relative % 62 %   Neutro Abs 7.2 1.7 - 7.7 K/uL   Lymphocytes Relative 30 %   Lymphs Abs 3.5 0.7 - 4.0 K/uL   Monocytes Relative 7 %   Monocytes Absolute 0.8 0.1 - 1.0 K/uL   Eosinophils Relative 1 %   Eosinophils Absolute 0.1 0.0 - 0.5 K/uL   Basophils Relative 0 %   Basophils Absolute 0.0 0.0 - 0.1 K/uL   Immature Granulocytes 0 %   Abs Immature Granulocytes 0.04 0.00 - 0.07 K/uL    Comment: Performed at [facility], [address] [place], [place] [address]  Comprehensive metabolic panel     Status: Abnormal   Collection Time: 05/13/20  5:49 AM  Result Value Ref Range   Sodium 136 135 - 145 mmol/L   Potassium 3.7 3.5 - 5.1 mmol/L   Chloride 103 98 - 111 mmol/L   CO2 24 22 - 32 mmol/L   Glucose, Bld 116 (H) 70 - 99 mg/dL    Comment: Glucose reference range applies only to samples taken after fasting for at least 8 hours.   BUN 9 6 - 20 mg/dL   Creatinine, Ser 6.210.85 0.44 - 1.00 mg/dL   Calcium 9.0 8.9 - 30.810.3 mg/dL   Total Protein 6.9 6.5 - 8.1 g/dL   Albumin 3.5 3.5 - 5.0 g/dL   AST 16 15 - 41 U/L   ALT 21 0 - 44 U/L   Alkaline Phosphatase 91 38 - 126 U/L   Total Bilirubin 1.0 0.3 - 1.2 mg/dL   GFR, Estimated >65>60 >78>60 mL/min     Comment: (NOTE) Calculated using the CKD-EPI Creatinine Equation (2021)    Anion gap 9 5 - 15    Comment: Performed at [facility], [address] [place], [place] [address]  Lipase, blood     Status: None   Collection Time: 05/13/20  5:49 AM  Result Value Ref Range   Lipase 27 11 - 51 U/L    Comment: Performed at [facility], [address] [place], [place] [address]   CT ABDOMEN PELVIS W CONTRAST  Result Date: 05/13/2020 CLINICAL DATA:  Recent cholecystectomy with pain EXAM: CT ABDOMEN AND PELVIS  WITH CONTRAST TECHNIQUE: Multidetector CT imaging of the abdomen and pelvis was performed using the standard protocol following bolus administration of intravenous contrast. CONTRAST:  OMNIPAQUE IOHEXOL 300 MG/ML  SOLN COMPARISON:  May 11, 2020 FINDINGS: Lower chest: There is bibasilar atelectasis. Lung bases otherwise are clear. Hepatobiliary: No focal liver lesions are appreciable. Gallbladder is absent. There is slightly more fluid in the gallbladder fossa region compared to 2 days prior with fluid collection in the gallbladder fossa region measuring 5.9 x 2.5 cm. This fluid collection is better defined than on study from 2 days prior. There is fat stranding in the medial gallbladder fossa region consistent with recent surgery, similar to 2 days prior. Trace air along the periphery of this fluid collection may be of postoperative etiology. A well-defined air collection within the fluid in the gallbladder fossa is not appreciable. Soft tissue stranding is noted along the lateral right abdomen consistent with recent surgery, essentially stable. There is no appreciable biliary duct dilatation. Pancreas: No pancreatic mass or inflammatory focus. Spleen: No splenic lesions are evident. Adrenals/Urinary Tract: Adrenals bilaterally appear normal. There is no evident mass or hydronephrosis on either side. There is a 2 mm calculus with an adjacent 3 mm calculus in the  mid right kidney, unchanged. There is a 2 mm calculus in the mid left kidney, unchanged. No ureteral calculus evident on either side. Urinary bladder is midline with wall thickness within normal limits. Stomach/Bowel: There is no bowel wall or mesenteric thickening. No evident bowel obstruction. Terminal ileum appears normal. Appendix appears normal. There is no evident free air or portal venous air. Small amount of pneumoperitoneum seen 2 days prior no longer evident. Vascular/Lymphatic: No abdominal aortic aneurysm. No arterial vascular lesions are evident. Major venous structures appear patent. There is a circumaortic left renal vein, an anatomic variant. No evident adenopathy in the abdomen or pelvis. Reproductive: Uterus is anteverted. No adnexal masses evident. There is a small amount of fluid in the cul-de-sac, stable. Other: No well-defined abscess in the abdomen or pelvis. Minimal fluid tracking along the right lateral conal fascia is likely secondary to recent surgery. No ascites beyond the mild fluid in the cul-de-sac. Musculoskeletal: No blastic or lytic bone lesions. No intramuscular lesions. Evidence of recent surgery at the umbilicus level. IMPRESSION: 1. There has been an increase in a well-defined fluid collection in the gallbladder fossa compared to 2 days prior with fluid in this area currently measuring 5.9 x 2.5 cm. No appreciable air within this collection noted. Early developing infectious lesion in this area must be of concern given the increase in fluid in this area. This fluid collection is somewhat better defined than 2 days prior. Close clinical and imaging surveillance in this regard advised. 2. Postoperative soft tissue stranding in the right abdomen with slight fluid tracking into the right lateral conal fascia. No evidence suggesting abscess in these areas. 3. No bowel wall thickening or bowel obstruction. Appendix appears normal. 4. Nonobstructing calculi in each kidney. No  hydronephrosis or ureteral calculus. Urinary bladder wall thickness normal. Electronically Signed   By: Bretta Bang III M.D.   On: 05/13/2020 07:55   CT ABDOMEN PELVIS W CONTRAST  Result Date: 05/11/2020 CLINICAL DATA:  Laparoscopic cholecystectomy 05/09/2020, continued abdominal pain EXAM: CT ABDOMEN AND PELVIS WITH CONTRAST TECHNIQUE: Multidetector CT imaging of the abdomen and pelvis was performed using the standard protocol following bolus administration of intravenous contrast. CONTRAST:  OMNIPAQUE IOHEXOL 300 MG/ML  SOLN COMPARISON:  05/09/2020, 03/27/2019  FINDINGS: Lower chest: No acute pleural or parenchymal lung disease. Hepatobiliary: Postsurgical changes from cholecystectomy, with minimal fluid and fat stranding in the gallbladder fossa compatible with postsurgical change. No evidence of fluid collection or abscess. The liver is unremarkable without biliary dilation. Pancreas: Unremarkable. No pancreatic ductal dilatation or surrounding inflammatory changes. Spleen: Normal in size without focal abnormality. Adrenals/Urinary Tract: There are bilateral nonobstructing renal calculi. There are two 3 mm nonobstructing calculi on the right, with a single 3 mm calculus on the left. No obstructive uropathy within either kidney. The adrenals and bladder are unremarkable. Stomach/Bowel: No bowel obstruction or ileus. Normal appendix central upper pelvis. No bowel wall thickening or inflammatory change. Vascular/Lymphatic: Aortic atherosclerosis. No enlarged abdominal or pelvic lymph nodes. Reproductive: Uterus and bilateral adnexa are unremarkable. Other: There is a small amount of pneumoperitoneum within the upper abdomen consistent with recent laparoscopic surgery. Trace free fluid in the right upper quadrant and lower pelvis consistent with postsurgical change. Postsurgical change at the umbilicus from laparoscopy port. No fluid collection. No abdominal wall hernia. Musculoskeletal: No acute or  destructive bony lesions. Reconstructed images demonstrate no additional findings. IMPRESSION: 1. Expected postsurgical changes from laparoscopic cholecystectomy. No evidence of fluid collection or abscess. 2. Bilateral nonobstructing renal calculi. 3.  Aortic Atherosclerosis (ICD10-I70.0). Electronically Signed   By: Sharlet Salina M.D.   On: 05/11/2020 19:01   US Abdomen Limited  Result Date: 05/13/2020 CLINICAL DATA:  Pain post cholecystectomy EXAM: ULTRASOUND ABDOMEN LIMITED RIGHT UPPER QUADRANT COMPARISON:  05/11/2020, ultrasound 05/09/2020 FINDINGS: Gallbladder: Post cholecystectomy. Question some fluid towards the gallbladder fossa the exam was terminated prematurely due to patient discomfort. Common bile duct: Nonvisualized Liver: Incomplete assessment of the liver with only partial visualization of the left lobe. Other: Exam was terminated prematurely by the patient due to inability to tolerate scanning procedure with exquisite pain and tenderness upon transducer pressure. IMPRESSION: Premature termination of the exam due to patient discomfort and inability to tolerate scanning procedure. Post cholecystectomy. Question some fluid towards the gallbladder fossa. Minimal visualization of the left lobe liver. Nonvisualization of the common bile duct. Electronically Signed   By: Kreg Shropshire M.D.   On: 05/13/2020 06:55    Anti-infectives (From admission, onward)   None       Assessment/Plan 1 month PP after SVD of a baby boy - patient currently pumping and dumping  Recent syncopal episode on 5/4 - EKG and CXR to start.  R shoulder pain - ? from gas from surgery. Denies CP or SOB. On RA. No tachycardia or hypoxia. Start with EKG and CXR.   POD #4 S/p laparoscopic cholecystectomy by Dr. Magnus Ivan on 5/2.  RUQ abdominal pain Fluid collection in the gallbladder fossa measuring 5.9 x 2.5 cm - Will plan for HIDA scan to r/o leak. If positive will ask GI to perform ERCP - If HIDA negative, will plan  to have IR team aspirate/place drain to determine characteristic of fluid.  - NPO - Admit to inpateint  FEN - NPO, IVF VTE - SCDs, Loveonx ID - None currently   Jacinto Halim, [facility] 05/13/2020, 9:03 AM Please see Amion for pager number during day hours 7:00am-4:30pm

## 2020-05-14 ENCOUNTER — Inpatient Hospital Stay (HOSPITAL_COMMUNITY): Payer: Medicaid Other

## 2020-05-14 LAB — COMPREHENSIVE METABOLIC PANEL
ALT: 19 U/L (ref 0–44)
AST: 15 U/L (ref 15–41)
Albumin: 3.1 g/dL — ABNORMAL LOW (ref 3.5–5.0)
Alkaline Phosphatase: 81 U/L (ref 38–126)
Anion gap: 7 (ref 5–15)
BUN: 6 mg/dL (ref 6–20)
CO2: 26 mmol/L (ref 22–32)
Calcium: 8.5 mg/dL — ABNORMAL LOW (ref 8.9–10.3)
Chloride: 106 mmol/L (ref 98–111)
Creatinine, Ser: 0.88 mg/dL (ref 0.44–1.00)
GFR, Estimated: >60 mL/min (ref >60)
Glucose, Bld: 101 mg/dL — ABNORMAL HIGH (ref 70–99)
Potassium: 4.1 mmol/L (ref 3.5–5.1)
Sodium: 139 mmol/L (ref 135–145)
Total Bilirubin: 0.8 mg/dL (ref 0.3–1.2)
Total Protein: 6.1 g/dL — ABNORMAL LOW (ref 6.5–8.1)

## 2020-05-14 LAB — CBC
HCT: 32.6 % — ABNORMAL LOW (ref 36.0–46.0)
Hemoglobin: 9.8 g/dL — ABNORMAL LOW (ref 12.0–15.0)
MCH: 24.1 pg — ABNORMAL LOW (ref 26.0–34.0)
MCHC: 30.1 g/dL (ref 30.0–36.0)
MCV: 80.3 fL (ref 80.0–100.0)
Platelets: 273 10*3/uL (ref 150–400)
RBC: 4.06 MIL/uL (ref 3.87–5.11)
RDW: 17.2 % — ABNORMAL HIGH (ref 11.5–15.5)
WBC: 8.3 10*3/uL (ref 4.0–10.5)
nRBC: 0 % (ref 0.0–0.2)

## 2020-05-14 LAB — HIV ANTIBODY (ROUTINE TESTING W REFLEX): HIV Screen 4th Generation wRfx: NONREACTIVE

## 2020-05-14 MED ORDER — FLUMAZENIL 0.5 MG/5ML IV SOLN
INTRAVENOUS | Status: AC
  Filled 2020-05-14: qty 5

## 2020-05-14 MED ORDER — NALOXONE HCL 0.4 MG/ML IJ SOLN
INTRAMUSCULAR | Status: AC
  Filled 2020-05-14: qty 1

## 2020-05-14 MED ORDER — SODIUM CHLORIDE 0.9% FLUSH
5.0000 mL | Freq: Three times a day (TID) | INTRAVENOUS | Status: DC
  Administered 2020-05-14 – 2020-05-17 (×8): 5 mL

## 2020-05-14 MED ORDER — HYDROMORPHONE HCL 1 MG/ML IJ SOLN
1.0000 mg | INTRAMUSCULAR | Status: DC | PRN
  Administered 2020-05-14 – 2020-05-15 (×7): 1 mg via INTRAVENOUS
  Filled 2020-05-14 (×7): qty 1

## 2020-05-14 MED ORDER — FENTANYL CITRATE (PF) 100 MCG/2ML IJ SOLN
INTRAMUSCULAR | Status: AC
  Filled 2020-05-14: qty 2

## 2020-05-14 MED ORDER — MIDAZOLAM HCL 2 MG/2ML IJ SOLN
INTRAMUSCULAR | Status: AC
  Filled 2020-05-14: qty 2

## 2020-05-14 MED ORDER — MIDAZOLAM HCL 2 MG/2ML IJ SOLN
INTRAMUSCULAR | Status: AC | PRN
  Administered 2020-05-14 (×2): 1 mg via INTRAVENOUS
  Administered 2020-05-14 (×2): 0.5 mg via INTRAVENOUS

## 2020-05-14 MED ORDER — HYDROMORPHONE HCL 1 MG/ML IJ SOLN
1.0000 mg | Freq: Once | INTRAMUSCULAR | Status: AC
  Administered 2020-05-14: 1 mg via INTRAVENOUS
  Filled 2020-05-14: qty 1

## 2020-05-14 MED ORDER — OXYCODONE HCL 5 MG PO TABS
5.0000 mg | ORAL_TABLET | ORAL | Status: DC | PRN
  Administered 2020-05-14: 10 mg via ORAL
  Administered 2020-05-14: 5 mg via ORAL
  Administered 2020-05-14 – 2020-05-15 (×2): 10 mg via ORAL
  Administered 2020-05-15: 5 mg via ORAL
  Filled 2020-05-14 (×3): qty 2
  Filled 2020-05-14: qty 1
  Filled 2020-05-14: qty 2

## 2020-05-14 MED ORDER — FENTANYL CITRATE (PF) 100 MCG/2ML IJ SOLN
INTRAMUSCULAR | Status: AC | PRN
  Administered 2020-05-14: 25 ug via INTRAVENOUS
  Administered 2020-05-14: 50 ug via INTRAVENOUS
  Administered 2020-05-14: 25 ug via INTRAVENOUS

## 2020-05-14 NOTE — Consult Note (Signed)
Chief Complaint: Patient was seen in consultation today for post-operative biloma  Referring Physician(s): Harriette Bouillon, MD  Patient Status: [facility] - In-pt  History of Present Illness: Jordan Chung is a 30 y.o. female, 1 month post-partum, status post laparoscopic cholecystectomy on 05/09/20 for symptomatic cholelithiasis with worsening right upper quadrant abdominal pain and imaging evidence of enlarging biloma in the gallbladder fossa.  She endorses severe right upper quadrant pain which radiates to her right shoulder, not significantly improved with narcotics.  No clinical evidence of superimposed infection.  She is NPO today.   Past Medical History:  Diagnosis Date  . Hyperemesis gravidarum   . Postpartum depression     Past Surgical History:  Procedure Laterality Date  . CHOLECYSTECTOMY N/A 05/09/2020   Procedure: LAPAROSCOPIC CHOLECYSTECTOMY WITH INTRAOPERATIVE CHOLANGIOGRAM;  Surgeon: Abigail Miyamoto, MD;  Location: [facility];  Service: General;  Laterality: N/A;    Allergies: Patient has no known allergies.  Medications: Prior to Admission medications   Medication Sig Start Date End Date Taking? Authorizing Provider  docusate sodium (COLACE) 100 MG capsule Take 1 capsule (100 mg total) by mouth 2 (two) times daily. Patient taking differently: Take 100 mg by mouth daily as needed for mild constipation. 12/05/19 12/04/20 Yes Rasch, Victorino Dike I, NP  oxyCODONE (OXY IR/ROXICODONE) 5 MG immediate release tablet Take 5 mg by mouth every 6 (six) hours as needed for pain. 05/10/20  Yes [provider]  polyethylene glycol (MIRALAX / GLYCOLAX) 17 g packet Take 17 g by mouth daily as needed for mild constipation.   Yes [provider]  Prenatal Vit-Fe Fumarate-FA (MULTIVITAMIN-PRENATAL) 27-0.8 MG TABS tablet Take 1 tablet by mouth daily.   Yes [provider]  traMADol (ULTRAM) 50 MG tablet Take 1 tablet (50 mg total) by mouth every 6 (six) hours as needed for  moderate pain or severe pain. 05/09/20  Yes Abigail Miyamoto, MD  metoCLOPramide (REGLAN) 10 MG tablet Take 1 tablet (10 mg total) by mouth 4 (four) times daily -  before meals and at bedtime. Patient not taking: No sig reported 12/05/19 12/04/20  Rasch, Victorino Dike I, NP  ondansetron (ZOFRAN) 4 MG tablet Take 1 tablet (4 mg total) by mouth every 6 (six) hours. 05/11/20   Curatolo, Adam, DO  dicyclomine (BENTYL) 20 MG tablet Take 1 tablet (20 mg total) by mouth 2 (two) times daily. 03/27/19 10/06/19  Lorelee New, PA-C     History reviewed. No pertinent family history.  Social History   Socioeconomic History  . Marital status: Divorced    Spouse name: Not on file  . Number of children: Not on file  . Years of education: Not on file  . Highest education level: Not on file  Occupational History  . Not on file  Tobacco Use  . Smoking status: Never Smoker  . Smokeless tobacco: Never Used  Substance and Sexual Activity  . Alcohol use: Never  . Drug use: Never  . Sexual activity: Not on file  Other Topics Concern  . Not on file  Social History Narrative  . Not on file   Social Determinants of Health   Financial Resource Strain: Not on file  Food Insecurity: Not on file  Transportation Needs: Not on file  Physical Activity: Not on file  Stress: Not on file  Social Connections: Not on file    Review of Systems: A 12 point ROS discussed and pertinent positives are indicated in the HPI above.  All other systems are negative.  Vital Signs: BP (!) 150/106 (BP Location: Right Arm)   Pulse 73   Temp 98.6 F (37 C) (Oral)   Resp 18   Ht 5\' 8"  (1.727 m)   Wt 103.9 kg   LMP 07/23/2019 (Approximate) Comment: Gravida 2, Para 1  SpO2 96%   BMI 34.82 kg/m   Physical Exam  Imaging: CT AP 05/13/20   NM HB study 05/13/20   Labs:  CBC: Recent Labs    05/09/20 0428 05/11/20 1734 05/13/20 0549 05/14/20 0530  WBC 10.1 12.2* 11.6* 8.3  HGB 10.9* 11.1* 11.0* 9.8*  HCT 35.6*  35.5* 35.4* 32.6*  PLT 338 309 339 273    COAGS: No results for input(s): INR, APTT in the last 8760 hours.  BMP: Recent Labs    10/05/19 2227 05/09/20 0428 05/11/20 1734 05/13/20 0549 05/14/20 0530  NA 138 138 136 136 139  K 3.6 3.9 3.7 3.7 4.1  CL 107 104 102 103 106  CO2 22 25 25 24 26   GLUCOSE 108* 107* 109* 116* 101*  BUN 5* 14 9 9 6   CALCIUM 8.6* 8.9 8.7* 9.0 8.5*  CREATININE 0.54 0.95 0.79 0.85 0.88  GFRNONAA >60 >60 >60 >60 >60  GFRAA >60  --   --   --   --     LIVER FUNCTION TESTS: Recent Labs    05/09/20 0428 05/11/20 1734 05/13/20 0549 05/14/20 0530  BILITOT 0.4 0.6 1.0 0.8  AST 18 26 16 15   ALT 15 26 21 19   ALKPHOS 72 83 91 81  PROT 6.5 6.6 6.9 6.1*  ALBUMIN 3.4* 3.3* 3.5 3.1*     Assessment and Plan: 30 year old female with history of laparoscopic cholecystectomy on 05/09/20 for symptomatic cholelithiasis complicated post-operatively by enlarging, symptomatic biloma in the gallbladder fossa.  Plan to proceed with CT guided biloma aspiration and drain placement with moderate sedation.    Risks and benefits discussed with the patient including bleeding, infection, damage to adjacent structures, bowel perforation/fistula connection, and sepsis.  All of the patient's questions were answered, patient is agreeable to proceed. Consent signed and in chart.    Electronically Signed: 07/14/20, MD 05/14/2020, 1:02 PM   I spent a total of 40 Minutes  in face to face in clinical consultation, greater than 50% of which was counseling/coordinating care for post-operative biloma.

## 2020-05-14 NOTE — Progress Notes (Signed)
Subjective/Chief Complaint: Patient walking halls.  She is in good spirits and relatively comfortable with minimal abdominal pain.   Objective: Vital signs in last 24 hours: Temp:  [98 F (36.7 C)-98.1 F (36.7 C)] 98.1 F (36.7 C) (05/07 0619) Pulse Rate:  [51-71] 51 (05/07 0619) Resp:  [16-18] 18 (05/07 0619) BP: (98-114)/(51-74) 110/51 (05/07 0619) SpO2:  [98 %-100 %] 98 % (05/07 0619)    Intake/Output from previous day: 05/06 0701 - 05/07 0700 In: 2573 [P.O.:1080; I.V.:1493] Out: 0  Intake/Output this shift: No intake/output data recorded.  General appearance: alert and cooperative Resp: clear to auscultation bilaterally Cardio: regular rate and rhythm, S1, S2 normal, no murmur, click, rub or gallop Incision/Wound: Port sites clean dry intact.  No evidence of peritonitis  Lab Results:  Recent Labs    05/13/20 0549 05/14/20 0530  WBC 11.6* 8.3  HGB 11.0* 9.8*  HCT 35.4* 32.6*  PLT 339 273   BMET Recent Labs    05/13/20 0549 05/14/20 0530  NA 136 139  K 3.7 4.1  CL 103 106  CO2 24 26  GLUCOSE 116* 101*  BUN 9 6  CREATININE 0.85 0.88  CALCIUM 9.0 8.5*   PT/INR No results for input(s): LABPROT, INR in the last 72 hours. ABG No results for input(s): PHART, HCO3 in the last 72 hours.  Invalid input(s): PCO2, PO2  Studies/Results: CT ABDOMEN PELVIS W CONTRAST  Result Date: 05/13/2020 CLINICAL DATA:  Recent cholecystectomy with pain EXAM: CT ABDOMEN AND PELVIS WITH CONTRAST TECHNIQUE: Multidetector CT imaging of the abdomen and pelvis was performed using the standard protocol following bolus administration of intravenous contrast. CONTRAST:  OMNIPAQUE IOHEXOL 300 MG/ML  SOLN COMPARISON:  May 11, 2020 FINDINGS: Lower chest: There is bibasilar atelectasis. Lung bases otherwise are clear. Hepatobiliary: No focal liver lesions are appreciable. Gallbladder is absent. There is slightly more fluid in the gallbladder fossa region compared to 2 days prior  with fluid collection in the gallbladder fossa region measuring 5.9 x 2.5 cm. This fluid collection is better defined than on study from 2 days prior. There is fat stranding in the medial gallbladder fossa region consistent with recent surgery, similar to 2 days prior. Trace air along the periphery of this fluid collection may be of postoperative etiology. A well-defined air collection within the fluid in the gallbladder fossa is not appreciable. Soft tissue stranding is noted along the lateral right abdomen consistent with recent surgery, essentially stable. There is no appreciable biliary duct dilatation. Pancreas: No pancreatic mass or inflammatory focus. Spleen: No splenic lesions are evident. Adrenals/Urinary Tract: Adrenals bilaterally appear normal. There is no evident mass or hydronephrosis on either side. There is a 2 mm calculus with an adjacent 3 mm calculus in the mid right kidney, unchanged. There is a 2 mm calculus in the mid left kidney, unchanged. No ureteral calculus evident on either side. Urinary bladder is midline with wall thickness within normal limits. Stomach/Bowel: There is no bowel wall or mesenteric thickening. No evident bowel obstruction. Terminal ileum appears normal. Appendix appears normal. There is no evident free air or portal venous air. Small amount of pneumoperitoneum seen 2 days prior no longer evident. Vascular/Lymphatic: No abdominal aortic aneurysm. No arterial vascular lesions are evident. Major venous structures appear patent. There is a circumaortic left renal vein, an anatomic variant. No evident adenopathy in the abdomen or pelvis. Reproductive: Uterus is anteverted. No adnexal masses evident. There is a small amount of fluid in the cul-de-sac, stable.  Other: No well-defined abscess in the abdomen or pelvis. Minimal fluid tracking along the right lateral conal fascia is likely secondary to recent surgery. No ascites beyond the mild fluid in the cul-de-sac.  Musculoskeletal: No blastic or lytic bone lesions. No intramuscular lesions. Evidence of recent surgery at the umbilicus level. IMPRESSION: 1. There has been an increase in a well-defined fluid collection in the gallbladder fossa compared to 2 days prior with fluid in this area currently measuring 5.9 x 2.5 cm. No appreciable air within this collection noted. Early developing infectious lesion in this area must be of concern given the increase in fluid in this area. This fluid collection is somewhat better defined than 2 days prior. Close clinical and imaging surveillance in this regard advised. 2. Postoperative soft tissue stranding in the right abdomen with slight fluid tracking into the right lateral conal fascia. No evidence suggesting abscess in these areas. 3. No bowel wall thickening or bowel obstruction. Appendix appears normal. 4. Nonobstructing calculi in each kidney. No hydronephrosis or ureteral calculus. Urinary bladder wall thickness normal. Electronically Signed   By: Bretta Bang III M.D.   On: 05/13/2020 07:55   US Abdomen Limited  Result Date: 05/13/2020 CLINICAL DATA:  Pain post cholecystectomy EXAM: ULTRASOUND ABDOMEN LIMITED RIGHT UPPER QUADRANT COMPARISON:  05/11/2020, ultrasound 05/09/2020 FINDINGS: Gallbladder: Post cholecystectomy. Question some fluid towards the gallbladder fossa the exam was terminated prematurely due to patient discomfort. Common bile duct: Nonvisualized Liver: Incomplete assessment of the liver with only partial visualization of the left lobe. Other: Exam was terminated prematurely by the patient due to inability to tolerate scanning procedure with exquisite pain and tenderness upon transducer pressure. IMPRESSION: Premature termination of the exam due to patient discomfort and inability to tolerate scanning procedure. Post cholecystectomy. Question some fluid towards the gallbladder fossa. Minimal visualization of the left lobe liver. Nonvisualization of the  common bile duct. Electronically Signed   By: Kreg Shropshire M.D.   On: 05/13/2020 06:55   DG CHEST PORT 1 VIEW  Result Date: 05/13/2020 CLINICAL DATA:  One month postpartum with recent laparoscopic cholecystectomy. Right upper quadrant abdominal pain. EXAM: PORTABLE CHEST 1 VIEW COMPARISON:  None. FINDINGS: The heart size and mediastinal contours are within normal limits. Low lung volumes. No focal airspace disease. The visualized skeletal structures are unremarkable. IMPRESSION: No evidence of acute cardiopulmonary disease. Electronically Signed   By: Caprice Renshaw   On: 05/13/2020 09:40   NM HEPATOBILIARY LEAK (POST-SURGICAL)  Result Date: 05/13/2020 CLINICAL DATA:  Status post cholecystectomy with right upper quadrant pain. EXAM: NUCLEAR MEDICINE HEPATOBILIARY IMAGING TECHNIQUE: Sequential images of the abdomen were obtained out to 60 minutes following intravenous administration of radiopharmaceutical. RADIOPHARMACEUTICALS:  4.9 mCi Tc-87m  Choletec IV COMPARISON:  CT 05/13/20 FINDINGS: Prompt uptake and biliary excretion of activity by the liver is seen. Biliary activity passes into small bowel, consistent with patent common bile duct. At the end of the first hour a small focus mild increased radiotracer accumulation is noted within the gallbladder fossa. During the second hour there is persistent and mildly progressive radiotracer accumulation within the gallbladder fossa. IMPRESSION: 1. Small focus of mild progressive radiotracer accumulation within the gallbladder fossa. Primary differential consideration includes: small contained leak versus radiotracer accumulation within gallbladder remnant. 2. Common bile duct appears patent with most of the radiotracer accumulating within the small bowel loops. 3. These results were shared via EPIC CHAT at the time of interpretation on 05/13/2020 at 12:53 pm to provider [facility] , who  acknowledged these results. Electronically Signed   By: Signa Kell M.D.   On:  05/13/2020 12:53    Anti-infectives: Anti-infectives (From admission, onward)   None      Assessment/Plan: POD #4 S/p laparoscopic cholecystectomy by Dr. Magnus Ivan on 5/2.  RUQ abdominal pain Fluid collection in the gallbladder fossa measuring 5.9 x 2.5 cm  Plan for ERCP tomorrow.  She is walking the halls and relatively comfortable at this point in time.  Her diet can be advanced after her ERCP is complete tomorrow.  I discussed this with her and her family at the bedside.  They voiced her understanding.   LOS: 1 day    Dortha Schwalbe MD  05/14/2020

## 2020-05-14 NOTE — Procedures (Signed)
Interventional Radiology Procedure Note  Procedure: CT guided biloma drain placement  Findings: Please refer to procedural dictation for full description.  Percutaneous, transhepatic placement of 10.2 Fr pigtail within gallbladder fossa fluid collection.  Approximately 20 mL dark, maroon colored fluid aspirated and sent for culture.  Drain placed to bulb suction.  Complications: None immediate  Estimated Blood Loss: < 5 mL  Recommendations: Keep to bulb suction for now.  IR will follow.   Marliss Coots, MD Pager: [phone]

## 2020-05-14 NOTE — Progress Notes (Signed)
Subjective: Patient complains of severe right upper quadrant and generalized abdominal pain, 20 out of 10 in intensity. She has received Dilaudid, morphine and oxycodone, and states the pain is still 10 out of 10. IR consult has been placed for possible catheter placement and drainage of bile leak.  Objective: Vital signs in last 24 hours: Temp:  [98 F (36.7 C)-98.6 F (37 C)] 98.6 F (37 C) (05/07 1102) Pulse Rate:  [51-73] 73 (05/07 1102) Resp:  [16-18] 18 (05/07 1102) BP: (98-150)/(51-106) 150/106 (05/07 1102) SpO2:  [96 %-100 %] 96 % (05/07 1102) Weight change:     PE: In obvious distress GENERAL: Able to speak in full words, no icterus, mild pallor ABDOMEN: Generalized tenderness, mild rebound tenderness, bowel sounds sluggish EXTREMITIES: No edema  Lab Results: Results for orders placed or performed during the hospital encounter of 05/13/20 (from the past 48 hour(s))  CBC with Differential     Status: Abnormal   Collection Time: 05/13/20  5:49 AM  Result Value Ref Range   WBC 11.6 (H) 4.0 - 10.5 K/uL   RBC 4.52 3.87 - 5.11 MIL/uL   Hemoglobin 11.0 (L) 12.0 - 15.0 g/dL   HCT 35.4 (L) 36.0 - 46.0 %   MCV 78.3 (L) 80.0 - 100.0 fL   MCH 24.3 (L) 26.0 - 34.0 pg   MCHC 31.1 30.0 - 36.0 g/dL   RDW 17.3 (H) 11.5 - 15.5 %   Platelets 339 150 - 400 K/uL   nRBC 0.0 0.0 - 0.2 %   Neutrophils Relative % 62 %   Neutro Abs 7.2 1.7 - 7.7 K/uL   Lymphocytes Relative 30 %   Lymphs Abs 3.5 0.7 - 4.0 K/uL   Monocytes Relative 7 %   Monocytes Absolute 0.8 0.1 - 1.0 K/uL   Eosinophils Relative 1 %   Eosinophils Absolute 0.1 0.0 - 0.5 K/uL   Basophils Relative 0 %   Basophils Absolute 0.0 0.0 - 0.1 K/uL   Immature Granulocytes 0 %   Abs Immature Granulocytes 0.04 0.00 - 0.07 K/uL    Comment: Performed at [facility], [address] [place], [place] [address]  Comprehensive metabolic panel     Status: Abnormal   Collection Time: 05/13/20  5:49 AM  Result Value  Ref Range   Sodium 136 135 - 145 mmol/L   Potassium 3.7 3.5 - 5.1 mmol/L   Chloride 103 98 - 111 mmol/L   CO2 24 22 - 32 mmol/L   Glucose, Bld 116 (H) 70 - 99 mg/dL    Comment: Glucose reference range applies only to samples taken after fasting for at least 8 hours.   BUN 9 6 - 20 mg/dL   Creatinine, Ser 0.85 0.44 - 1.00 mg/dL   Calcium 9.0 8.9 - 10.3 mg/dL   Total Protein 6.9 6.5 - 8.1 g/dL   Albumin 3.5 3.5 - 5.0 g/dL   AST 16 15 - 41 U/L   ALT 21 0 - 44 U/L   Alkaline Phosphatase 91 38 - 126 U/L   Total Bilirubin 1.0 0.3 - 1.2 mg/dL   GFR, Estimated >60 >60 mL/min    Comment: (NOTE) Calculated using the CKD-EPI Creatinine Equation (2021)    Anion gap 9 5 - 15    Comment: Performed at [facility], [address] [place], [place] [address]  Lipase, blood     Status: None   Collection Time: 05/13/20  5:49 AM  Result Value Ref Range     Lipase 27 11 - 51 U/L    Comment: Performed at [facility], [address], [place], [place] [address]  Resp Panel by RT-PCR (Flu A&B, Covid) Nasopharyngeal Swab     Status: None   Collection Time: 05/13/20  9:28 AM   Specimen: Nasopharyngeal Swab; Nasopharyngeal(NP) swabs in vial transport medium  Result Value Ref Range   SARS Coronavirus 2 by RT PCR NEGATIVE NEGATIVE    Comment: (NOTE) SARS-CoV-2 target nucleic acids are NOT DETECTED.  The SARS-CoV-2 RNA is generally detectable in upper respiratory specimens during the acute phase of infection. The lowest concentration of SARS-CoV-2 viral copies this assay can detect is 138 copies/mL. A negative result does not preclude SARS-Cov-2 infection and should not be used as the sole basis for treatment or other patient management decisions. A negative result may occur with  improper specimen collection/handling, submission of specimen other than nasopharyngeal swab, presence of viral mutation(s) within the areas targeted by this assay, and inadequate number of  viral copies(<138 copies/mL). A negative result must be combined with clinical observations, patient history, and epidemiological information. The expected result is Negative.  Fact Sheet for Patients:  BloggerCourse.com  Fact Sheet for Healthcare Providers:  SeriousBroker.it  This test is no t yet approved or cleared by the [place] FDA and  has been authorized for detection and/or diagnosis of SARS-CoV-2 by FDA under an Emergency Use Authorization (EUA). This EUA will remain  in effect (meaning this test can be used) for the duration of the COVID-19 declaration under Section 564(b)(1) of the Act, 21 U.S.C.section 360bbb-3(b)(1), unless the authorization is terminated  or revoked sooner.       Influenza A by PCR NEGATIVE NEGATIVE   Influenza B by PCR NEGATIVE NEGATIVE    Comment: (NOTE) The Xpert Xpress SARS-CoV-2/FLU/RSV plus assay is intended as an aid in the diagnosis of influenza from Nasopharyngeal swab specimens and should not be used as a sole basis for treatment. Nasal washings and aspirates are unacceptable for Xpert Xpress SARS-CoV-2/FLU/RSV testing.  Fact Sheet for Patients: BloggerCourse.com  Fact Sheet for Healthcare Providers: SeriousBroker.it  This test is not yet approved or cleared by the [place] FDA and has been authorized for detection and/or diagnosis of SARS-CoV-2 by FDA under an Emergency Use Authorization (EUA). This EUA will remain in effect (meaning this test can be used) for the duration of the COVID-19 declaration under Section 564(b)(1) of the Act, 21 U.S.C. section 360bbb-3(b)(1), unless the authorization is terminated or revoked.  Performed at [facility], [address] [place], [place] [address]   Comprehensive metabolic panel     Status: Abnormal   Collection Time: 05/14/20  5:30 AM  Result Value Ref Range    Sodium 139 135 - 145 mmol/L   Potassium 4.1 3.5 - 5.1 mmol/L   Chloride 106 98 - 111 mmol/L   CO2 26 22 - 32 mmol/L   Glucose, Bld 101 (H) 70 - 99 mg/dL    Comment: Glucose reference range applies only to samples taken after fasting for at least 8 hours.   BUN 6 6 - 20 mg/dL   Creatinine, Ser 1.44 0.44 - 1.00 mg/dL   Calcium 8.5 (L) 8.9 - 10.3 mg/dL   Total Protein 6.1 (L) 6.5 - 8.1 g/dL   Albumin 3.1 (L) 3.5 - 5.0 g/dL   AST 15 15 - 41 U/L   ALT 19 0 - 44 U/L   Alkaline Phosphatase 81 38 - 126 U/L  Total Bilirubin 0.8 0.3 - 1.2 mg/dL   GFR, Estimated >72 >09 mL/min    Comment: (NOTE) Calculated using the CKD-EPI Creatinine Equation (2021)    Anion gap 7 5 - 15    Comment: Performed at [facility], [address] [place] [address] [place], [place] [address]  CBC     Status: Abnormal   Collection Time: 05/14/20  5:30 AM  Result Value Ref Range   WBC 8.3 4.0 - 10.5 K/uL   RBC 4.06 3.87 - 5.11 MIL/uL   Hemoglobin 9.8 (L) 12.0 - 15.0 g/dL   HCT 28.3 (L) 66.2 - 94.7 %   MCV 80.3 80.0 - 100.0 fL   MCH 24.1 (L) 26.0 - 34.0 pg   MCHC 30.1 30.0 - 36.0 g/dL   RDW 65.4 (H) 65.0 - 35.4 %   Platelets 273 150 - 400 K/uL   nRBC 0.0 0.0 - 0.2 %    Comment: Performed at [facility], [address], [place], [place] [address]    Studies/Results: CT ABDOMEN PELVIS W CONTRAST  Result Date: 05/13/2020 CLINICAL DATA:  Recent cholecystectomy with pain EXAM: CT ABDOMEN AND PELVIS WITH CONTRAST TECHNIQUE: Multidetector CT imaging of the abdomen and pelvis was performed using the standard protocol following bolus administration of intravenous contrast. CONTRAST:  OMNIPAQUE IOHEXOL 300 MG/ML  SOLN COMPARISON:  May 11, 2020 FINDINGS: Lower chest: There is bibasilar atelectasis. Lung bases otherwise are clear. Hepatobiliary: No focal liver lesions are appreciable. Gallbladder is absent. There is slightly more fluid in the gallbladder fossa region compared to 2 days prior  with fluid collection in the gallbladder fossa region measuring 5.9 x 2.5 cm. This fluid collection is better defined than on study from 2 days prior. There is fat stranding in the medial gallbladder fossa region consistent with recent surgery, similar to 2 days prior. Trace air along the periphery of this fluid collection may be of postoperative etiology. A well-defined air collection within the fluid in the gallbladder fossa is not appreciable. Soft tissue stranding is noted along the lateral right abdomen consistent with recent surgery, essentially stable. There is no appreciable biliary duct dilatation. Pancreas: No pancreatic mass or inflammatory focus. Spleen: No splenic lesions are evident. Adrenals/Urinary Tract: Adrenals bilaterally appear normal. There is no evident mass or hydronephrosis on either side. There is a 2 mm calculus with an adjacent 3 mm calculus in the mid right kidney, unchanged. There is a 2 mm calculus in the mid left kidney, unchanged. No ureteral calculus evident on either side. Urinary bladder is midline with wall thickness within normal limits. Stomach/Bowel: There is no bowel wall or mesenteric thickening. No evident bowel obstruction. Terminal ileum appears normal. Appendix appears normal. There is no evident free air or portal venous air. Small amount of pneumoperitoneum seen 2 days prior no longer evident. Vascular/Lymphatic: No abdominal aortic aneurysm. No arterial vascular lesions are evident. Major venous structures appear patent. There is a circumaortic left renal vein, an anatomic variant. No evident adenopathy in the abdomen or pelvis. Reproductive: Uterus is anteverted. No adnexal masses evident. There is a small amount of fluid in the cul-de-sac, stable. Other: No well-defined abscess in the abdomen or pelvis. Minimal fluid tracking along the right lateral conal fascia is likely secondary to recent surgery. No ascites beyond the mild fluid in the cul-de-sac.  Musculoskeletal: No blastic or lytic bone lesions. No intramuscular lesions. Evidence of recent surgery at the umbilicus level. IMPRESSION: 1. There has been an increase in  a well-defined fluid collection in the gallbladder fossa compared to 2 days prior with fluid in this area currently measuring 5.9 x 2.5 cm. No appreciable air within this collection noted. Early developing infectious lesion in this area must be of concern given the increase in fluid in this area. This fluid collection is somewhat better defined than 2 days prior. Close clinical and imaging surveillance in this regard advised. 2. Postoperative soft tissue stranding in the right abdomen with slight fluid tracking into the right lateral conal fascia. No evidence suggesting abscess in these areas. 3. No bowel wall thickening or bowel obstruction. Appendix appears normal. 4. Nonobstructing calculi in each kidney. No hydronephrosis or ureteral calculus. Urinary bladder wall thickness normal. Electronically Signed   By: Bretta Bang III M.D.   On: 05/13/2020 07:55   US Abdomen Limited  Result Date: 05/13/2020 CLINICAL DATA:  Pain post cholecystectomy EXAM: ULTRASOUND ABDOMEN LIMITED RIGHT UPPER QUADRANT COMPARISON:  05/11/2020, ultrasound 05/09/2020 FINDINGS: Gallbladder: Post cholecystectomy. Question some fluid towards the gallbladder fossa the exam was terminated prematurely due to patient discomfort. Common bile duct: Nonvisualized Liver: Incomplete assessment of the liver with only partial visualization of the left lobe. Other: Exam was terminated prematurely by the patient due to inability to tolerate scanning procedure with exquisite pain and tenderness upon transducer pressure. IMPRESSION: Premature termination of the exam due to patient discomfort and inability to tolerate scanning procedure. Post cholecystectomy. Question some fluid towards the gallbladder fossa. Minimal visualization of the left lobe liver. Nonvisualization of the  common bile duct. Electronically Signed   By: Kreg Shropshire M.D.   On: 05/13/2020 06:55   DG CHEST PORT 1 VIEW  Result Date: 05/13/2020 CLINICAL DATA:  One month postpartum with recent laparoscopic cholecystectomy. Right upper quadrant abdominal pain. EXAM: PORTABLE CHEST 1 VIEW COMPARISON:  None. FINDINGS: The heart size and mediastinal contours are within normal limits. Low lung volumes. No focal airspace disease. The visualized skeletal structures are unremarkable. IMPRESSION: No evidence of acute cardiopulmonary disease. Electronically Signed   By: Caprice Renshaw   On: 05/13/2020 09:40   NM HEPATOBILIARY LEAK (POST-SURGICAL)  Result Date: 05/13/2020 CLINICAL DATA:  Status post cholecystectomy with right upper quadrant pain. EXAM: NUCLEAR MEDICINE HEPATOBILIARY IMAGING TECHNIQUE: Sequential images of the abdomen were obtained out to 60 minutes following intravenous administration of radiopharmaceutical. RADIOPHARMACEUTICALS:  4.9 mCi Tc-58m  Choletec IV COMPARISON:  CT 05/13/20 FINDINGS: Prompt uptake and biliary excretion of activity by the liver is seen. Biliary activity passes into small bowel, consistent with patent common bile duct. At the end of the first hour a small focus mild increased radiotracer accumulation is noted within the gallbladder fossa. During the second hour there is persistent and mildly progressive radiotracer accumulation within the gallbladder fossa. IMPRESSION: 1. Small focus of mild progressive radiotracer accumulation within the gallbladder fossa. Primary differential consideration includes: small contained leak versus radiotracer accumulation within gallbladder remnant. 2. Common bile duct appears patent with most of the radiotracer accumulating within the small bowel loops. 3. These results were shared via EPIC CHAT at the time of interpretation on 05/13/2020 at 12:53 pm to provider [facility] , who acknowledged these results. Electronically Signed   By: Signa Kell M.D.   On:  05/13/2020 12:53    Medications: I have reviewed the patient's current medications.  Assessment: Bile leak postcholecystectomy Worsening right upper quadrant abdominal pain, clinical picture compatible with biliary peritonitis  Plan: IR has been consulted for possible drain placement. Patient currently scheduled for  ERCP with sphincterotomy and stent placement tomorrow at 7:30 AM. She is to receive Dilaudid 1 mg IV every 3 hours, oxycodone 5 to 10 mg every 4 hours as needed.  Kerin SalenArya Abdirizak Richison, MD 05/14/2020, 12:17 PM

## 2020-05-14 NOTE — H&P (View-Only) (Signed)
Subjective: Patient complains of severe right upper quadrant and generalized abdominal pain, 20 out of 10 in intensity. She has received Dilaudid, morphine and oxycodone, and states the pain is still 10 out of 10. IR consult has been placed for possible catheter placement and drainage of bile leak.  Objective: Vital signs in last 24 hours: Temp:  [98 F (36.7 C)-98.6 F (37 C)] 98.6 F (37 C) (05/07 1102) Pulse Rate:  [51-73] 73 (05/07 1102) Resp:  [16-18] 18 (05/07 1102) BP: (98-150)/(51-106) 150/106 (05/07 1102) SpO2:  [96 %-100 %] 96 % (05/07 1102) Weight change:     PE: In obvious distress GENERAL: Able to speak in full words, no icterus, mild pallor ABDOMEN: Generalized tenderness, mild rebound tenderness, bowel sounds sluggish EXTREMITIES: No edema  Lab Results: Results for orders placed or performed during the hospital encounter of 05/13/20 (from the past 48 hour(s))  CBC with Differential     Status: Abnormal   Collection Time: 05/13/20  5:49 AM  Result Value Ref Range   WBC 11.6 (H) 4.0 - 10.5 K/uL   RBC 4.52 3.87 - 5.11 MIL/uL   Hemoglobin 11.0 (L) 12.0 - 15.0 g/dL   HCT 16.1 (L) 09.6 - 04.5 %   MCV 78.3 (L) 80.0 - 100.0 fL   MCH 24.3 (L) 26.0 - 34.0 pg   MCHC 31.1 30.0 - 36.0 g/dL   RDW 40.9 (H) 81.1 - 91.4 %   Platelets 339 150 - 400 K/uL   nRBC 0.0 0.0 - 0.2 %   Neutrophils Relative % 62 %   Neutro Abs 7.2 1.7 - 7.7 K/uL   Lymphocytes Relative 30 %   Lymphs Abs 3.5 0.7 - 4.0 K/uL   Monocytes Relative 7 %   Monocytes Absolute 0.8 0.1 - 1.0 K/uL   Eosinophils Relative 1 %   Eosinophils Absolute 0.1 0.0 - 0.5 K/uL   Basophils Relative 0 %   Basophils Absolute 0.0 0.0 - 0.1 K/uL   Immature Granulocytes 0 %   Abs Immature Granulocytes 0.04 0.00 - 0.07 K/uL    Comment: Performed at [facility], [address], [place], [place] [address]  Comprehensive metabolic panel     Status: Abnormal   Collection Time: 05/13/20  5:49 AM  Result Value  Ref Range   Sodium 136 135 - 145 mmol/L   Potassium 3.7 3.5 - 5.1 mmol/L   Chloride 103 98 - 111 mmol/L   CO2 24 22 - 32 mmol/L   Glucose, Bld 116 (H) 70 - 99 mg/dL    Comment: Glucose reference range applies only to samples taken after fasting for at least 8 hours.   BUN 9 6 - 20 mg/dL   Creatinine, Ser 6.21 0.44 - 1.00 mg/dL   Calcium 9.0 8.9 - 30.8 mg/dL   Total Protein 6.9 6.5 - 8.1 g/dL   Albumin 3.5 3.5 - 5.0 g/dL   AST 16 15 - 41 U/L   ALT 21 0 - 44 U/L   Alkaline Phosphatase 91 38 - 126 U/L   Total Bilirubin 1.0 0.3 - 1.2 mg/dL   GFR, Estimated >65 >78 mL/min    Comment: (NOTE) Calculated using the CKD-EPI Creatinine Equation (2021)    Anion gap 9 5 - 15    Comment: Performed at [facility], [address], [place], [place] [address]  Lipase, blood     Status: None   Collection Time: 05/13/20  5:49 AM  Result Value Ref Range  Lipase 27 11 - 51 U/L    Comment: Performed at [facility], [address], [place], [place] [address]  Resp Panel by RT-PCR (Flu A&B, Covid) Nasopharyngeal Swab     Status: None   Collection Time: 05/13/20  9:28 AM   Specimen: Nasopharyngeal Swab; Nasopharyngeal(NP) swabs in vial transport medium  Result Value Ref Range   SARS Coronavirus 2 by RT PCR NEGATIVE NEGATIVE    Comment: (NOTE) SARS-CoV-2 target nucleic acids are NOT DETECTED.  The SARS-CoV-2 RNA is generally detectable in upper respiratory specimens during the acute phase of infection. The lowest concentration of SARS-CoV-2 viral copies this assay can detect is 138 copies/mL. A negative result does not preclude SARS-Cov-2 infection and should not be used as the sole basis for treatment or other patient management decisions. A negative result may occur with  improper specimen collection/handling, submission of specimen other than nasopharyngeal swab, presence of viral mutation(s) within the areas targeted by this assay, and inadequate number of  viral copies(<138 copies/mL). A negative result must be combined with clinical observations, patient history, and epidemiological information. The expected result is Negative.  Fact Sheet for Patients:  BloggerCourse.com  Fact Sheet for Healthcare Providers:  SeriousBroker.it  This test is no t yet approved or cleared by the [place] FDA and  has been authorized for detection and/or diagnosis of SARS-CoV-2 by FDA under an Emergency Use Authorization (EUA). This EUA will remain  in effect (meaning this test can be used) for the duration of the COVID-19 declaration under Section 564(b)(1) of the Act, 21 U.S.C.section 360bbb-3(b)(1), unless the authorization is terminated  or revoked sooner.       Influenza A by PCR NEGATIVE NEGATIVE   Influenza B by PCR NEGATIVE NEGATIVE    Comment: (NOTE) The Xpert Xpress SARS-CoV-2/FLU/RSV plus assay is intended as an aid in the diagnosis of influenza from Nasopharyngeal swab specimens and should not be used as a sole basis for treatment. Nasal washings and aspirates are unacceptable for Xpert Xpress SARS-CoV-2/FLU/RSV testing.  Fact Sheet for Patients: BloggerCourse.com  Fact Sheet for Healthcare Providers: SeriousBroker.it  This test is not yet approved or cleared by the [place] FDA and has been authorized for detection and/or diagnosis of SARS-CoV-2 by FDA under an Emergency Use Authorization (EUA). This EUA will remain in effect (meaning this test can be used) for the duration of the COVID-19 declaration under Section 564(b)(1) of the Act, 21 U.S.C. section 360bbb-3(b)(1), unless the authorization is terminated or revoked.  Performed at [facility], [address] [place], [place] [address]   Comprehensive metabolic panel     Status: Abnormal   Collection Time: 05/14/20  5:30 AM  Result Value Ref Range    Sodium 139 135 - 145 mmol/L   Potassium 4.1 3.5 - 5.1 mmol/L   Chloride 106 98 - 111 mmol/L   CO2 26 22 - 32 mmol/L   Glucose, Bld 101 (H) 70 - 99 mg/dL    Comment: Glucose reference range applies only to samples taken after fasting for at least 8 hours.   BUN 6 6 - 20 mg/dL   Creatinine, Ser 1.44 0.44 - 1.00 mg/dL   Calcium 8.5 (L) 8.9 - 10.3 mg/dL   Total Protein 6.1 (L) 6.5 - 8.1 g/dL   Albumin 3.1 (L) 3.5 - 5.0 g/dL   AST 15 15 - 41 U/L   ALT 19 0 - 44 U/L   Alkaline Phosphatase 81 38 - 126 U/L  Total Bilirubin 0.8 0.3 - 1.2 mg/dL   GFR, Estimated >72 >09 mL/min    Comment: (NOTE) Calculated using the CKD-EPI Creatinine Equation (2021)    Anion gap 7 5 - 15    Comment: Performed at [facility], [address] [place] [address] [place], [place] [address]  CBC     Status: Abnormal   Collection Time: 05/14/20  5:30 AM  Result Value Ref Range   WBC 8.3 4.0 - 10.5 K/uL   RBC 4.06 3.87 - 5.11 MIL/uL   Hemoglobin 9.8 (L) 12.0 - 15.0 g/dL   HCT 28.3 (L) 66.2 - 94.7 %   MCV 80.3 80.0 - 100.0 fL   MCH 24.1 (L) 26.0 - 34.0 pg   MCHC 30.1 30.0 - 36.0 g/dL   RDW 65.4 (H) 65.0 - 35.4 %   Platelets 273 150 - 400 K/uL   nRBC 0.0 0.0 - 0.2 %    Comment: Performed at [facility], [address], [place], [place] [address]    Studies/Results: CT ABDOMEN PELVIS W CONTRAST  Result Date: 05/13/2020 CLINICAL DATA:  Recent cholecystectomy with pain EXAM: CT ABDOMEN AND PELVIS WITH CONTRAST TECHNIQUE: Multidetector CT imaging of the abdomen and pelvis was performed using the standard protocol following bolus administration of intravenous contrast. CONTRAST:  OMNIPAQUE IOHEXOL 300 MG/ML  SOLN COMPARISON:  May 11, 2020 FINDINGS: Lower chest: There is bibasilar atelectasis. Lung bases otherwise are clear. Hepatobiliary: No focal liver lesions are appreciable. Gallbladder is absent. There is slightly more fluid in the gallbladder fossa region compared to 2 days prior  with fluid collection in the gallbladder fossa region measuring 5.9 x 2.5 cm. This fluid collection is better defined than on study from 2 days prior. There is fat stranding in the medial gallbladder fossa region consistent with recent surgery, similar to 2 days prior. Trace air along the periphery of this fluid collection may be of postoperative etiology. A well-defined air collection within the fluid in the gallbladder fossa is not appreciable. Soft tissue stranding is noted along the lateral right abdomen consistent with recent surgery, essentially stable. There is no appreciable biliary duct dilatation. Pancreas: No pancreatic mass or inflammatory focus. Spleen: No splenic lesions are evident. Adrenals/Urinary Tract: Adrenals bilaterally appear normal. There is no evident mass or hydronephrosis on either side. There is a 2 mm calculus with an adjacent 3 mm calculus in the mid right kidney, unchanged. There is a 2 mm calculus in the mid left kidney, unchanged. No ureteral calculus evident on either side. Urinary bladder is midline with wall thickness within normal limits. Stomach/Bowel: There is no bowel wall or mesenteric thickening. No evident bowel obstruction. Terminal ileum appears normal. Appendix appears normal. There is no evident free air or portal venous air. Small amount of pneumoperitoneum seen 2 days prior no longer evident. Vascular/Lymphatic: No abdominal aortic aneurysm. No arterial vascular lesions are evident. Major venous structures appear patent. There is a circumaortic left renal vein, an anatomic variant. No evident adenopathy in the abdomen or pelvis. Reproductive: Uterus is anteverted. No adnexal masses evident. There is a small amount of fluid in the cul-de-sac, stable. Other: No well-defined abscess in the abdomen or pelvis. Minimal fluid tracking along the right lateral conal fascia is likely secondary to recent surgery. No ascites beyond the mild fluid in the cul-de-sac.  Musculoskeletal: No blastic or lytic bone lesions. No intramuscular lesions. Evidence of recent surgery at the umbilicus level. IMPRESSION: 1. There has been an increase in  a well-defined fluid collection in the gallbladder fossa compared to 2 days prior with fluid in this area currently measuring 5.9 x 2.5 cm. No appreciable air within this collection noted. Early developing infectious lesion in this area must be of concern given the increase in fluid in this area. This fluid collection is somewhat better defined than 2 days prior. Close clinical and imaging surveillance in this regard advised. 2. Postoperative soft tissue stranding in the right abdomen with slight fluid tracking into the right lateral conal fascia. No evidence suggesting abscess in these areas. 3. No bowel wall thickening or bowel obstruction. Appendix appears normal. 4. Nonobstructing calculi in each kidney. No hydronephrosis or ureteral calculus. Urinary bladder wall thickness normal. Electronically Signed   By: Bretta Bang III M.D.   On: 05/13/2020 07:55   US Abdomen Limited  Result Date: 05/13/2020 CLINICAL DATA:  Pain post cholecystectomy EXAM: ULTRASOUND ABDOMEN LIMITED RIGHT UPPER QUADRANT COMPARISON:  05/11/2020, ultrasound 05/09/2020 FINDINGS: Gallbladder: Post cholecystectomy. Question some fluid towards the gallbladder fossa the exam was terminated prematurely due to patient discomfort. Common bile duct: Nonvisualized Liver: Incomplete assessment of the liver with only partial visualization of the left lobe. Other: Exam was terminated prematurely by the patient due to inability to tolerate scanning procedure with exquisite pain and tenderness upon transducer pressure. IMPRESSION: Premature termination of the exam due to patient discomfort and inability to tolerate scanning procedure. Post cholecystectomy. Question some fluid towards the gallbladder fossa. Minimal visualization of the left lobe liver. Nonvisualization of the  common bile duct. Electronically Signed   By: Kreg Shropshire M.D.   On: 05/13/2020 06:55   DG CHEST PORT 1 VIEW  Result Date: 05/13/2020 CLINICAL DATA:  One month postpartum with recent laparoscopic cholecystectomy. Right upper quadrant abdominal pain. EXAM: PORTABLE CHEST 1 VIEW COMPARISON:  None. FINDINGS: The heart size and mediastinal contours are within normal limits. Low lung volumes. No focal airspace disease. The visualized skeletal structures are unremarkable. IMPRESSION: No evidence of acute cardiopulmonary disease. Electronically Signed   By: Caprice Renshaw   On: 05/13/2020 09:40   NM HEPATOBILIARY LEAK (POST-SURGICAL)  Result Date: 05/13/2020 CLINICAL DATA:  Status post cholecystectomy with right upper quadrant pain. EXAM: NUCLEAR MEDICINE HEPATOBILIARY IMAGING TECHNIQUE: Sequential images of the abdomen were obtained out to 60 minutes following intravenous administration of radiopharmaceutical. RADIOPHARMACEUTICALS:  4.9 mCi Tc-58m  Choletec IV COMPARISON:  CT 05/13/20 FINDINGS: Prompt uptake and biliary excretion of activity by the liver is seen. Biliary activity passes into small bowel, consistent with patent common bile duct. At the end of the first hour a small focus mild increased radiotracer accumulation is noted within the gallbladder fossa. During the second hour there is persistent and mildly progressive radiotracer accumulation within the gallbladder fossa. IMPRESSION: 1. Small focus of mild progressive radiotracer accumulation within the gallbladder fossa. Primary differential consideration includes: small contained leak versus radiotracer accumulation within gallbladder remnant. 2. Common bile duct appears patent with most of the radiotracer accumulating within the small bowel loops. 3. These results were shared via EPIC CHAT at the time of interpretation on 05/13/2020 at 12:53 pm to provider [facility] , who acknowledged these results. Electronically Signed   By: Signa Kell M.D.   On:  05/13/2020 12:53    Medications: I have reviewed the patient's current medications.  Assessment: Bile leak postcholecystectomy Worsening right upper quadrant abdominal pain, clinical picture compatible with biliary peritonitis  Plan: IR has been consulted for possible drain placement. Patient currently scheduled for  ERCP with sphincterotomy and stent placement tomorrow at 7:30 AM. She is to receive Dilaudid 1 mg IV every 3 hours, oxycodone 5 to 10 mg every 4 hours as needed.  Kerin SalenArya Razan Siler, MD 05/14/2020, 12:17 PM

## 2020-05-14 NOTE — Progress Notes (Signed)
Acute worsening of pain this morning.  Likely from ongoing leakage of bile.  Discussed case with Dr. Archer Asa with interventional radiology who will evaluate the patient for drain placement.

## 2020-05-14 NOTE — Progress Notes (Signed)
Called MD this morning, pt had sudden increase in pain, in tears, said it was excruciating. Previously pt had been up walking. Orders given for new pain medication.

## 2020-05-15 ENCOUNTER — Inpatient Hospital Stay (HOSPITAL_COMMUNITY): Payer: Medicaid Other | Admitting: Certified Registered"

## 2020-05-15 ENCOUNTER — Encounter (HOSPITAL_COMMUNITY): Admission: EM | Disposition: A | Discharge status: Home / Self Care | Payer: Self-pay | Source: Home / Self Care

## 2020-05-15 ENCOUNTER — Encounter (HOSPITAL_COMMUNITY): Payer: Self-pay

## 2020-05-15 ENCOUNTER — Inpatient Hospital Stay (HOSPITAL_COMMUNITY): Payer: Medicaid Other

## 2020-05-15 HISTORY — PX: PANCREATIC STENT PLACEMENT: SHX5539

## 2020-05-15 HISTORY — PX: ENDOSCOPIC RETROGRADE CHOLANGIOPANCREATOGRAPHY (ERCP) WITH PROPOFOL: SHX5810

## 2020-05-15 HISTORY — PX: BILIARY STENT PLACEMENT: SHX5538

## 2020-05-15 HISTORY — PX: SPHINCTEROTOMY: SHX5544

## 2020-05-15 SURGERY — ENDOSCOPIC RETROGRADE CHOLANGIOPANCREATOGRAPHY (ERCP) WITH PROPOFOL
Anesthesia: General

## 2020-05-15 MED ORDER — FENTANYL CITRATE (PF) 100 MCG/2ML IJ SOLN
INTRAMUSCULAR | Status: DC | PRN
  Administered 2020-05-15: 100 ug via INTRAVENOUS
  Administered 2020-05-15 (×2): 50 ug via INTRAVENOUS

## 2020-05-15 MED ORDER — ONDANSETRON HCL 4 MG PO TABS
4.0000 mg | ORAL_TABLET | Freq: Four times a day (QID) | ORAL | Status: DC
  Administered 2020-05-15 – 2020-05-16 (×2): 4 mg via ORAL
  Filled 2020-05-15 (×3): qty 1

## 2020-05-15 MED ORDER — PHENYLEPHRINE 40 MCG/ML (10ML) SYRINGE FOR IV PUSH (FOR BLOOD PRESSURE SUPPORT)
PREFILLED_SYRINGE | INTRAVENOUS | Status: DC | PRN
  Administered 2020-05-15 (×2): 80 ug via INTRAVENOUS

## 2020-05-15 MED ORDER — PRENATAL MULTIVITAMIN CH
1.0000 | ORAL_TABLET | Freq: Every day | ORAL | Status: DC
  Administered 2020-05-15 – 2020-05-17 (×3): 1 via ORAL
  Filled 2020-05-15 (×3): qty 1

## 2020-05-15 MED ORDER — FENTANYL CITRATE (PF) 100 MCG/2ML IJ SOLN
INTRAMUSCULAR | Status: AC
  Filled 2020-05-15: qty 2

## 2020-05-15 MED ORDER — DEXAMETHASONE SODIUM PHOSPHATE 10 MG/ML IJ SOLN
INTRAMUSCULAR | Status: DC | PRN
  Administered 2020-05-15: 10 mg via INTRAVENOUS

## 2020-05-15 MED ORDER — SCOPOLAMINE 1 MG/3DAYS TD PT72
MEDICATED_PATCH | TRANSDERMAL | Status: AC
  Filled 2020-05-15: qty 1

## 2020-05-15 MED ORDER — ONDANSETRON HCL 4 MG/2ML IJ SOLN
INTRAMUSCULAR | Status: DC | PRN
  Administered 2020-05-15: 4 mg via INTRAVENOUS

## 2020-05-15 MED ORDER — LACTATED RINGERS IV SOLN: INTRAVENOUS | Status: DC

## 2020-05-15 MED ORDER — SUGAMMADEX SODIUM 500 MG/5ML IV SOLN
INTRAVENOUS | Status: DC | PRN
  Administered 2020-05-15: 300 mg via INTRAVENOUS

## 2020-05-15 MED ORDER — SODIUM CHLORIDE 0.9 % IV SOLN
INTRAVENOUS | Status: DC | PRN
  Administered 2020-05-15: 10 mL

## 2020-05-15 MED ORDER — SODIUM CHLORIDE 0.9 % IV SOLN: INTRAVENOUS | Status: DC

## 2020-05-15 MED ORDER — MIDAZOLAM HCL 2 MG/2ML IJ SOLN
INTRAMUSCULAR | Status: DC | PRN
  Administered 2020-05-15: 1 mg via INTRAVENOUS

## 2020-05-15 MED ORDER — GLUCAGON HCL RDNA (DIAGNOSTIC) 1 MG IJ SOLR
INTRAMUSCULAR | Status: AC
  Filled 2020-05-15: qty 1

## 2020-05-15 MED ORDER — OXYCODONE HCL 5 MG PO TABS
5.0000 mg | ORAL_TABLET | Freq: Four times a day (QID) | ORAL | Status: DC | PRN
  Administered 2020-05-15 – 2020-05-16 (×3): 5 mg via ORAL
  Filled 2020-05-15 (×3): qty 1

## 2020-05-15 MED ORDER — TRAMADOL HCL 50 MG PO TABS
50.0000 mg | ORAL_TABLET | Freq: Four times a day (QID) | ORAL | Status: DC | PRN
  Administered 2020-05-16 – 2020-05-17 (×3): 50 mg via ORAL
  Filled 2020-05-15 (×3): qty 1

## 2020-05-15 MED ORDER — EPHEDRINE SULFATE-NACL 50-0.9 MG/10ML-% IV SOSY
PREFILLED_SYRINGE | INTRAVENOUS | Status: DC | PRN
  Administered 2020-05-15: 10 mg via INTRAVENOUS
  Administered 2020-05-15: 15 mg via INTRAVENOUS
  Administered 2020-05-15 (×2): 10 mg via INTRAVENOUS

## 2020-05-15 MED ORDER — MIDAZOLAM HCL 2 MG/2ML IJ SOLN
INTRAMUSCULAR | Status: AC
  Filled 2020-05-15: qty 2

## 2020-05-15 MED ORDER — CIPROFLOXACIN IN D5W 400 MG/200ML IV SOLN
INTRAVENOUS | Status: DC | PRN
  Administered 2020-05-15: 400 mg via INTRAVENOUS

## 2020-05-15 MED ORDER — SCOPOLAMINE 1 MG/3DAYS TD PT72
MEDICATED_PATCH | TRANSDERMAL | Status: DC | PRN
  Administered 2020-05-15: 1 via TRANSDERMAL

## 2020-05-15 MED ORDER — POLYETHYLENE GLYCOL 3350 17 G PO PACK: 17.0000 g | PACK | Freq: Every day | ORAL | Status: DC | PRN

## 2020-05-15 MED ORDER — INDOMETHACIN 50 MG RE SUPP
RECTAL | Status: AC
  Filled 2020-05-15: qty 2

## 2020-05-15 MED ORDER — LIDOCAINE 2% (20 MG/ML) 5 ML SYRINGE
INTRAMUSCULAR | Status: DC | PRN
  Administered 2020-05-15: 20 mg via INTRAVENOUS

## 2020-05-15 MED ORDER — INDOMETHACIN 50 MG RE SUPP
RECTAL | Status: DC | PRN
  Administered 2020-05-15: 100 mg via RECTAL

## 2020-05-15 MED ORDER — CIPROFLOXACIN IN D5W 400 MG/200ML IV SOLN
INTRAVENOUS | Status: AC
  Filled 2020-05-15: qty 200

## 2020-05-15 MED ORDER — PROPOFOL 10 MG/ML IV BOLUS
INTRAVENOUS | Status: DC | PRN
  Administered 2020-05-15: 120 mg via INTRAVENOUS

## 2020-05-15 MED ORDER — ROCURONIUM BROMIDE 10 MG/ML (PF) SYRINGE
PREFILLED_SYRINGE | INTRAVENOUS | Status: DC | PRN
  Administered 2020-05-15: 60 mg via INTRAVENOUS

## 2020-05-15 NOTE — Brief Op Note (Signed)
05/13/2020 - 05/15/2020  9:35 AM  PATIENT:  Jordan Chung  30 y.o. female  PRE-OPERATIVE DIAGNOSIS:  bile leak  POST-OPERATIVE DIAGNOSIS:  * No post-op diagnosis entered *  PROCEDURE:  Procedure(s): ENDOSCOPIC RETROGRADE CHOLANGIOPANCREATOGRAPHY (ERCP) WITH PROPOFOL (N/A)  SURGEON:  Surgeon(s) and Role:    Ronnette Juniper, MD - Primary  PHYSICIAN ASSISTANT:   ASSISTANTS: Grace Isaac, RN, Laverda Sorenson, Tech  ANESTHESIA:   MAC  EBL:  None  BLOOD ADMINISTERED:none  DRAINS: none   LOCAL MEDICATIONS USED:  NONE  SPECIMEN:  No Specimen  DISPOSITION OF SPECIMEN:  N/A  COUNTS:  YES  TOURNIQUET:  * No tourniquets in log *  DICTATION: .Dragon Dictation  PLAN OF CARE: Admit to inpatient   PATIENT DISPOSITION:  PACU - hemodynamically stable.   Delay start of Pharmacological VTE agent (>24hrs) due to surgical blood loss or risk of bleeding: not applicable

## 2020-05-15 NOTE — Anesthesia Postprocedure Evaluation (Signed)
Anesthesia Post Note  Patient: Jordan Chung  Procedure(s) Performed: ENDOSCOPIC RETROGRADE CHOLANGIOPANCREATOGRAPHY (ERCP) WITH PROPOFOL (N/A ) SPHINCTEROTOMY PANCREATIC STENT PLACEMENT     Patient location during evaluation: Endoscopy Anesthesia Type: General Level of consciousness: awake and alert, patient cooperative and oriented Pain management: satisfactory to patient (no pain) Vital Signs Assessment: post-procedure vital signs reviewed and stable Respiratory status: nonlabored ventilation, spontaneous breathing and respiratory function stable Cardiovascular status: blood pressure returned to baseline and stable Postop Assessment: no apparent nausea or vomiting Anesthetic complications: no   No complications documented.  Last Vitals:  Vitals:   05/15/20 0704 05/15/20 0939  BP: 114/67 (!) 125/51  Pulse: (!) 54 81  Resp: 13 10  Temp: (!) 36.1 C 37.3 C  SpO2: 98% 94%    Last Pain:  Vitals:   05/15/20 0939  TempSrc: Oral  PainSc: 0-No pain                 Arella Blinder,E. Adaia Matthies

## 2020-05-15 NOTE — Anesthesia Procedure Notes (Signed)
Procedure Name: Intubation Date/Time: 05/15/2020 8:07 AM Performed by: Minerva Ends, CRNA Pre-anesthesia Checklist: Patient identified, Emergency Drugs available, Suction available and Patient being monitored Patient Re-evaluated:Patient Re-evaluated prior to induction Oxygen Delivery Method: Circle System Utilized Preoxygenation: Pre-oxygenation with 100% oxygen Induction Type: IV induction Ventilation: Mask ventilation without difficulty Laryngoscope Size: Miller and 2 Grade View: Grade I Tube type: Oral Number of attempts: 1 Airway Equipment and Method: Stylet Placement Confirmation: ETT inserted through vocal cords under direct vision,  positive ETCO2 and breath sounds checked- equal and bilateral Secured at: 21 cm Tube secured with: Tape Dental Injury: Teeth and Oropharynx as per pre-operative assessment  Comments: Smooth IV induction Jean Rosenthal-- intubation AM CRNA atraumatic teeth and mouth as preop bilat BS Jean Rosenthal

## 2020-05-15 NOTE — Op Note (Signed)
[facility] Patient Name: Jordan Chung Procedure Date: 05/15/2020 MRN: [id number] Attending MD: Kerin Salen , MD Date of Birth: December 30, 1990 CSN: [id number] Age: 30 Admit Type: Inpatient Procedure:                ERCP Indications:              Suspected slow bile leak on HIDA scan, RUQ fluid                            collection post cholecystectomy, s/p IR guided                            drain placement Providers:                Kerin Salen, MD, Vicki Mallet, RN, Rozetta Nunnery,                            Technician Referring MD:             CCS Medicines:                Monitored Anesthesia Care Complications:            No immediate complications. Estimated Blood Loss:     Estimated blood loss: none. Procedure:                Pre-Anesthesia Assessment:                           - Prior to the procedure, a History and Physical                            was performed, and patient medications and                            allergies were reviewed. The patient's tolerance of                            previous anesthesia was also reviewed. The risks                            and benefits of the procedure and the sedation                            options and risks were discussed with the patient.                            All questions were answered, and informed consent                            was obtained. Prior Anticoagulants: The patient has                            taken no previous anticoagulant or antiplatelet                            agents. ASA Grade Assessment:  II - A patient with                            mild systemic disease. After reviewing the risks                            and benefits, the patient was deemed in                            satisfactory condition to undergo the procedure.                           After obtaining informed consent, the scope was                            passed under direct vision. Throughout the                             procedure, the patient's blood pressure, pulse, and                            oxygen saturations were monitored continuously. The                            [id number]-Q180V ([id number]) Olympus Duodenoscope was                            introduced through the mouth, and used to inject                            contrast into and used to cannulate the bile duct.                            The ERCP was technically difficult and complex due                            to challenging cannulation. The patient tolerated                            the procedure well. Scope In: Scope Out: Findings:      A scout film of the abdomen was obtained. Surgical clips, consistent       with a previous cholecystectomy, were seen in the area of the right       upper quadrant of the abdomen.      The esophagus was successfully intubated under direct vision. The scope       was advanced to a normal major papilla in the descending duodenum       without detailed examination of the pharynx, larynx and associated       structures, and upper GI tract. The upper GI tract was grossly normal.       The stomach was J shaped and the abdominal pressure and slight change in       patient's position was needed to advance the duodenoscope to the       ampullary area.  Canulation of the bile duct was extremely challenging.      On first attempt the wire was inadvertently advanced in the direction of       pancreatic duct. After withdrawl of the wire, multiple attempts were       made to canulate the CBD which was not successful despite the changing       the standard sphinctertome to a tapered sphinctertome and using a stiff       0.025 wire.      When superficial canulation was successful, a precut sphinctertomy was       made.      Thereafter, the wire advance in the direction of pancreatic duct.      Subsequently, one 4 Fr by 3 cm plastic stent with a single external       pigtail and no internal flaps was  placed 2.5 cm into the ventral       pancreatic duct. Clear fluid flowed through the stent. The stent was in       good position.      The bile duct was subsequently deeply cannulated with the tapered       sphincterotome. Contrast was injected. I personally interpreted the bile       duct images. There was brisk flow of contrast through the ducts. Image       quality was excellent. Contrast extended to the main bile duct. The main       bile duct was normal.      There was no obvious leak noted.      A straight Roadrunner wire was passed into the biliary tree. A 10 mm       biliary sphincterotomy was made with a braided tapered sphincterotome       using ERBE electrocautery. There was no post-sphincterotomy bleeding.       One 7 Fr by 7 cm plastic stent with a single external flap and no       internal flaps was placed 6.5 cm into the common bile duct. Bile flowed       through the stent. The stent was in good position. The proximal end of       the stent was beyond the cystic duct take off.      The patient had been given rectal indomethacin at the beginning of the       procedure and was given IV cipro X 1 during the procedure. Impression:               - A biliary sphincterotomy was performed.                           - One plastic stent was placed into the common bile                            duct.                           - One plastic stent was placed into the ventral                            pancreatic duct. Moderate Sedation:      Patient did not receive moderate sedation for this procedure, but       instead received monitored  anesthesia care. Recommendation:           - Advance diet as tolerated.                           - Perform an upper GI endoscopy as an outpatient in                            6-8 weeks for removal of CBD stent and pancreatic                            stent(if this has not fallen off spontaneously). Procedure Code(s):        --- Professional  ---                           [phone], Endoscopic retrograde                            cholangiopancreatography (ERCP); with placement of                            endoscopic stent into biliary or pancreatic duct,                            including pre- and post-dilation and guide wire                            passage, when performed, including sphincterotomy,                            when performed, each stent                           [id number], 59, Endoscopic retrograde                            cholangiopancreatography (ERCP); with placement of                            endoscopic stent into biliary or pancreatic duct,                            including pre- and post-dilation and guide wire                            passage, when performed, including sphincterotomy,                            when performed, each stent Diagnosis Code(s):        --- Professional ---                           K83.8, Other specified diseases of biliary tract CPT copyright 2019 [employer]. All rights reserved. The codes documented in this report are preliminary and upon coder review may  be revised to meet current compliance requirements. Kerin SalenArya Makenly [place], MD  05/15/2020 9:34:57 AM This report has been signed electronically. Number of Addenda: 0

## 2020-05-15 NOTE — Transfer of Care (Signed)
Immediate Anesthesia Transfer of Care Note  Patient: Jordan Chung  Procedure(s) Performed: ENDOSCOPIC RETROGRADE CHOLANGIOPANCREATOGRAPHY (ERCP) WITH PROPOFOL (N/A )  Patient Location: PACU and Endoscopy Unit  Anesthesia Type:General  Level of Consciousness: awake and alert   Airway & Oxygen Therapy: Patient Spontanous Breathing and Patient connected to face mask oxygen  Post-op Assessment: Report given to RN and Post -op Vital signs reviewed and stable  Post vital signs: Reviewed and stable  Last Vitals:  Vitals Value Taken Time  BP 125/51 05/15/20 0939  Temp    Pulse 81 05/15/20 0939  Resp 10 05/15/20 0939  SpO2 94 % 05/15/20 0939    Last Pain:  Vitals:   05/15/20 0939  TempSrc: Oral  PainSc: 0-No pain      Patients Stated Pain Goal: 2 (05/15/20 0345)  Complications: No complications documented.

## 2020-05-15 NOTE — Anesthesia Preprocedure Evaluation (Addendum)
Anesthesia Evaluation  Patient identified by MRN, date of birth, ID band Patient awake    Reviewed: Allergy & Precautions, NPO status , Patient's Chart, lab work & pertinent test results  History of Anesthesia Complications Negative for: history of anesthetic complications  Airway Mallampati: II  TM Distance: >3 FB Neck ROM: Full    Dental  (+) Dental Advisory Given   Pulmonary  05/13/2020 SARS coronavirus NEG   breath sounds clear to auscultation       Cardiovascular negative cardio ROS   Rhythm:Regular Rate:Normal     Neuro/Psych Depression negative neurological ROS     GI/Hepatic negative GI ROS, S/p cholecystectomy 05/09/2020: retained stone   Endo/Other  obese  Renal/GU negative Renal ROS     Musculoskeletal   Abdominal (+) + obese,   Peds  Hematology negative hematology ROS (+)   Anesthesia Other Findings   Reproductive/Obstetrics (+) Breast feeding                             Anesthesia Physical Anesthesia Plan  ASA: II  Anesthesia Plan: General   Post-op Pain Management:    Induction: Intravenous  PONV Risk Score and Plan: 3 and Ondansetron, Dexamethasone and Scopolamine patch - Pre-op  Airway Management Planned: Oral ETT  Additional Equipment: None  Intra-op Plan:   Post-operative Plan: Extubation in OR  Informed Consent: I have reviewed the patients History and Physical, chart, labs and discussed the procedure including the risks, benefits and alternatives for the proposed anesthesia with the patient or authorized representative who has indicated his/her understanding and acceptance.     Dental advisory given  Plan Discussed with: CRNA and Surgeon  Anesthesia Plan Comments:        Anesthesia Quick Evaluation

## 2020-05-15 NOTE — Progress Notes (Signed)
Referring Physician(s): Dr Warren Lacy Dr Burnard Bunting  Supervising Physician: Malachy Moan  Patient Status:  [facility] - In-pt  Chief Complaint:  Biloma Drain placed in IR 5/7  Subjective:   laparoscopic cholecystectomy on 05/09/20 for symptomatic cholelithiasis complicated post-operatively by enlarging, symptomatic biloma in the gallbladder fossa.    Up in bed after ERCP today  ENDOSCOPIC RETROGRADE CHOLANGIOPANCREATOGRAPHY (ERCP) WITH PROPOFOL (N/A) SPHINCTEROTOMY PANCREATIC STENT PLACEMENT BILIARY STENT PLACEMENT (N/A) Stable  Clear liquid diet  CONT PERCUTANEOUS DRAIN  Groggy but alert Family at bedside   Allergies: Patient has no known allergies.  Medications: Prior to Admission medications   Medication Sig Start Date End Date Taking? Authorizing Provider  docusate sodium (COLACE) 100 MG capsule Take 1 capsule (100 mg total) by mouth 2 (two) times daily. Patient taking differently: Take 100 mg by mouth daily as needed for mild constipation. 12/05/19 12/04/20 Yes Rasch, Victorino Dike I, NP  oxyCODONE (OXY IR/ROXICODONE) 5 MG immediate release tablet Take 5 mg by mouth every 6 (six) hours as needed for pain. 05/10/20  Yes [provider]  polyethylene glycol (MIRALAX / GLYCOLAX) 17 g packet Take 17 g by mouth daily as needed for mild constipation.   Yes [provider]  Prenatal Vit-Fe Fumarate-FA (MULTIVITAMIN-PRENATAL) 27-0.8 MG TABS tablet Take 1 tablet by mouth daily.   Yes [provider]  traMADol (ULTRAM) 50 MG tablet Take 1 tablet (50 mg total) by mouth every 6 (six) hours as needed for moderate pain or severe pain. 05/09/20  Yes Abigail Miyamoto, MD  metoCLOPramide (REGLAN) 10 MG tablet Take 1 tablet (10 mg total) by mouth 4 (four) times daily -  before meals and at bedtime. Patient not taking: No sig reported 12/05/19 12/04/20  Rasch, Victorino Dike I, NP  ondansetron (ZOFRAN) 4 MG tablet Take 1 tablet (4 mg total) by mouth every 6 (six) hours.  05/11/20   Curatolo, Adam, DO  dicyclomine (BENTYL) 20 MG tablet Take 1 tablet (20 mg total) by mouth 2 (two) times daily. 03/27/19 10/06/19  Lorelee New, PA-C     Vital Signs: BP 128/70 (BP Location: Left Arm)   Pulse 69   Temp (!) 97.4 F (36.3 C) (Oral)   Resp 16   Ht 5\' 8"  (1.727 m)   Wt 229 lb (103.9 kg)   LMP 07/23/2019 (Approximate) Comment: Gravida 2, Para 1  SpO2 96%   BMI 34.82 kg/m   Physical Exam Skin:    General: Skin is warm.     Comments: Site of drain is clean and dry NT no bleeding  OP bile: 80 cc yesterday 30 cc today      Imaging: CT ABDOMEN PELVIS W CONTRAST  Result Date: 05/13/2020 CLINICAL DATA:  Recent cholecystectomy with pain EXAM: CT ABDOMEN AND PELVIS WITH CONTRAST TECHNIQUE: Multidetector CT imaging of the abdomen and pelvis was performed using the standard protocol following bolus administration of intravenous contrast. CONTRAST:  07/13/2020 OMNIPAQUE IOHEXOL 300 MG/ML  SOLN COMPARISON:  May 11, 2020 FINDINGS: Lower chest: There is bibasilar atelectasis. Lung bases otherwise are clear. Hepatobiliary: No focal liver lesions are appreciable. Gallbladder is absent. There is slightly more fluid in the gallbladder fossa region compared to 2 days prior with fluid collection in the gallbladder fossa region measuring 5.9 x 2.5 cm. This fluid collection is better defined than on study from 2 days prior. There is fat stranding in the medial gallbladder fossa region consistent with recent surgery, similar to 2 days prior. Trace air along  the periphery of this fluid collection may be of postoperative etiology. A well-defined air collection within the fluid in the gallbladder fossa is not appreciable. Soft tissue stranding is noted along the lateral right abdomen consistent with recent surgery, essentially stable. There is no appreciable biliary duct dilatation. Pancreas: No pancreatic mass or inflammatory focus. Spleen: No splenic lesions are evident. Adrenals/Urinary  Tract: Adrenals bilaterally appear normal. There is no evident mass or hydronephrosis on either side. There is a 2 mm calculus with an adjacent 3 mm calculus in the mid right kidney, unchanged. There is a 2 mm calculus in the mid left kidney, unchanged. No ureteral calculus evident on either side. Urinary bladder is midline with wall thickness within normal limits. Stomach/Bowel: There is no bowel wall or mesenteric thickening. No evident bowel obstruction. Terminal ileum appears normal. Appendix appears normal. There is no evident free air or portal venous air. Small amount of pneumoperitoneum seen 2 days prior no longer evident. Vascular/Lymphatic: No abdominal aortic aneurysm. No arterial vascular lesions are evident. Major venous structures appear patent. There is a circumaortic left renal vein, an anatomic variant. No evident adenopathy in the abdomen or pelvis. Reproductive: Uterus is anteverted. No adnexal masses evident. There is a small amount of fluid in the cul-de-sac, stable. Other: No well-defined abscess in the abdomen or pelvis. Minimal fluid tracking along the right lateral conal fascia is likely secondary to recent surgery. No ascites beyond the mild fluid in the cul-de-sac. Musculoskeletal: No blastic or lytic bone lesions. No intramuscular lesions. Evidence of recent surgery at the umbilicus level. IMPRESSION: 1. There has been an increase in a well-defined fluid collection in the gallbladder fossa compared to 2 days prior with fluid in this area currently measuring 5.9 x 2.5 cm. No appreciable air within this collection noted. Early developing infectious lesion in this area must be of concern given the increase in fluid in this area. This fluid collection is somewhat better defined than 2 days prior. Close clinical and imaging surveillance in this regard advised. 2. Postoperative soft tissue stranding in the right abdomen with slight fluid tracking into the right lateral conal fascia. No evidence  suggesting abscess in these areas. 3. No bowel wall thickening or bowel obstruction. Appendix appears normal. 4. Nonobstructing calculi in each kidney. No hydronephrosis or ureteral calculus. Urinary bladder wall thickness normal. Electronically Signed   By: Bretta Bang III M.D.   On: 05/13/2020 07:55   CT ABDOMEN PELVIS W CONTRAST  Result Date: 05/11/2020 CLINICAL DATA:  Laparoscopic cholecystectomy 05/09/2020, continued abdominal pain EXAM: CT ABDOMEN AND PELVIS WITH CONTRAST TECHNIQUE: Multidetector CT imaging of the abdomen and pelvis was performed using the standard protocol following bolus administration of intravenous contrast. CONTRAST:  OMNIPAQUE IOHEXOL 300 MG/ML  SOLN COMPARISON:  05/09/2020, 03/27/2019 FINDINGS: Lower chest: No acute pleural or parenchymal lung disease. Hepatobiliary: Postsurgical changes from cholecystectomy, with minimal fluid and fat stranding in the gallbladder fossa compatible with postsurgical change. No evidence of fluid collection or abscess. The liver is unremarkable without biliary dilation. Pancreas: Unremarkable. No pancreatic ductal dilatation or surrounding inflammatory changes. Spleen: Normal in size without focal abnormality. Adrenals/Urinary Tract: There are bilateral nonobstructing renal calculi. There are two 3 mm nonobstructing calculi on the right, with a single 3 mm calculus on the left. No obstructive uropathy within either kidney. The adrenals and bladder are unremarkable. Stomach/Bowel: No bowel obstruction or ileus. Normal appendix central upper pelvis. No bowel wall thickening or inflammatory change. Vascular/Lymphatic: Aortic atherosclerosis. No enlarged  abdominal or pelvic lymph nodes. Reproductive: Uterus and bilateral adnexa are unremarkable. Other: There is a small amount of pneumoperitoneum within the upper abdomen consistent with recent laparoscopic surgery. Trace free fluid in the right upper quadrant and lower pelvis consistent with  postsurgical change. Postsurgical change at the umbilicus from laparoscopy port. No fluid collection. No abdominal wall hernia. Musculoskeletal: No acute or destructive bony lesions. Reconstructed images demonstrate no additional findings. IMPRESSION: 1. Expected postsurgical changes from laparoscopic cholecystectomy. No evidence of fluid collection or abscess. 2. Bilateral nonobstructing renal calculi. 3.  Aortic Atherosclerosis (ICD10-I70.0). Electronically Signed   By: Sharlet Salina M.D.   On: 05/11/2020 19:01   US Abdomen Limited  Result Date: 05/13/2020 CLINICAL DATA:  Pain post cholecystectomy EXAM: ULTRASOUND ABDOMEN LIMITED RIGHT UPPER QUADRANT COMPARISON:  05/11/2020, ultrasound 05/09/2020 FINDINGS: Gallbladder: Post cholecystectomy. Question some fluid towards the gallbladder fossa the exam was terminated prematurely due to patient discomfort. Common bile duct: Nonvisualized Liver: Incomplete assessment of the liver with only partial visualization of the left lobe. Other: Exam was terminated prematurely by the patient due to inability to tolerate scanning procedure with exquisite pain and tenderness upon transducer pressure. IMPRESSION: Premature termination of the exam due to patient discomfort and inability to tolerate scanning procedure. Post cholecystectomy. Question some fluid towards the gallbladder fossa. Minimal visualization of the left lobe liver. Nonvisualization of the common bile duct. Electronically Signed   By: Kreg Shropshire M.D.   On: 05/13/2020 06:55   DG CHEST PORT 1 VIEW  Result Date: 05/13/2020 CLINICAL DATA:  One month postpartum with recent laparoscopic cholecystectomy. Right upper quadrant abdominal pain. EXAM: PORTABLE CHEST 1 VIEW COMPARISON:  None. FINDINGS: The heart size and mediastinal contours are within normal limits. Low lung volumes. No focal airspace disease. The visualized skeletal structures are unremarkable. IMPRESSION: No evidence of acute cardiopulmonary  disease. Electronically Signed   By: Caprice Renshaw   On: 05/13/2020 09:40   DG ERCP BILIARY & PANCREATIC DUCTS  Result Date: 05/15/2020 CLINICAL DATA:  30 year old female with bile leak following cholecystectomy. EXAM: ERCP TECHNIQUE: Multiple spot images obtained with the fluoroscopic device and submitted for interpretation post-procedure. FLUOROSCOPY TIME:  Fluoroscopy Time:  2 minutes 30 seconds Radiation Exposure Index (if provided by the fluoroscopic device): 58.3 mGy Number of Acquired Spot Images: 0 COMPARISON:  None. FINDINGS: A total of 7 intraoperative spot images are submitted for review. The images demonstrate a flexible endoscope in the descending duodenum. Initial wire cannulation of the pancreatic duct with placement of a plastic pancreatic stent. Subsequently, the common bile duct is cannulated. Contrast injection performed. No frank extravasation on the provided images. A plastic biliary stent is then placed. Percutaneous drainage catheter noted in the right upper quadrant consistent with recent gallbladder fossa drain placement. IMPRESSION: ERCP with placement of pancreatic and biliary plastic stents. These images were submitted for radiologic interpretation only. Please see the procedural report for the amount of contrast and the fluoroscopy time utilized. Electronically Signed   By: Malachy Moan M.D.   On: 05/15/2020 10:23   CT IMAGE GUIDED DRAINAGE BY PERCUTANEOUS CATHETER  Result Date: 05/14/2020 INDICATION: 30 year old female with history of biloma formation the gallbladder fossa status post laparoscopic cholecystectomy. EXAM: CT IMAGE GUIDED DRAINAGE BY PERCUTANEOUS CATHETER COMPARISON:  None. MEDICATIONS: The patient is currently admitted to the hospital and receiving intravenous antibiotics. The antibiotics were administered within an appropriate time frame prior to the initiation of the procedure. ANESTHESIA/SEDATION: Moderate (conscious) sedation was employed during this  procedure.  A total of Versed 4 mg and Fentanyl 100 mcg was administered intravenously. Moderate Sedation Time: 15 minutes. The patient's level of consciousness and vital signs were monitored continuously by radiology nursing throughout the procedure under my direct supervision. CONTRAST:  None COMPLICATIONS: None immediate. PROCEDURE: Informed written consent was obtained from the patient after a discussion of the risks, benefits and alternatives to treatment. The patient was placed supine on the CT gantry and a pre procedural CT was performed re-demonstrating the known abscess/fluid collection within the gallbladder fossa. The procedure was planned. A timeout was performed prior to the initiation of the procedure. The right upper quadrant was prepped and draped in the usual sterile fashion. The overlying soft tissues were anesthetized with 1% lidocaine with epinephrine. Appropriate trajectory was planned with the use of a 22 gauge spinal needle. A 21 gauge Chiba needle was advanced into the abscess/fluid collection followed by placement of a 0.018 inch wire. Appropriate position was confirmed with a limited CT scan. Over the wire, a 6 [place] transition dilator was placed followed by a short Amplatz super stiff wire was coiled within the collection. Appropriate positioning was confirmed with a limited CT scan. The tract was serially dilated allowing placement of a 10.2 [place] all-purpose drainage catheter. Appropriate positioning was confirmed with a limited postprocedural CT scan. Approximately 20 ml of thin maroon/brown fluid was aspirated. The tube was connected to a bulb suction and sutured in place. A dressing was placed. The patient tolerated the procedure well without immediate post procedural complication. IMPRESSION: Successful CT guided placement of a 10.2 [place] all purpose drain catheter into the gallbladder fossa fluid collection with aspiration of approximately 20 mL of dark brown, maroon fluid.  Samples were sent to the laboratory as requested by the ordering clinical team. Marliss Cootsylan Suttle, MD [facility] Electronically Signed   By: Marliss Cootsylan  Suttle MD   On: 05/14/2020 15:02   NM HEPATOBILIARY LEAK (POST-SURGICAL)  Result Date: 05/13/2020 CLINICAL DATA:  Status post cholecystectomy with right upper quadrant pain. EXAM: NUCLEAR MEDICINE HEPATOBILIARY IMAGING TECHNIQUE: Sequential images of the abdomen were obtained out to 60 minutes following intravenous administration of radiopharmaceutical. RADIOPHARMACEUTICALS:  4.9 mCi Tc-9457m  Choletec IV COMPARISON:  CT 05/13/20 FINDINGS: Prompt uptake and biliary excretion of activity by the liver is seen. Biliary activity passes into small bowel, consistent with patent common bile duct. At the end of the first hour a small focus mild increased radiotracer accumulation is noted within the gallbladder fossa. During the second hour there is persistent and mildly progressive radiotracer accumulation within the gallbladder fossa. IMPRESSION: 1. Small focus of mild progressive radiotracer accumulation within the gallbladder fossa. Primary differential consideration includes: small contained leak versus radiotracer accumulation within gallbladder remnant. 2. Common bile duct appears patent with most of the radiotracer accumulating within the small bowel loops. 3. These results were shared via EPIC CHAT at the time of interpretation on 05/13/2020 at 12:53 pm to provider [facility] , who acknowledged these results. Electronically Signed   By: Signa Kellaylor  Stroud M.D.   On: 05/13/2020 12:53    Labs:  CBC: Recent Labs    05/09/20 0428 05/11/20 1734 05/13/20 0549 05/14/20 0530  WBC 10.1 12.2* 11.6* 8.3  HGB 10.9* 11.1* 11.0* 9.8*  HCT 35.6* 35.5* 35.4* 32.6*  PLT 338 309 339 273    COAGS: No results for input(s): INR, APTT in the last 8760 hours.  BMP: Recent Labs    10/05/19 2227 05/09/20 0428  05/11/20 1734 05/13/20 0549 05/14/20 0530  NA 138 138 136 136 139  K 3.6 3.9 3.7 3.7 4.1  CL 107 104 102 103 106  CO2 22 25 25 24 26   GLUCOSE 108* 107* 109* 116* 101*  BUN 5* 14 9 9 6   CALCIUM 8.6* 8.9 8.7* 9.0 8.5*  CREATININE 0.54 0.95 0.79 0.85 0.88  GFRNONAA >60 >60 >60 >60 >60  GFRAA >60  --   --   --   --     LIVER FUNCTION TESTS: Recent Labs    05/09/20 0428 05/11/20 1734 05/13/20 0549 05/14/20 0530  BILITOT 0.4 0.6 1.0 0.8  AST 18 26 16 15   ALT 15 26 21 19   ALKPHOS 72 83 91 81  PROT 6.5 6.6 6.9 6.1*  ALBUMIN 3.4* 3.3* 3.5 3.1*    Assessment and Plan:  Biloma drain placed in IR 5/7 draining well Will follow  Electronically Signed: 07/14/20, PA-C 05/15/2020, 1:00 PM   I spent a total of 15 Minutes at the the patient's bedside AND on the patient's hospital floor or unit, greater than 50% of which was counseling/coordinating care for biloma drain

## 2020-05-15 NOTE — Progress Notes (Signed)
Day of Surgery   Subjective/Chief Complaint: Back from ERCP Sore and sleepy    Objective: Vital signs in last 24 hours: Temp:  [97 F (36.1 C)-99.1 F (37.3 C)] 99 F (37.2 C) (05/08 1020) Pulse Rate:  [54-106] 80 (05/08 1020) Resp:  [10-23] 18 (05/08 1020) BP: (82-150)/(37-106) 129/67 (05/08 1020) SpO2:  [90 %-100 %] 94 % (05/08 1020) Last BM Date: 05/13/20  Intake/Output from previous day: 05/07 0701 - 05/08 0700 In: 3038.9 [P.O.:150; I.V.:2883.9] Out: 3560 [Urine:3500; Drains:60] Intake/Output this shift: Total I/O In: 1149.4 [P.O.:60; I.V.:1089.4] Out: 0   General appearance: alert and cooperative Resp: WOB normal  Cardio: NSR Incision/Wound:DRAIN IN PLACE BILIOUS  MILD DIFFUSE TENDERNESS Soft not rigid    Lab Results:  Recent Labs    05/13/20 0549 05/14/20 0530  WBC 11.6* 8.3  HGB 11.0* 9.8*  HCT 35.4* 32.6*  PLT 339 273   BMET Recent Labs    05/13/20 0549 05/14/20 0530  NA 136 139  K 3.7 4.1  CL 103 106  CO2 24 26  GLUCOSE 116* 101*  BUN 9 6  CREATININE 0.85 0.88  CALCIUM 9.0 8.5*   PT/INR No results for input(s): LABPROT, INR in the last 72 hours. ABG No results for input(s): PHART, HCO3 in the last 72 hours.  Invalid input(s): PCO2, PO2  Studies/Results: DG ERCP BILIARY & PANCREATIC DUCTS  Result Date: 05/15/2020 CLINICAL DATA:  30 year old female with bile leak following cholecystectomy. EXAM: ERCP TECHNIQUE: Multiple spot images obtained with the fluoroscopic device and submitted for interpretation post-procedure. FLUOROSCOPY TIME:  Fluoroscopy Time:  2 minutes 30 seconds Radiation Exposure Index (if provided by the fluoroscopic device): 58.3 mGy Number of Acquired Spot Images: 0 COMPARISON:  None. FINDINGS: A total of 7 intraoperative spot images are submitted for review. The images demonstrate a flexible endoscope in the descending duodenum. Initial wire cannulation of the pancreatic duct with placement of a plastic pancreatic stent.  Subsequently, the common bile duct is cannulated. Contrast injection performed. No frank extravasation on the provided images. A plastic biliary stent is then placed. Percutaneous drainage catheter noted in the right upper quadrant consistent with recent gallbladder fossa drain placement. IMPRESSION: ERCP with placement of pancreatic and biliary plastic stents. These images were submitted for radiologic interpretation only. Please see the procedural report for the amount of contrast and the fluoroscopy time utilized. Electronically Signed   By: Malachy Moan M.D.   On: 05/15/2020 10:23   CT IMAGE GUIDED DRAINAGE BY PERCUTANEOUS CATHETER  Result Date: 05/14/2020 INDICATION: 30 year old female with history of biloma formation the gallbladder fossa status post laparoscopic cholecystectomy. EXAM: CT IMAGE GUIDED DRAINAGE BY PERCUTANEOUS CATHETER COMPARISON:  None. MEDICATIONS: The patient is currently admitted to the hospital and receiving intravenous antibiotics. The antibiotics were administered within an appropriate time frame prior to the initiation of the procedure. ANESTHESIA/SEDATION: Moderate (conscious) sedation was employed during this procedure. A total of Versed 4 mg and Fentanyl 100 mcg was administered intravenously. Moderate Sedation Time: 15 minutes. The patient's level of consciousness and vital signs were monitored continuously by radiology nursing throughout the procedure under my direct supervision. CONTRAST:  None COMPLICATIONS: None immediate. PROCEDURE: Informed written consent was obtained from the patient after a discussion of the risks, benefits and alternatives to treatment. The patient was placed supine on the CT gantry and a pre procedural CT was performed re-demonstrating the known abscess/fluid collection within the gallbladder fossa. The procedure was planned. A timeout was performed prior to the initiation of  the procedure. The right upper quadrant was prepped and draped in the  usual sterile fashion. The overlying soft tissues were anesthetized with 1% lidocaine with epinephrine. Appropriate trajectory was planned with the use of a 22 gauge spinal needle. A 21 gauge Chiba needle was advanced into the abscess/fluid collection followed by placement of a 0.018 inch wire. Appropriate position was confirmed with a limited CT scan. Over the wire, a 6 [place] transition dilator was placed followed by a short Amplatz super stiff wire was coiled within the collection. Appropriate positioning was confirmed with a limited CT scan. The tract was serially dilated allowing placement of a 10.2 [place] all-purpose drainage catheter. Appropriate positioning was confirmed with a limited postprocedural CT scan. Approximately 20 ml of thin maroon/brown fluid was aspirated. The tube was connected to a bulb suction and sutured in place. A dressing was placed. The patient tolerated the procedure well without immediate post procedural complication. IMPRESSION: Successful CT guided placement of a 10.2 [place] all purpose drain catheter into the gallbladder fossa fluid collection with aspiration of approximately 20 mL of dark brown, maroon fluid. Samples were sent to the laboratory as requested by the ordering clinical team. Marliss Coots, MD [facility] Electronically Signed   By: Marliss Coots MD   On: 05/14/2020 15:02   NM HEPATOBILIARY LEAK (POST-SURGICAL)  Result Date: 05/13/2020 CLINICAL DATA:  Status post cholecystectomy with right upper quadrant pain. EXAM: NUCLEAR MEDICINE HEPATOBILIARY IMAGING TECHNIQUE: Sequential images of the abdomen were obtained out to 60 minutes following intravenous administration of radiopharmaceutical. RADIOPHARMACEUTICALS:  4.9 mCi Tc-57m  Choletec IV COMPARISON:  CT 05/13/20 FINDINGS: Prompt uptake and biliary excretion of activity by the liver is seen. Biliary activity passes into small bowel, consistent with patent  common bile duct. At the end of the first hour a small focus mild increased radiotracer accumulation is noted within the gallbladder fossa. During the second hour there is persistent and mildly progressive radiotracer accumulation within the gallbladder fossa. IMPRESSION: 1. Small focus of mild progressive radiotracer accumulation within the gallbladder fossa. Primary differential consideration includes: small contained leak versus radiotracer accumulation within gallbladder remnant. 2. Common bile duct appears patent with most of the radiotracer accumulating within the small bowel loops. 3. These results were shared via EPIC CHAT at the time of interpretation on 05/13/2020 at 12:53 pm to provider [facility] , who acknowledged these results. Electronically Signed   By: Signa Kell M.D.   On: 05/13/2020 12:53    Anti-infectives: Anti-infectives (From admission, onward)   None      Assessment/Plan: s/p Procedure(s): ENDOSCOPIC RETROGRADE CHOLANGIOPANCREATOGRAPHY (ERCP) WITH PROPOFOL (N/A) SPHINCTEROTOMY PANCREATIC STENT PLACEMENT BILIARY STENT PLACEMENT (N/A) Stable  Clear liquid diet  CONT PERCUTANEOUS DRAIN   LOS: 2 days    Dortha Schwalbe MD  05/15/2020

## 2020-05-15 NOTE — Interval H&P Note (Signed)
History and Physical Interval Note: 30/female with bile leak post cholecystectomy, s/p IR drain placement for an ERCP with stent placement.  05/15/2020 7:15 AM  Jordan Chung  has presented today for ERCP, with the diagnosis of bile leak.  The various methods of treatment have been discussed with the patient and family. After consideration of risks, benefits and other options for treatment, the patient has consented to  Procedure(s): ENDOSCOPIC RETROGRADE CHOLANGIOPANCREATOGRAPHY (ERCP) WITH PROPOFOL (N/A) as a surgical intervention.  The patient's history has been reviewed, patient examined, no change in status, stable for surgery.  I have reviewed the patient's chart and labs.  Questions were answered to the patient's satisfaction.     Kerin Salen

## 2020-05-15 NOTE — Anesthesia Procedure Notes (Signed)
Date/Time: 05/15/2020 9:29 AM Performed by: Minerva Ends, CRNA Oxygen Delivery Method: Simple face mask Placement Confirmation: positive ETCO2 and breath sounds checked- equal and bilateral Dental Injury: Teeth and Oropharynx as per pre-operative assessment

## 2020-05-16 ENCOUNTER — Encounter (HOSPITAL_COMMUNITY): Payer: Self-pay | Admitting: Gastroenterology

## 2020-05-16 ENCOUNTER — Other Ambulatory Visit: Payer: Self-pay | Admitting: Gastroenterology

## 2020-05-16 LAB — CBC
HCT: 30.6 % — ABNORMAL LOW (ref 36.0–46.0)
Hemoglobin: 9.2 g/dL — ABNORMAL LOW (ref 12.0–15.0)
MCH: 24.2 pg — ABNORMAL LOW (ref 26.0–34.0)
MCHC: 30.1 g/dL (ref 30.0–36.0)
MCV: 80.5 fL (ref 80.0–100.0)
Platelets: 287 10*3/uL (ref 150–400)
RBC: 3.8 MIL/uL — ABNORMAL LOW (ref 3.87–5.11)
RDW: 17.3 % — ABNORMAL HIGH (ref 11.5–15.5)
WBC: 10.3 10*3/uL (ref 4.0–10.5)
nRBC: 0 % (ref 0.0–0.2)

## 2020-05-16 LAB — COMPREHENSIVE METABOLIC PANEL
ALT: 45 U/L — ABNORMAL HIGH (ref 0–44)
AST: 31 U/L (ref 15–41)
Albumin: 3.2 g/dL — ABNORMAL LOW (ref 3.5–5.0)
Alkaline Phosphatase: 108 U/L (ref 38–126)
Anion gap: 6 (ref 5–15)
BUN: 6 mg/dL (ref 6–20)
CO2: 27 mmol/L (ref 22–32)
Calcium: 8.8 mg/dL — ABNORMAL LOW (ref 8.9–10.3)
Chloride: 109 mmol/L (ref 98–111)
Creatinine, Ser: 0.83 mg/dL (ref 0.44–1.00)
GFR, Estimated: >60 mL/min (ref >60)
Glucose, Bld: 104 mg/dL — ABNORMAL HIGH (ref 70–99)
Potassium: 3.6 mmol/L (ref 3.5–5.1)
Sodium: 142 mmol/L (ref 135–145)
Total Bilirubin: 0.5 mg/dL (ref 0.3–1.2)
Total Protein: 6.3 g/dL — ABNORMAL LOW (ref 6.5–8.1)

## 2020-05-16 LAB — LIPASE, BLOOD: Lipase: 173 U/L — ABNORMAL HIGH (ref 11–51)

## 2020-05-16 MED ORDER — OXYCODONE HCL 5 MG PO TABS
5.0000 mg | ORAL_TABLET | Freq: Four times a day (QID) | ORAL | Status: DC | PRN
  Administered 2020-05-16: 5 mg via ORAL
  Administered 2020-05-17: 10 mg via ORAL
  Filled 2020-05-16: qty 1
  Filled 2020-05-16: qty 2

## 2020-05-16 NOTE — Progress Notes (Signed)
Referring Physician(s): Cornett,T  Supervising Physician: Simonne Come  Patient Status:  [facility] - In-pt  Chief Complaint: Abdominal pain, gallbladder fossa fluid collection/bile leak   Subjective: Patient currently eating breakfast; denies nausea or vomiting but does have some mild right upper quadrant discomfort; underwent common bile duct plastic stent as well as ventral pancreatic duct plastic stent placements yesterday; awaiting possible discharge home today   Allergies: Patient has no known allergies.  Medications: Prior to Admission medications   Medication Sig Start Date End Date Taking? Authorizing Provider  docusate sodium (COLACE) 100 MG capsule Take 1 capsule (100 mg total) by mouth 2 (two) times daily. Patient taking differently: Take 100 mg by mouth daily as needed for mild constipation. 12/05/19 12/04/20 Yes Rasch, Victorino Dike I, NP  oxyCODONE (OXY IR/ROXICODONE) 5 MG immediate release tablet Take 5 mg by mouth every 6 (six) hours as needed for pain. 05/10/20  Yes [provider]  polyethylene glycol (MIRALAX / GLYCOLAX) 17 g packet Take 17 g by mouth daily as needed for mild constipation.   Yes [provider]  Prenatal Vit-Fe Fumarate-FA (MULTIVITAMIN-PRENATAL) 27-0.8 MG TABS tablet Take 1 tablet by mouth daily.   Yes [provider]  traMADol (ULTRAM) 50 MG tablet Take 1 tablet (50 mg total) by mouth every 6 (six) hours as needed for moderate pain or severe pain. 05/09/20  Yes Abigail Miyamoto, MD  metoCLOPramide (REGLAN) 10 MG tablet Take 1 tablet (10 mg total) by mouth 4 (four) times daily -  before meals and at bedtime. Patient not taking: No sig reported 12/05/19 12/04/20  Rasch, Victorino Dike I, NP  ondansetron (ZOFRAN) 4 MG tablet Take 1 tablet (4 mg total) by mouth every 6 (six) hours. 05/11/20   Curatolo, Adam, DO  dicyclomine (BENTYL) 20 MG tablet Take 1 tablet (20 mg total) by mouth 2 (two) times daily. 03/27/19 10/06/19  Lorelee New, PA-C      Vital Signs: BP 96/60 (BP Location: Left Arm)   Pulse (!) 51   Temp 97.9 F (36.6 C) (Oral)   Resp 16   Ht 5\' 8"  (1.727 m)   Wt 229 lb (103.9 kg)   LMP 07/23/2019 (Approximate) Comment: Gravida 2, Para 1  SpO2 100%   BMI 34.82 kg/m   Physical Exam awake, alert.  Right upper quadrant drain intact, insertion site okay, site mildly tender to palpation, output 60 cc of bilious appearing fluid  Imaging: CT ABDOMEN PELVIS W CONTRAST  Result Date: 05/13/2020 CLINICAL DATA:  Recent cholecystectomy with pain EXAM: CT ABDOMEN AND PELVIS WITH CONTRAST TECHNIQUE: Multidetector CT imaging of the abdomen and pelvis was performed using the standard protocol following bolus administration of intravenous contrast. CONTRAST:  07/13/2020 OMNIPAQUE IOHEXOL 300 MG/ML  SOLN COMPARISON:  May 11, 2020 FINDINGS: Lower chest: There is bibasilar atelectasis. Lung bases otherwise are clear. Hepatobiliary: No focal liver lesions are appreciable. Gallbladder is absent. There is slightly more fluid in the gallbladder fossa region compared to 2 days prior with fluid collection in the gallbladder fossa region measuring 5.9 x 2.5 cm. This fluid collection is better defined than on study from 2 days prior. There is fat stranding in the medial gallbladder fossa region consistent with recent surgery, similar to 2 days prior. Trace air along the periphery of this fluid collection may be of postoperative etiology. A well-defined air collection within the fluid in the gallbladder fossa is not appreciable. Soft tissue stranding is noted along the lateral right abdomen consistent with recent surgery,  essentially stable. There is no appreciable biliary duct dilatation. Pancreas: No pancreatic mass or inflammatory focus. Spleen: No splenic lesions are evident. Adrenals/Urinary Tract: Adrenals bilaterally appear normal. There is no evident mass or hydronephrosis on either side. There is a 2 mm calculus with an adjacent 3 mm calculus in the  mid right kidney, unchanged. There is a 2 mm calculus in the mid left kidney, unchanged. No ureteral calculus evident on either side. Urinary bladder is midline with wall thickness within normal limits. Stomach/Bowel: There is no bowel wall or mesenteric thickening. No evident bowel obstruction. Terminal ileum appears normal. Appendix appears normal. There is no evident free air or portal venous air. Small amount of pneumoperitoneum seen 2 days prior no longer evident. Vascular/Lymphatic: No abdominal aortic aneurysm. No arterial vascular lesions are evident. Major venous structures appear patent. There is a circumaortic left renal vein, an anatomic variant. No evident adenopathy in the abdomen or pelvis. Reproductive: Uterus is anteverted. No adnexal masses evident. There is a small amount of fluid in the cul-de-sac, stable. Other: No well-defined abscess in the abdomen or pelvis. Minimal fluid tracking along the right lateral conal fascia is likely secondary to recent surgery. No ascites beyond the mild fluid in the cul-de-sac. Musculoskeletal: No blastic or lytic bone lesions. No intramuscular lesions. Evidence of recent surgery at the umbilicus level. IMPRESSION: 1. There has been an increase in a well-defined fluid collection in the gallbladder fossa compared to 2 days prior with fluid in this area currently measuring 5.9 x 2.5 cm. No appreciable air within this collection noted. Early developing infectious lesion in this area must be of concern given the increase in fluid in this area. This fluid collection is somewhat better defined than 2 days prior. Close clinical and imaging surveillance in this regard advised. 2. Postoperative soft tissue stranding in the right abdomen with slight fluid tracking into the right lateral conal fascia. No evidence suggesting abscess in these areas. 3. No bowel wall thickening or bowel obstruction. Appendix appears normal. 4. Nonobstructing calculi in each kidney. No  hydronephrosis or ureteral calculus. Urinary bladder wall thickness normal. Electronically Signed   By: Bretta Bang III M.D.   On: 05/13/2020 07:55   US Abdomen Limited  Result Date: 05/13/2020 CLINICAL DATA:  Pain post cholecystectomy EXAM: ULTRASOUND ABDOMEN LIMITED RIGHT UPPER QUADRANT COMPARISON:  05/11/2020, ultrasound 05/09/2020 FINDINGS: Gallbladder: Post cholecystectomy. Question some fluid towards the gallbladder fossa the exam was terminated prematurely due to patient discomfort. Common bile duct: Nonvisualized Liver: Incomplete assessment of the liver with only partial visualization of the left lobe. Other: Exam was terminated prematurely by the patient due to inability to tolerate scanning procedure with exquisite pain and tenderness upon transducer pressure. IMPRESSION: Premature termination of the exam due to patient discomfort and inability to tolerate scanning procedure. Post cholecystectomy. Question some fluid towards the gallbladder fossa. Minimal visualization of the left lobe liver. Nonvisualization of the common bile duct. Electronically Signed   By: Kreg Shropshire M.D.   On: 05/13/2020 06:55   DG CHEST PORT 1 VIEW  Result Date: 05/13/2020 CLINICAL DATA:  One month postpartum with recent laparoscopic cholecystectomy. Right upper quadrant abdominal pain. EXAM: PORTABLE CHEST 1 VIEW COMPARISON:  None. FINDINGS: The heart size and mediastinal contours are within normal limits. Low lung volumes. No focal airspace disease. The visualized skeletal structures are unremarkable. IMPRESSION: No evidence of acute cardiopulmonary disease. Electronically Signed   By: Caprice Renshaw   On: 05/13/2020 09:40   DG ERCP  BILIARY & PANCREATIC DUCTS  Result Date: 05/15/2020 CLINICAL DATA:  30 year old female with bile leak following cholecystectomy. EXAM: ERCP TECHNIQUE: Multiple spot images obtained with the fluoroscopic device and submitted for interpretation post-procedure. FLUOROSCOPY TIME:   Fluoroscopy Time:  2 minutes 30 seconds Radiation Exposure Index (if provided by the fluoroscopic device): 58.3 mGy Number of Acquired Spot Images: 0 COMPARISON:  None. FINDINGS: A total of 7 intraoperative spot images are submitted for review. The images demonstrate a flexible endoscope in the descending duodenum. Initial wire cannulation of the pancreatic duct with placement of a plastic pancreatic stent. Subsequently, the common bile duct is cannulated. Contrast injection performed. No frank extravasation on the provided images. A plastic biliary stent is then placed. Percutaneous drainage catheter noted in the right upper quadrant consistent with recent gallbladder fossa drain placement. IMPRESSION: ERCP with placement of pancreatic and biliary plastic stents. These images were submitted for radiologic interpretation only. Please see the procedural report for the amount of contrast and the fluoroscopy time utilized. Electronically Signed   By: Malachy MoanHeath  McCullough M.D.   On: 05/15/2020 10:23   CT IMAGE GUIDED DRAINAGE BY PERCUTANEOUS CATHETER  Result Date: 05/14/2020 INDICATION: 30 year old female with history of biloma formation the gallbladder fossa status post laparoscopic cholecystectomy. EXAM: CT IMAGE GUIDED DRAINAGE BY PERCUTANEOUS CATHETER COMPARISON:  None. MEDICATIONS: The patient is currently admitted to the hospital and receiving intravenous antibiotics. The antibiotics were administered within an appropriate time frame prior to the initiation of the procedure. ANESTHESIA/SEDATION: Moderate (conscious) sedation was employed during this procedure. A total of Versed 4 mg and Fentanyl 100 mcg was administered intravenously. Moderate Sedation Time: 15 minutes. The patient's level of consciousness and vital signs were monitored continuously by radiology nursing throughout the procedure under my direct supervision. CONTRAST:  None COMPLICATIONS: None immediate. PROCEDURE: Informed written consent was  obtained from the patient after a discussion of the risks, benefits and alternatives to treatment. The patient was placed supine on the CT gantry and a pre procedural CT was performed re-demonstrating the known abscess/fluid collection within the gallbladder fossa. The procedure was planned. A timeout was performed prior to the initiation of the procedure. The right upper quadrant was prepped and draped in the usual sterile fashion. The overlying soft tissues were anesthetized with 1% lidocaine with epinephrine. Appropriate trajectory was planned with the use of a 22 gauge spinal needle. A 21 gauge Chiba needle was advanced into the abscess/fluid collection followed by placement of a 0.018 inch wire. Appropriate position was confirmed with a limited CT scan. Over the wire, a 6 [place] transition dilator was placed followed by a short Amplatz super stiff wire was coiled within the collection. Appropriate positioning was confirmed with a limited CT scan. The tract was serially dilated allowing placement of a 10.2 [place] all-purpose drainage catheter. Appropriate positioning was confirmed with a limited postprocedural CT scan. Approximately 20 ml of thin maroon/brown fluid was aspirated. The tube was connected to a bulb suction and sutured in place. A dressing was placed. The patient tolerated the procedure well without immediate post procedural complication. IMPRESSION: Successful CT guided placement of a 10.2 [place] all purpose drain catheter into the gallbladder fossa fluid collection with aspiration of approximately 20 mL of dark brown, maroon fluid. Samples were sent to the laboratory as requested by the ordering clinical team. Marliss Cootsylan Suttle, MD [facility] Electronically Signed   By: Marliss Cootsylan  Suttle MD   On: 05/14/2020 15:02   NM HEPATOBILIARY LEAK (  POST-SURGICAL)  Result Date: 05/13/2020 CLINICAL DATA:  Status post cholecystectomy with right upper  quadrant pain. EXAM: NUCLEAR MEDICINE HEPATOBILIARY IMAGING TECHNIQUE: Sequential images of the abdomen were obtained out to 60 minutes following intravenous administration of radiopharmaceutical. RADIOPHARMACEUTICALS:  4.9 mCi Tc-62m  Choletec IV COMPARISON:  CT 05/13/20 FINDINGS: Prompt uptake and biliary excretion of activity by the liver is seen. Biliary activity passes into small bowel, consistent with patent common bile duct. At the end of the first hour a small focus mild increased radiotracer accumulation is noted within the gallbladder fossa. During the second hour there is persistent and mildly progressive radiotracer accumulation within the gallbladder fossa. IMPRESSION: 1. Small focus of mild progressive radiotracer accumulation within the gallbladder fossa. Primary differential consideration includes: small contained leak versus radiotracer accumulation within gallbladder remnant. 2. Common bile duct appears patent with most of the radiotracer accumulating within the small bowel loops. 3. These results were shared via EPIC CHAT at the time of interpretation on 05/13/2020 at 12:53 pm to provider [facility] , who acknowledged these results. Electronically Signed   By: Signa Kell M.D.   On: 05/13/2020 12:53    Labs:  CBC: Recent Labs    05/09/20 0428 05/11/20 1734 05/13/20 0549 05/14/20 0530  WBC 10.1 12.2* 11.6* 8.3  HGB 10.9* 11.1* 11.0* 9.8*  HCT 35.6* 35.5* 35.4* 32.6*  PLT 338 309 339 273    COAGS: No results for input(s): INR, APTT in the last 8760 hours.  BMP: Recent Labs    10/05/19 2227 05/09/20 0428 05/11/20 1734 05/13/20 0549 05/14/20 0530  NA 138 138 136 136 139  K 3.6 3.9 3.7 3.7 4.1  CL 107 104 102 103 106  CO2 22 25 25 24 26   GLUCOSE 108* 107* 109* 116* 101*  BUN 5* 14 9 9 6   CALCIUM 8.6* 8.9 8.7* 9.0 8.5*  CREATININE 0.54 0.95 0.79 0.85 0.88  GFRNONAA >60 >60 >60 >60 >60  GFRAA >60  --   --   --   --     LIVER FUNCTION TESTS: Recent Labs     05/09/20 0428 05/11/20 1734 05/13/20 0549 05/14/20 0530  BILITOT 0.4 0.6 1.0 0.8  AST 18 26 16 15   ALT 15 26 21 19   ALKPHOS 72 83 91 81  PROT 6.5 6.6 6.9 6.1*  ALBUMIN 3.4* 3.3* 3.5 3.1*    Assessment and Plan: Patient with history of cholecystitis and laparoscopic cholecystectomy on 05/09/20 with postop gallbladder fossa fluid collection/biloma; status post drain placement on 5/7; afebrile, no new labs today, bile cultures negative to date; as outpatient recommend once daily flush of drain with 5 cc sterile saline, output recording and gauze dressing change every 2 to 3 days; patient will be scheduled for IR drain clinic follow-up; call [phone] with any drain related questions.   Electronically Signed: D. , PA-C 05/16/2020, 10:44 AM   I spent a total of 15 minutes at the the patient's bedside AND on the patient's hospital floor or unit, greater than 50% of which was counseling/coordinating care for gallbladder fossa fluid collection/biloma drain    Patient ID: 7/[id number], female   DOB: February 09, 1990, 30 y.o.   MRN: [id number]

## 2020-05-16 NOTE — Progress Notes (Signed)
1 Day Post-Op   Subjective/Chief Complaint: Doing better.  No n/v on liquids.     Objective: Vital signs in last 24 hours: Temp:  [97.4 F (36.3 C)-99 F (37.2 C)] 97.9 F (36.6 C) (05/09 0546) Pulse Rate:  [51-106] 51 (05/09 0546) Resp:  [16-23] 16 (05/09 0546) BP: (82-129)/(37-79) 96/60 (05/09 0546) SpO2:  [88 %-100 %] 100 % (05/09 0546) Last BM Date: 05/13/20  Intake/Output from previous day: 05/08 0701 - 05/09 0700 In: 4583.5 [P.O.:1090; I.V.:3488.5] Out: 4160 [Urine:4100; Drains:60] Intake/Output this shift: No intake/output data recorded.  General appearance: alert and cooperative Resp: WOB normal  Cardio: NSR Incision/Wound:DRAIN IN PLACE BILIOUS  Non tender  Soft not rigid    Lab Results:  Recent Labs    05/14/20 0530  WBC 8.3  HGB 9.8*  HCT 32.6*  PLT 273   BMET Recent Labs    05/14/20 0530  NA 139  K 4.1  CL 106  CO2 26  GLUCOSE 101*  BUN 6  CREATININE 0.88  CALCIUM 8.5*   PT/INR No results for input(s): LABPROT, INR in the last 72 hours. ABG No results for input(s): PHART, HCO3 in the last 72 hours.  Invalid input(s): PCO2, PO2  Studies/Results: DG ERCP BILIARY & PANCREATIC DUCTS  Result Date: 05/15/2020 CLINICAL DATA:  30 year old female with bile leak following cholecystectomy. EXAM: ERCP TECHNIQUE: Multiple spot images obtained with the fluoroscopic device and submitted for interpretation post-procedure. FLUOROSCOPY TIME:  Fluoroscopy Time:  2 minutes 30 seconds Radiation Exposure Index (if provided by the fluoroscopic device): 58.3 mGy Number of Acquired Spot Images: 0 COMPARISON:  None. FINDINGS: A total of 7 intraoperative spot images are submitted for review. The images demonstrate a flexible endoscope in the descending duodenum. Initial wire cannulation of the pancreatic duct with placement of a plastic pancreatic stent. Subsequently, the common bile duct is cannulated. Contrast injection performed. No frank extravasation on the  provided images. A plastic biliary stent is then placed. Percutaneous drainage catheter noted in the right upper quadrant consistent with recent gallbladder fossa drain placement. IMPRESSION: ERCP with placement of pancreatic and biliary plastic stents. These images were submitted for radiologic interpretation only. Please see the procedural report for the amount of contrast and the fluoroscopy time utilized. Electronically Signed   By: Malachy Moan M.D.   On: 05/15/2020 10:23   CT IMAGE GUIDED DRAINAGE BY PERCUTANEOUS CATHETER  Result Date: 05/14/2020 INDICATION: 30 year old female with history of biloma formation the gallbladder fossa status post laparoscopic cholecystectomy. EXAM: CT IMAGE GUIDED DRAINAGE BY PERCUTANEOUS CATHETER COMPARISON:  None. MEDICATIONS: The patient is currently admitted to the hospital and receiving intravenous antibiotics. The antibiotics were administered within an appropriate time frame prior to the initiation of the procedure. ANESTHESIA/SEDATION: Moderate (conscious) sedation was employed during this procedure. A total of Versed 4 mg and Fentanyl 100 mcg was administered intravenously. Moderate Sedation Time: 15 minutes. The patient's level of consciousness and vital signs were monitored continuously by radiology nursing throughout the procedure under my direct supervision. CONTRAST:  None COMPLICATIONS: None immediate. PROCEDURE: Informed written consent was obtained from the patient after a discussion of the risks, benefits and alternatives to treatment. The patient was placed supine on the CT gantry and a pre procedural CT was performed re-demonstrating the known abscess/fluid collection within the gallbladder fossa. The procedure was planned. A timeout was performed prior to the initiation of the procedure. The right upper quadrant was prepped and draped in the usual sterile fashion. The overlying soft tissues  were anesthetized with 1% lidocaine with epinephrine.  Appropriate trajectory was planned with the use of a 22 gauge spinal needle. A 21 gauge Chiba needle was advanced into the abscess/fluid collection followed by placement of a 0.018 inch wire. Appropriate position was confirmed with a limited CT scan. Over the wire, a 6 [place] transition dilator was placed followed by a short Amplatz super stiff wire was coiled within the collection. Appropriate positioning was confirmed with a limited CT scan. The tract was serially dilated allowing placement of a 10.2 [place] all-purpose drainage catheter. Appropriate positioning was confirmed with a limited postprocedural CT scan. Approximately 20 ml of thin maroon/brown fluid was aspirated. The tube was connected to a bulb suction and sutured in place. A dressing was placed. The patient tolerated the procedure well without immediate post procedural complication. IMPRESSION: Successful CT guided placement of a 10.2 [place] all purpose drain catheter into the gallbladder fossa fluid collection with aspiration of approximately 20 mL of dark brown, maroon fluid. Samples were sent to the laboratory as requested by the ordering clinical team. Marliss Coots, MD [facility] Electronically Signed   By: Marliss Coots MD   On: 05/14/2020 15:02    Anti-infectives: Anti-infectives (From admission, onward)   None      Assessment/Plan: s/p Procedure(s): ENDOSCOPIC RETROGRADE CHOLANGIOPANCREATOGRAPHY (ERCP) WITH PROPOFOL (N/A) SPHINCTEROTOMY PANCREATIC STENT PLACEMENT BILIARY STENT PLACEMENT (N/A)  Advance diet. Decrease IV fluids.  Will not saline lock since she is lactating.   Possibly home later today or tomorrow depending on diet.  Will go home with drain.     LOS: 3 days    Almond Lint MD  05/16/2020

## 2020-05-16 NOTE — Progress Notes (Signed)
[facility] Progress Note  Jordan Chung 30 y.o. 1990-07-13  CC:  Post-cholecystectomy bile leak s/p ERCP with CBD stent and pancreatic duct stent placement 5/8  Subjective: Patient reports continued RUQ abdominal pain today but states it is improving.  She denies nausea/vomiting.  She ate a soft breakfast and has tolerated it this far.  She has not had a BM but states she does not feel constipated.  ROS : Review of Systems  Cardiovascular: Negative for chest pain and palpitations.  Gastrointestinal: Positive for abdominal pain. Negative for blood in stool, diarrhea, heartburn, melena, nausea and vomiting.    Objective: Vital signs in last 24 hours: Vitals:   05/15/20 2048 05/16/20 0546  BP: 102/61 96/60  Pulse: 62 (!) 51  Resp: 16 16  Temp: 98.1 F (36.7 C) 97.9 F (36.6 C)  SpO2: 97% 100%    Physical Exam:  General:  Alert, cooperative, no distress, appears stated age  Head:  Normocephalic, without obvious abnormality, atraumatic  Eyes:  Anicteric sclera, EOMs intact   Lungs:   Clear to auscultation bilaterally, respirations unlabored  Heart:  Regular rate and rhythm, S1, S2 normal  Abdomen:   Soft, mild-moderate RUQ and epigastric tenderness to palpation, bowel sounds active all four quadrants,  no guarding or peritoneal signs  Extremities: Extremities normal, atraumatic, no  edema    Lab Results: Recent Labs    05/14/20 0530  NA 139  K 4.1  CL 106  CO2 26  GLUCOSE 101*  BUN 6  CREATININE 0.88  CALCIUM 8.5*   Recent Labs    05/14/20 0530  AST 15  ALT 19  ALKPHOS 81  BILITOT 0.8  PROT 6.1*  ALBUMIN 3.1*   Recent Labs    05/14/20 0530  WBC 8.3  HGB 9.8*  HCT 32.6*  MCV 80.3  PLT 273   No results for input(s): LABPROT, INR in the last 72 hours.  Assessment: Post-cholecystectomy bile leak s/p ERCP with CBD stent and pancreatic duct stent placement 5/8 -Mildly elevated ALT (45) with otherwise normal LFTs -WBCs 10.3 -Lipase 173, though PD  stent in place  Plan: If diet is tolerated, OK to discharge this evening or tomorrow morning.  Patient will need EGD as an outpatient in 6-8 weeks for removal of CBD stent and pancreatic stent (if this has not fallen out spontaneously).  Edrick Kins PA-C 05/16/2020, 10:30 AM  Contact #  [phone]

## 2020-05-17 MED ORDER — SODIUM CHLORIDE 0.9% FLUSH: 5.0000 mL | Freq: Three times a day (TID) | INTRAVENOUS | 2 refills | Status: DC

## 2020-05-17 MED ORDER — METHOCARBAMOL 500 MG PO TABS: 500.0000 mg | ORAL_TABLET | Freq: Four times a day (QID) | ORAL | 0 refills | Status: DC | PRN

## 2020-05-17 MED ORDER — ACETAMINOPHEN 325 MG PO TABS: 650.0000 mg | ORAL_TABLET | Freq: Four times a day (QID) | ORAL | Status: DC | PRN

## 2020-05-17 MED ORDER — TRAMADOL HCL 50 MG PO TABS: 50.0000 mg | ORAL_TABLET | Freq: Four times a day (QID) | ORAL | 0 refills | Status: DC | PRN

## 2020-05-17 MED ORDER — OXYCODONE HCL 5 MG PO TABS: 5.0000 mg | ORAL_TABLET | Freq: Four times a day (QID) | ORAL | 0 refills | Status: DC | PRN

## 2020-05-17 NOTE — Discharge Instructions (Signed)
Percutaneous Abscess Drain Placement, Care After This sheet gives you information about how to care for yourself after your procedure. Your health care provider may also give you more specific instructions. If you have problems or questions, contact your health care provider. What can I expect after the procedure? After the procedure, it is common to have:  A small amount of bruising and discomfort in the area where the drainage tube (catheter) was placed.  Sleepiness and fatigue. This should go away after the medicines you were given have worn off. Follow these instructions at home: Incision care  Follow instructions from your health care provider about how to take care of your incision. Make sure you: ? Wash your hands with soap and water for at least 20 seconds before and after you change your bandage (dressing). If soap and water are not available, use hand sanitizer. ? Change your dressing as told by your health care provider. ? Leave stitches (sutures), skin glue, or adhesive strips in place. These skin closures may need to stay in place for 2 weeks or longer. If adhesive strip edges start to loosen and curl up, you may trim the loose edges. Do not remove adhesive strips completely unless your health care provider tells you to do that.  Check your incision area every day for signs of infection. Check for: ? More redness, swelling, or pain. ? More fluid or blood. ? Warmth. ? Pus or a bad smell.   Catheter care  Follow instructions from your health care provider about emptying and cleaning your catheter and collection bag or drainage bulb. You may need to clean the catheter every day so it does not clog.  If directed, write down the following information every time you empty your bag: ? The date and time. ? The amount of drainage.  Check for fluid leaking from around your catheter (instead of fluid draining through your catheter). This may be a sign that the drain is no longer  working correctly. Activity  Rest at home for 1-2 days after your procedure.  Return to your normal activities as told by your health care provider. Ask your health care provider what activities are safe for you.  If you were given a sedative during the procedure, it can affect you for several hours. Do not drive or operate machinery until your health care provider says that it is safe. General instructions  Take over-the-counter and prescription medicines only as told by your health care provider.  If you were prescribed an antibiotic medicine, take it as told by your health care provider. Do not stop using the antibiotic even if you start to feel better.  Do not take showers, take baths, swim, or use a hot tub for 24 hours after your procedure or until your health care provider says that this is okay.  Do not use any products that contain nicotine or tobacco, such as cigarettes, e-cigarettes, and chewing tobacco. If you need help quitting, ask your health care provider.  Keep all follow-up visits as told by your health care provider. This is important. Contact a health care provider if:  You have less than 10 mL of drainage a day for 2-3 days in a row, or as directed by your health care provider.  You have any of these signs of infection: ? More redness, swelling, or pain around your incision area. ? More fluid or blood coming from your incision area. ? Warmth coming from your incision area. ? Pus or a bad   smell coming from your incision area.  You have fluid leaking from around your catheter (instead of through your catheter).  You have a fever or chills.  You have pain that does not get better with medicine. Get help right away if:  Your catheter comes out.  You suddenly stop having drainage from your catheter.  You suddenly have blood in the fluid that is draining from your catheter.  You become dizzy or you faint.  You develop a rash.  You have nausea or  vomiting.  You have difficulty breathing or you feel short of breath.  You develop chest pain.  You have problems with your speech or vision.  You have trouble balancing or moving your arms or legs. Summary  It is common to have a small amount of bruising and discomfort in the area where the drainage tube (catheter) was placed.  Follow instructions from your health care provider about emptying and cleaning your catheter and collection bag or drainage bulb.  You may be directed to record the amount of drainage from the bag every time you empty it.  Contact a health care provider if you have more redness, swelling, or pain around your incision area or if you have pain that does not get better with medicine. This information is not intended to replace advice given to you by your health care provider. Make sure you discuss any questions you have with your health care provider. Document Revised: 12/20/2018 Document Reviewed: 12/20/2018 Elsevier Patient Education  2021 Elsevier Inc.    Drain Record  Empty your surgical drain as told by your health care provider. Use this form to write down the amount of fluid that has collected in the drainage container. Bring this form with you to your follow-up visits.  Date __________ Time __________ Amount __________ Date __________ Time __________ Amount __________ Date __________ Time __________ Amount __________ Date __________ Time __________ Amount __________ Date __________ Time __________ Amount __________ Date __________ Time __________ Amount __________ Date __________ Time __________ Amount __________ Date __________ Time __________ Amount __________ Date __________ Time __________ Amount __________ Date __________ Time __________ Amount __________ Date __________ Time __________ Amount __________ Date __________ Time __________ Amount __________ Date __________ Time __________ Amount __________ Date __________ Time __________ Amount  __________ Date __________ Time __________ Amount __________ Date __________ Time __________ Amount __________ Date __________ Time __________ Amount __________ Date __________ Time __________ Amount __________ Date __________ Time __________ Amount __________ Date __________ Time __________ Amount __________ Date __________ Time __________ Amount __________ Date __________ Time __________ Amount __________ Date __________ Time __________ Amount __________ Date __________ Time __________ Amount __________ Date __________ Time __________ Amount __________ Date __________ Time __________ Amount __________ Date __________ Time __________ Amount __________ Date __________ Time __________ Amount __________ Date __________ Time __________ Amount __________ Date __________ Time __________ Amount __________ Date __________ Time __________ Amount __________ Date __________ Time __________ Amount __________ Date __________ Time __________ Amount __________ Date __________ Time __________ Amount __________ Date __________ Time __________ Amount __________ Date __________ Time __________ Amount __________ Date __________ Time __________ Amount __________ Date __________ Time __________ Amount __________ Date __________ Time __________ Amount __________ Date __________ Time __________ Amount __________ Date __________ Time __________ Amount __________ Date __________ Time __________ Amount __________ This information is not intended to replace advice given to you by your health care provider. Make sure you discuss any questions you have with your health care provider. Document Revised: 02/09/2019 Document Reviewed: 10/01/2016 Elsevier Patient Education  2021 Elsevier Inc.  

## 2020-05-17 NOTE — Discharge Summary (Signed)
[facility] Discharge Summary   Patient ID: Juanisha Bautch MRN: [id number] DOB/AGE: [birth year] 32 y.o.  Admit date: 05/13/2020 Discharge date: 05/17/2020  Admitting Diagnosis: S/p laparoscopic cholecystectomy  Post-operative bile leak  Discharge Diagnosis S/p laparoscopic cholecystectomy  Post-operative bile leak  Consultants Interventional radiology  Gastroenterology   Imaging: No results found.  Procedures Dr. Abigail Miyamoto (05/09/20) - Laparoscopic Cholecystectomy  Dr. Domingo Dimes Suttle (05/14/20) - CT guided biloma drain placement  Dr. Kerin Salen (05/15/20) - ERCP with sphincterotomy and stent placement    Hospital Course:  Patient is a 30 year old female who presented to [facility] on POD#4 with abdominal pain s/p laparoscopic cholecystectomy 05/09/20.  Workup showed post-operative bile leak. Patient was admitted and IR and GI consulted. She underwent procedures listed above.  Tolerated procedures well and was transferred to the floor.  Diet was advanced as tolerated.  On POD#8, the patient was voiding well, tolerating diet, ambulating well, pain well controlled, vital signs stable, incisions c/d/i and felt stable for discharge home.  Patient will follow up in our office in 2 weeks and knows to call with questions or concerns. She will call to confirm appointment date/time. She will also follow up with IR drain clinic and GI as outlined below.   Physical Exam: General:  Alert, NAD, pleasant, comfortable Abd:  Soft, ND, mild tenderness, incisions C/D/I, drain with minimal bilious drainage  I or a member of my team have reviewed this patient in the Controlled Substance Database.   Allergies as of 05/17/2020   No Known Allergies     Medication List    STOP taking these medications   metoCLOPramide 10 MG tablet Commonly known as: REGLAN     TAKE these medications   acetaminophen 325 MG tablet Commonly known as: TYLENOL Take 2 tablets (650 mg total) by mouth every 6 (six)  hours as needed for mild pain (or temp > 100).   docusate sodium 100 MG capsule Commonly known as: Colace Take 1 capsule (100 mg total) by mouth 2 (two) times daily. What changed:   when to take this  reasons to take this   methocarbamol 500 MG tablet Commonly known as: ROBAXIN Take 1 tablet (500 mg total) by mouth every 6 (six) hours as needed for muscle spasms.   multivitamin-prenatal 27-0.8 MG Tabs tablet Take 1 tablet by mouth daily.   ondansetron 4 MG tablet Commonly known as: ZOFRAN Take 1 tablet (4 mg total) by mouth every 6 (six) hours.   oxyCODONE 5 MG immediate release tablet Commonly known as: Oxy IR/ROXICODONE Take 1-2 tablets (5-10 mg total) by mouth every 6 (six) hours as needed for moderate pain or severe pain. What changed:   how much to take  reasons to take this   polyethylene glycol 17 g packet Commonly known as: MIRALAX / GLYCOLAX Take 17 g by mouth daily as needed for mild constipation.   sodium chloride flush 0.9 % Soln Commonly known as: NS 5 mLs by Intracatheter route every 8 (eight) hours.   traMADol 50 MG tablet Commonly known as: Ultram Take 1 tablet (50 mg total) by mouth every 6 (six) hours as needed for moderate pain or severe pain.         Follow-up Information    Suttle, Thressa Sheller, MD Follow up.   Specialties: Interventional Radiology, Diagnostic Radiology, Radiology Why: IR drain clinic should reach out to schedule follow up. Please call [phone] for any drain related questions.  Contact information: [address]  [place] [address] [phone]        Kerin Salen, MD. Call.   Specialty: Gastroenterology Why: Call and schedule follow up in 6-8 weeks Contact information: [address] [place] [address] [phone]        Abigail Miyamoto, MD. Go on 05/31/2020.   Specialty: General Surgery Why: 9:50 AM. Please arrive 30 min prior to appointment time for check in. Bring photo ID and  insurance information with you.  Contact information: [address] [place] [address] [phone]               Signed: Juliet Rude , [facility] 05/17/2020, 10:04 AM Please see Amion for pager number during day hours 7:00am-4:30pm

## 2020-05-18 ENCOUNTER — Other Ambulatory Visit: Payer: Self-pay | Admitting: Surgery

## 2020-05-18 ENCOUNTER — Other Ambulatory Visit: Payer: Self-pay | Admitting: Orthopaedic Surgery

## 2020-05-18 DIAGNOSIS — K9189 Other postprocedural complications and disorders of digestive system: Secondary | ICD-10-CM

## 2020-05-18 DIAGNOSIS — K838 Other specified diseases of biliary tract: Secondary | ICD-10-CM

## 2020-05-19 LAB — AEROBIC/ANAEROBIC CULTURE W GRAM STAIN (SURGICAL/DEEP WOUND): Culture: NO GROWTH

## 2020-05-21 ENCOUNTER — Inpatient Hospital Stay (HOSPITAL_COMMUNITY)
Admission: EM | Admit: 2020-05-21 | Discharge: 2020-05-25 | DRG: 394 | Disposition: A | Discharge status: Home / Self Care | Payer: Medicaid Other | Attending: Family Medicine | Admitting: Family Medicine

## 2020-05-21 ENCOUNTER — Emergency Department (HOSPITAL_COMMUNITY): Payer: Medicaid Other

## 2020-05-21 ENCOUNTER — Other Ambulatory Visit: Payer: Self-pay

## 2020-05-21 ENCOUNTER — Encounter (HOSPITAL_COMMUNITY): Payer: Self-pay

## 2020-05-21 DIAGNOSIS — Z79899 Other long term (current) drug therapy: Secondary | ICD-10-CM

## 2020-05-21 DIAGNOSIS — K839 Disease of biliary tract, unspecified: Secondary | ICD-10-CM | POA: Diagnosis present

## 2020-05-21 DIAGNOSIS — R197 Diarrhea, unspecified: Secondary | ICD-10-CM | POA: Diagnosis not present

## 2020-05-21 DIAGNOSIS — D649 Anemia, unspecified: Secondary | ICD-10-CM | POA: Diagnosis present

## 2020-05-21 DIAGNOSIS — D72829 Elevated white blood cell count, unspecified: Secondary | ICD-10-CM | POA: Diagnosis present

## 2020-05-21 DIAGNOSIS — K859 Acute pancreatitis without necrosis or infection, unspecified: Secondary | ICD-10-CM | POA: Diagnosis present

## 2020-05-21 DIAGNOSIS — Y838 Other surgical procedures as the cause of abnormal reaction of the patient, or of later complication, without mention of misadventure at the time of the procedure: Secondary | ICD-10-CM | POA: Diagnosis present

## 2020-05-21 DIAGNOSIS — R0602 Shortness of breath: Secondary | ICD-10-CM | POA: Diagnosis present

## 2020-05-21 DIAGNOSIS — I700 Atherosclerosis of aorta: Secondary | ICD-10-CM | POA: Diagnosis present

## 2020-05-21 DIAGNOSIS — R109 Unspecified abdominal pain: Secondary | ICD-10-CM | POA: Diagnosis present

## 2020-05-21 DIAGNOSIS — Z20822 Contact with and (suspected) exposure to covid-19: Secondary | ICD-10-CM | POA: Diagnosis present

## 2020-05-21 DIAGNOSIS — K858 Other acute pancreatitis without necrosis or infection: Secondary | ICD-10-CM

## 2020-05-21 DIAGNOSIS — K9189 Other postprocedural complications and disorders of digestive system: Principal | ICD-10-CM | POA: Diagnosis present

## 2020-05-21 DIAGNOSIS — J9811 Atelectasis: Secondary | ICD-10-CM | POA: Diagnosis present

## 2020-05-21 DIAGNOSIS — Z9049 Acquired absence of other specified parts of digestive tract: Secondary | ICD-10-CM

## 2020-05-21 DIAGNOSIS — D509 Iron deficiency anemia, unspecified: Secondary | ICD-10-CM | POA: Diagnosis present

## 2020-05-21 LAB — COMPREHENSIVE METABOLIC PANEL
ALT: 19 U/L (ref 0–44)
AST: 30 U/L (ref 15–41)
Albumin: 3.7 g/dL (ref 3.5–5.0)
Alkaline Phosphatase: 80 U/L (ref 38–126)
Anion gap: 10 (ref 5–15)
BUN: 13 mg/dL (ref 6–20)
CO2: 21 mmol/L — ABNORMAL LOW (ref 22–32)
Calcium: 8.8 mg/dL — ABNORMAL LOW (ref 8.9–10.3)
Chloride: 108 mmol/L (ref 98–111)
Creatinine, Ser: 0.83 mg/dL (ref 0.44–1.00)
GFR, Estimated: >60 mL/min (ref >60)
Glucose, Bld: 78 mg/dL (ref 70–99)
Potassium: 4.9 mmol/L (ref 3.5–5.1)
Sodium: 139 mmol/L (ref 135–145)
Total Bilirubin: 1.3 mg/dL — ABNORMAL HIGH (ref 0.3–1.2)
Total Protein: 6.8 g/dL (ref 6.5–8.1)

## 2020-05-21 LAB — CBC WITH DIFFERENTIAL/PLATELET
Abs Immature Granulocytes: 0.09 10*3/uL — ABNORMAL HIGH (ref 0.00–0.07)
Basophils Absolute: 0 10*3/uL (ref 0.0–0.1)
Basophils Relative: 0 %
Eosinophils Absolute: 0.2 10*3/uL (ref 0.0–0.5)
Eosinophils Relative: 1 %
HCT: 37.8 % (ref 36.0–46.0)
Hemoglobin: 11.4 g/dL — ABNORMAL LOW (ref 12.0–15.0)
Immature Granulocytes: 1 %
Lymphocytes Relative: 17 %
Lymphs Abs: 3.3 10*3/uL (ref 0.7–4.0)
MCH: 24.3 pg — ABNORMAL LOW (ref 26.0–34.0)
MCHC: 30.2 g/dL (ref 30.0–36.0)
MCV: 80.4 fL (ref 80.0–100.0)
Monocytes Absolute: 0.8 10*3/uL (ref 0.1–1.0)
Monocytes Relative: 4 %
Neutro Abs: 14.8 10*3/uL — ABNORMAL HIGH (ref 1.7–7.7)
Neutrophils Relative %: 77 %
Platelets: 350 10*3/uL (ref 150–400)
RBC: 4.7 MIL/uL (ref 3.87–5.11)
RDW: 17.8 % — ABNORMAL HIGH (ref 11.5–15.5)
WBC: 19.1 10*3/uL — ABNORMAL HIGH (ref 4.0–10.5)
nRBC: 0 % (ref 0.0–0.2)

## 2020-05-21 LAB — LACTIC ACID, PLASMA: Lactic Acid, Venous: 1.2 mmol/L (ref 0.5–1.9)

## 2020-05-21 LAB — LIPASE, BLOOD: Lipase: 47 U/L (ref 11–51)

## 2020-05-21 LAB — RESP PANEL BY RT-PCR (FLU A&B, COVID) ARPGX2
Influenza A by PCR: NEGATIVE
Influenza B by PCR: NEGATIVE
SARS Coronavirus 2 by RT PCR: NEGATIVE

## 2020-05-21 LAB — HCG, QUANTITATIVE, PREGNANCY: hCG, Beta Chain, Quant, S: 1 m[IU]/mL (ref <5)

## 2020-05-21 MED ORDER — HYDROMORPHONE HCL 1 MG/ML IJ SOLN
0.5000 mg | Freq: Once | INTRAMUSCULAR | Status: AC
  Administered 2020-05-21: 0.5 mg via INTRAVENOUS
  Filled 2020-05-21: qty 1

## 2020-05-21 MED ORDER — FENTANYL CITRATE (PF) 100 MCG/2ML IJ SOLN
100.0000 ug | Freq: Once | INTRAMUSCULAR | Status: AC
  Administered 2020-05-21: 100 ug via INTRAVENOUS
  Filled 2020-05-21: qty 2

## 2020-05-21 MED ORDER — SODIUM CHLORIDE 0.9 % IV BOLUS
1000.0000 mL | Freq: Once | INTRAVENOUS | Status: AC
  Administered 2020-05-21: 1000 mL via INTRAVENOUS

## 2020-05-21 MED ORDER — HYDROMORPHONE HCL 2 MG/ML IJ SOLN
2.0000 mg | Freq: Once | INTRAMUSCULAR | Status: AC
Start: 2020-05-21 — End: 2020-05-21
  Administered 2020-05-21: 2 mg via INTRAVENOUS
  Filled 2020-05-21: qty 1

## 2020-05-21 MED ORDER — MORPHINE SULFATE (PF) 4 MG/ML IV SOLN
4.0000 mg | Freq: Once | INTRAVENOUS | Status: AC
  Administered 2020-05-21: 4 mg via INTRAVENOUS
  Filled 2020-05-21: qty 1

## 2020-05-21 MED ORDER — IOHEXOL 9 MG/ML PO SOLN
ORAL | Status: AC
  Filled 2020-05-21: qty 1000

## 2020-05-21 MED ORDER — IOHEXOL 9 MG/ML PO SOLN: 1000.0000 mL | ORAL | Status: AC

## 2020-05-21 NOTE — ED Provider Notes (Signed)
[facility]-EMERGENCY DEPT Provider Note   CSN: [id number] Arrival date & time: 05/21/20  1915     History Chief Complaint  Patient presents with  . Abdominal Pain    Jordan Chung is a 30 y.o. female.  Cholecystectomy.  Patient presents with chief plaint of abdominal pain.  Patient reports that abdominal pain is located to her right upper quadrant however radiates throughout her abdomen.  Patient rates her pain 9/10 on the pain scale.  Pain began this evening around 1800, pain actually worsened over 30 minutes.  Pain has been constant since starting.  Alleviating or aggravating factors.  Patient reports that she took oxycodone and Robaxin at 1500.    Patient denies any fevers, chills, nausea, vomiting, constipation, diarrhea, abdominal distention, urinary symptoms, vaginal bleeding, vaginal discharge, vaginal pain.  Patient has no change in drain output since placement.  Patient reports 45-13mL of output daily.  Patient describes out put as yellow.  Patient also reports that she is 8 weeks postpartum.  G2 P2 002.  Both vaginal deliveries.    Per chart review patient was admitted to hospital on 5/2 for symptomatic cholelithiasis, patient had gallbladder removed by Dr. Magnus Ivan on 05/09/2020.  Patient was seen in the emergency department on 5/4 for postoperative pain.  Patient was seen again in emergency department on 5/6 for postoperative pain.  Patient was admitted at this time due to postoperative bile leak.  Patient had guided biloma drain placed (5/7 by Dr. Elby Showers) as well as ERCP with sphincterotomy and stent placement (5/8 Dr. [name])  patient was discharged on 5/10 with plan to follow-up with [facility] in 2 weeks as well as with IR and GI.    HPI     Past Medical History:  Diagnosis Date  . Hyperemesis gravidarum   . Postpartum depression     Patient Active Problem List   Diagnosis Date Noted  . Post-op pain 05/13/2020    Past Surgical  History:  Procedure Laterality Date  . BILIARY STENT PLACEMENT N/A 05/15/2020   Procedure: BILIARY STENT PLACEMENT;  Surgeon: Kerin Salen, MD;  Location: [facility];  Service: Gastroenterology;  Laterality: N/A;  . CHOLECYSTECTOMY N/A 05/09/2020   Procedure: LAPAROSCOPIC CHOLECYSTECTOMY WITH INTRAOPERATIVE CHOLANGIOGRAM;  Surgeon: Abigail Miyamoto, MD;  Location: [facility] ORS;  Service: General;  Laterality: N/A;  . ENDOSCOPIC RETROGRADE CHOLANGIOPANCREATOGRAPHY (ERCP) WITH PROPOFOL N/A 05/15/2020   Procedure: ENDOSCOPIC RETROGRADE CHOLANGIOPANCREATOGRAPHY (ERCP) WITH PROPOFOL;  Surgeon: Kerin Salen, MD;  Location: [facility];  Service: Gastroenterology;  Laterality: N/A;  . PANCREATIC STENT PLACEMENT  05/15/2020   Procedure: PANCREATIC STENT PLACEMENT;  Surgeon: Kerin Salen, MD;  Location: [facility];  Service: Gastroenterology;;  . [name]  05/15/2020   Procedure: [name];  Surgeon: Kerin Salen, MD;  Location: [facility];  Service: Gastroenterology;;     OB History    Gravida  2   Para  1   Term  1   Preterm  0   AB  0   Living  1     SAB  0   IAB  0   Ectopic  0   Multiple  0   Live Births  1           No family history on file.  Social History   Tobacco Use  . Smoking status: Never Smoker  . Smokeless tobacco: Never Used  Substance Use Topics  . Alcohol use: Never  . Drug use: Never    Home Medications Prior to  Admission medications   Medication Sig Start Date End Date Taking? Authorizing Provider  acetaminophen (TYLENOL) 325 MG tablet Take 2 tablets (650 mg total) by mouth every 6 (six) hours as needed for mild pain (or temp > 100). 05/17/20   Juliet RudeJohnson, Kelly R, PA-C  docusate sodium (COLACE) 100 MG capsule Take 1 capsule (100 mg total) by mouth 2 (two) times daily. Patient taking differently: Take 100 mg by mouth daily as needed for mild constipation. 12/05/19 12/04/20  Rasch, Victorino DikeJennifer I, NP  methocarbamol (ROBAXIN) 500 MG tablet Take 1 tablet  (500 mg total) by mouth every 6 (six) hours as needed for muscle spasms. 05/17/20   Juliet RudeJohnson, Kelly R, PA-C  ondansetron (ZOFRAN) 4 MG tablet Take 1 tablet (4 mg total) by mouth every 6 (six) hours. 05/11/20   Curatolo, Adam, DO  oxyCODONE (OXY IR/ROXICODONE) 5 MG immediate release tablet Take 1-2 tablets (5-10 mg total) by mouth every 6 (six) hours as needed for moderate pain or severe pain. 05/17/20   Juliet RudeJohnson, Kelly R, PA-C  polyethylene glycol (MIRALAX / GLYCOLAX) 17 g packet Take 17 g by mouth daily as needed for mild constipation.    [provider]  Prenatal Vit-Fe Fumarate-FA (MULTIVITAMIN-PRENATAL) 27-0.8 MG TABS tablet Take 1 tablet by mouth daily.    [provider]  sodium chloride flush (NS) 0.9 % SOLN 5 mLs by Intracatheter route every 8 (eight) hours. 05/17/20   Juliet RudeJohnson, Kelly R, PA-C  traMADol (ULTRAM) 50 MG tablet Take 1 tablet (50 mg total) by mouth every 6 (six) hours as needed for moderate pain or severe pain. 05/17/20   Juliet RudeJohnson, Kelly R, PA-C  dicyclomine (BENTYL) 20 MG tablet Take 1 tablet (20 mg total) by mouth 2 (two) times daily. 03/27/19 10/06/19  Lorelee NewGreen, Garrett L, PA-C    Allergies    Patient has no known allergies.  Review of Systems   Review of Systems  Constitutional: Negative for chills and fever.  Eyes: Negative for visual disturbance.  Respiratory: Negative for shortness of breath.   Cardiovascular: Negative for chest pain.  Gastrointestinal: Positive for abdominal pain. Negative for abdominal distention, anal bleeding, blood in stool, constipation, diarrhea, nausea, rectal pain and vomiting.  Genitourinary: Negative for difficulty urinating, dysuria, hematuria, vaginal bleeding, vaginal discharge and vaginal pain.  Musculoskeletal: Negative for back pain, neck pain and neck stiffness.  Skin: Negative for color change and rash.  Neurological: Negative for dizziness, syncope, light-headedness and headaches.  Psychiatric/Behavioral: Negative for  confusion.    Physical Exam Updated Vital Signs BP (!) 98/57   Pulse 75   Temp 99.3 F (37.4 C) (Rectal)   Resp 16   Ht 5\' 8"  (1.727 m)   Wt 103.9 kg   LMP 07/23/2019 (Approximate) Comment: Gravida 2, Para 1  SpO2 98%   BMI 34.83 kg/m   Physical Exam Vitals and nursing note reviewed.  Constitutional:      General: She is not in acute distress.    Appearance: She is ill-appearing. She is not toxic-appearing or diaphoretic.     Comments: Uncomfortable due to complaints of abdominal pain Clammy  HENT:     Head: Normocephalic.  Eyes:     General: No scleral icterus.       Right eye: No discharge.        Left eye: No discharge.  Cardiovascular:     Rate and Rhythm: Normal rate.     Heart sounds: Normal heart sounds.  Pulmonary:     Effort: Pulmonary effort  is normal. No tachypnea, bradypnea or respiratory distress.     Breath sounds: Normal breath sounds.  Abdominal:     General: Bowel sounds are normal. There is no distension. There are no signs of injury.     Palpations: Abdomen is soft. There is no mass or pulsatile mass.     Tenderness: There is generalized abdominal tenderness. There is no guarding or rebound.     Hernia: There is no hernia in the umbilical area or ventral area.       Comments: Percutaneous drain noted to right upper quadrant, no erythema or purulent material surrounding drain  Surgical incisions from laparoscopic surgery; intact, dry, without erythema or purulent discharge  Tenderness throughout abdomen however worse in right upper and lower quadrant  Musculoskeletal:     Cervical back: Neck supple.  Skin:    General: Skin is warm and dry.     Coloration: Skin is not cyanotic or jaundiced.  Neurological:     General: No focal deficit present.     Mental Status: She is alert.  Psychiatric:        Behavior: Behavior is cooperative.     ED Results / Procedures / Treatments   Labs (all labs ordered are listed, but only abnormal results are  displayed) Labs Reviewed  CBC WITH DIFFERENTIAL/PLATELET - Abnormal; Notable for the following components:      Result Value   WBC 19.1 (*)    Hemoglobin 11.4 (*)    MCH 24.3 (*)    RDW 17.8 (*)    Neutro Abs 14.8 (*)    Abs Immature Granulocytes 0.09 (*)    All other components within normal limits  COMPREHENSIVE METABOLIC PANEL - Abnormal; Notable for the following components:   CO2 21 (*)    Calcium 8.8 (*)    Total Bilirubin 1.3 (*)    All other components within normal limits  LIPASE, BLOOD  HCG, QUANTITATIVE, PREGNANCY  LACTIC ACID, PLASMA  LACTIC ACID, PLASMA    EKG None  Radiology DG Abdomen 1 View  Result Date: 05/21/2020 CLINICAL DATA:  Abdominal pain. Cholecystectomy 12 days ago, recent biliary stent placement. EXAM: ABDOMEN - 1 VIEW COMPARISON:  CT 05/13/2020 FINDINGS: Portable supine views of the abdomen obtained. Right upper quadrant biliary stent in place. Right upper quadrant drainage catheter in place. Surgical clips from cholecystectomy. No bowel dilatation or evidence of obstruction. Small to moderate colonic stool burden. Intrarenal calculi on recent CT are partially obscured by overlying stool. C shaped 2 cm small bore catheter projects over the left sacrum, unclear if this is external to the patient. Low lung volumes without significant pleural effusion or focal airspace disease. IMPRESSION: 1. Right upper quadrant biliary stent in place. Right upper quadrant drainage catheter in place. 2. Small bore C-shaped density projecting over the left pelvis is of unknown etiology, may be external to the patient. 3. Normal bowel gas pattern. Electronically Signed   By: Narda Rutherford M.D.   On: 05/21/2020 20:28    Procedures Procedures   Medications Ordered in ED Medications  morphine 4 MG/ML injection 4 mg (has no administration in time range)  HYDROmorphone (DILAUDID) injection 2 mg (2 mg Intravenous Given 05/21/20 1959)  sodium chloride 0.9 % bolus 1,000 mL  (1,000 mLs Intravenous New Bag/Given 05/21/20 2035)  iohexol (OMNIPAQUE) 9 MG/ML oral solution 1,000 mL ( Oral Contrast Given 05/21/20 2127)    ED Course  I have reviewed the triage vital signs and the nursing notes.  Pertinent labs & imaging results that were available during my care of the patient were reviewed by me and considered in my medical decision making (see chart for details).    MDM Rules/Calculators/A&P                          Well-appearing 30 year old female who appears uncomfortable due to complaints of abdominal pain.  Patient presents with chief complaint of abdominal pain.  Per chart review patient had cholecystectomy on 05/09/20 formed by Dr. Magnus Ivan.  Patient was found to have postoperative bile leak.  Patient had guided biloma drain placed (5/7 by Dr. Elby Showers) as well as ERCP with sphincterotomy and stent placement (5/8 Dr. [name]).  Patient was discharged from the hospital on 05/17/20.  Patient's pain has been well managed until today at approximately 1800 when she had onset of severe right upper quadrant abdominal pain over 30 minutes.  Patient took her prescribed oxycodone and Robaxin at 1500.  Patient denies any fevers, chills, nausea, vomiting, abdominal distention.  On physical exam abdomen soft, nondistended, generalized tenderness throughout abdomen.  Increased tenderness to right upper and lower quadrant.  No mass, pulsatile mass.  Yellow drainage noted and tube involved.  Will give patient pain management with Dilaudid.  Will order CBC, CMP, lipase, pregnancy test.  Will obtain KUB.  KUB shows right upper quadrant biliary stent in place, small bore C-shaped density projecting over the left pelvis of unknown etiology, may be external to the patient.  hCG quantitative pregnancy test 1. CMP shows total bili slightly elevated at 1.3, bicarb slightly decreased at 22 Lipase within normal limits. CBC shows leukocytosis at 19.4.    Concern for possible intra-abdominal  infection.  We will add lactic acid as well as CT scan of abdomen pelvis.  Plan to consult surgery pending results of CT scan.   On serial repeat examination abdomen remains soft, nondistended, tender throughout.  Patient continues to complain of pain after receiving 2 mg Dilaudid.  With the patient and 4 mg of morphine.  Patient care transferred to PA Gieple at the end of my shift. Patient presentation, ED course, and plan of care discussed with review of all pertinent labs and imaging. Please see his/her note for further details regarding further ED course and disposition.    Final Clinical Impression(s) / ED Diagnoses Final diagnoses:  None    Rx / DC Orders ED Discharge Orders    None       Berneice Heinrich 05/21/20 2159    Lorre Nick, MD 05/22/20 1358

## 2020-05-21 NOTE — ED Provider Notes (Signed)
11:23 PM Signout from [employer] at shift change.   Pt with recent cholecystectomy, complicated by bile leak, subsequent drain and ERCP with biliary stent placement. Worsening pain tonight. Elevated WBC count. Normal lactate and afebrile. Normal LFTs and lipase. Awaiting CT.   I went to see patient. She was ambulating back to room from restroom, slowly. Holding right side. CT arrived and patient taken directly to CT scanner.   BP 117/71   Pulse 71   Temp 99.3 F (37.4 C) (Rectal)   Resp 15   Ht 5\' 8"  (1.727 m)   Wt 103.9 kg   LMP 07/23/2019 (Approximate) Comment: Gravida 2, Para 1  SpO2 100%   BMI 34.83 kg/m   11:36 PM Additional pain medication after CT due to severe pain.  Pt reports 45-60cc/day clear brown output from drain since hospital d/c. Drain appears to be unchanged in her position per her report.   On exam, patient appears uncomfortable.  Tenderness in the epigastrium, right upper quadrant, right lower quadrant.  Pending results of CT scan.  BP 121/63   Pulse 72   Temp 99.3 F (37.4 C) (Rectal)   Resp 15   Ht 5\' 8"  (1.727 m)   Wt 103.9 kg   LMP 07/23/2019 (Approximate) Comment: Gravida 2, Para 1  SpO2 100%   BMI 34.83 kg/m     , PA-C 05/21/20 2355    Renne Crigler, MD 05/24/20 [phone]

## 2020-05-21 NOTE — ED Notes (Signed)
Pt went to CT ambulatory and was visibly in tremendous pain when she returned.  Medicated for this pain which decreased it but pt still appears uncomfortable, family is supportive at the bedside, pt notified to please call if pain continues.

## 2020-05-21 NOTE — ED Notes (Signed)
Patient transported to CT 

## 2020-05-21 NOTE — ED Provider Notes (Signed)
Care assumed from [facility] .  Please see [facility] full H&P and [facility] care transfer note.  In short,  Jordan Chung is a 30 y.o. female presents for acute post op abdominal pain.  Pt with hx of cholecystectomy on 5/2 by Dr. Magnus Ivan complicated by biliary leak on 5/6 for which she had drain placed by Dr. [name] of GI on 5/8. She was discharged on 5/10 with planned 2 week follow up. Patient is 8 weeks postpartum.   Tonight pt with acute worsening of pain at 6pm; Initial work-up noted elevated WBC and normal lactate.  Drain output unchanged at 45-60cc. Difficulty controlling pain in the ED.   Physical Exam  BP 121/63   Pulse 72   Temp 99.3 F (37.4 C) (Rectal)   Resp 15   Ht 5\' 8"  (1.727 m)   Wt 103.9 kg   LMP 07/23/2019 (Approximate) Comment: Gravida 2, Para 1  SpO2 100%   BMI 34.83 kg/m   Physical Exam Vitals and nursing note reviewed.  Constitutional:      General: She is not in acute distress.    Appearance: She is well-developed.     Comments: Patient writhing in pain.  HENT:     Head: Normocephalic.  Eyes:     General: No scleral icterus.    Conjunctiva/sclera: Conjunctivae normal.  Cardiovascular:     Rate and Rhythm: Normal rate.  Pulmonary:     Effort: Pulmonary effort is normal.  Abdominal:     Comments: Abdomen soft.  Pain in the right upper quadrant  Musculoskeletal:        General: Normal range of motion.     Cervical back: Normal range of motion.  Skin:    General: Skin is warm and dry.  Neurological:     Mental Status: She is alert.     ED Course/Procedures     Procedures   Results for orders placed or performed during the hospital encounter of 05/21/20  Resp Panel by RT-PCR (Flu A&B, Covid) Nasopharyngeal Swab   Specimen: Nasopharyngeal Swab; Nasopharyngeal(NP) swabs in vial transport medium  Result Value Ref Range   SARS Coronavirus 2 by RT PCR NEGATIVE NEGATIVE   Influenza A by PCR NEGATIVE NEGATIVE   Influenza B by PCR  NEGATIVE NEGATIVE  CBC with Differential  Result Value Ref Range   WBC 19.1 (H) 4.0 - 10.5 K/uL   RBC 4.70 3.87 - 5.11 MIL/uL   Hemoglobin 11.4 (L) 12.0 - 15.0 g/dL   HCT 05/23/20 60.4 - 54.0 %   MCV 80.4 80.0 - 100.0 fL   MCH 24.3 (L) 26.0 - 34.0 pg   MCHC 30.2 30.0 - 36.0 g/dL   RDW 98.1 (H) 19.1 - 47.8 %   Platelets 350 150 - 400 K/uL   nRBC 0.0 0.0 - 0.2 %   Neutrophils Relative % 77 %   Neutro Abs 14.8 (H) 1.7 - 7.7 K/uL   Lymphocytes Relative 17 %   Lymphs Abs 3.3 0.7 - 4.0 K/uL   Monocytes Relative 4 %   Monocytes Absolute 0.8 0.1 - 1.0 K/uL   Eosinophils Relative 1 %   Eosinophils Absolute 0.2 0.0 - 0.5 K/uL   Basophils Relative 0 %   Basophils Absolute 0.0 0.0 - 0.1 K/uL   Immature Granulocytes 1 %   Abs Immature Granulocytes 0.09 (H) 0.00 - 0.07 K/uL  Comprehensive metabolic panel  Result Value Ref Range   Sodium 139 135 - 145 mmol/L   Potassium 4.9  3.5 - 5.1 mmol/L   Chloride 108 98 - 111 mmol/L   CO2 21 (L) 22 - 32 mmol/L   Glucose, Bld 78 70 - 99 mg/dL   BUN 13 6 - 20 mg/dL   Creatinine, Ser 9.56 0.44 - 1.00 mg/dL   Calcium 8.8 (L) 8.9 - 10.3 mg/dL   Total Protein 6.8 6.5 - 8.1 g/dL   Albumin 3.7 3.5 - 5.0 g/dL   AST 30 15 - 41 U/L   ALT 19 0 - 44 U/L   Alkaline Phosphatase 80 38 - 126 U/L   Total Bilirubin 1.3 (H) 0.3 - 1.2 mg/dL   GFR, Estimated >21 >30 mL/min   Anion gap 10 5 - 15  Lipase, blood  Result Value Ref Range   Lipase 47 11 - 51 U/L  hCG, quantitative, pregnancy  Result Value Ref Range   hCG, Beta Chain, Quant, S 1 <5 mIU/mL  Lactic acid, plasma  Result Value Ref Range   Lactic Acid, Venous 1.2 0.5 - 1.9 mmol/L   CT Abdomen Pelvis Wo Contrast  Result Date: 05/21/2020 CLINICAL DATA:  Abdominal pain and fever. Postop. Recent cholecystectomy complicated by bile leak with biliary stenting and percutaneous drainage. EXAM: CT ABDOMEN AND PELVIS WITHOUT CONTRAST TECHNIQUE: Multidetector CT imaging of the abdomen and pelvis was performed following  the standard protocol without IV contrast. COMPARISON:  Most recent CT 05/13/2020 FINDINGS: Lower chest: Mild lower lobe atelectasis.  No pleural fluid. Hepatobiliary: Biliary stent in place decompressing the biliary tree. No intra or extrahepatic biliary ductal dilatation. Drainage catheter in the gallbladder fossa with resolution of previous fluid collection. No inflammation along the course of the catheter. No new hepatic abnormality. Pancreas: Faint peripancreatic stranding about the pancreatic head, series 2, image 34. No ductal dilatation. No acute pancreatic collection. Spleen: Normal in size without focal abnormality. Adrenals/Urinary Tract: Normal adrenal glands. Bilateral intrarenal calculi without hydronephrosis or perinephric edema. No ureteral stones. No perinephric edema. Urinary bladder is minimally distended. Stomach/Bowel: Patulous distal esophagus. Unremarkable stomach. Normal positioning of the duodenum and ligament of Treitz. Normal small bowel without obstruction or inflammation. Normal appendix. Moderate colonic stool burden without colonic wall thickening or inflammation. Vascular/Lymphatic: Normal caliber abdominal aorta. Mild aortic atherosclerosis, advanced for age. No enlarged lymph nodes in the abdomen or pelvis. Reproductive: Uterus and bilateral adnexa are unremarkable. Other: Fluid collection in the gallbladder fossa has resolved after percutaneous drainage. No new intra-abdominal collection. No free fluid. No free air. Postsurgical change of the anterior abdominal wall without subcutaneous collection. Musculoskeletal: There are no acute or suspicious osseous abnormalities. IMPRESSION: 1. Percutaneous drainage catheter in the gallbladder fossa with resolved fluid collection. 2. Biliary stent in place decompressing the biliary tree. No intra or extrahepatic biliary ductal dilatation. 3. Faint peripancreatic stranding about the pancreatic head, can be seen with acute pancreatitis.  Recommend correlation with pancreatic enzymes. 4. Bilateral nonobstructing nephrolithiasis. 5. Mild aortic atherosclerosis is age advanced. Aortic Atherosclerosis (ICD10-I70.0). Electronically Signed   By: Narda Rutherford M.D.   On: 05/21/2020 23:46   DG Abdomen 1 View  Result Date: 05/21/2020 CLINICAL DATA:  Abdominal pain. Cholecystectomy 12 days ago, recent biliary stent placement. EXAM: ABDOMEN - 1 VIEW COMPARISON:  CT 05/13/2020 FINDINGS: Portable supine views of the abdomen obtained. Right upper quadrant biliary stent in place. Right upper quadrant drainage catheter in place. Surgical clips from cholecystectomy. No bowel dilatation or evidence of obstruction. Small to moderate colonic stool burden. Intrarenal calculi on recent CT are partially obscured by  overlying stool. C shaped 2 cm small bore catheter projects over the left sacrum, unclear if this is external to the patient. Low lung volumes without significant pleural effusion or focal airspace disease. IMPRESSION: 1. Right upper quadrant biliary stent in place. Right upper quadrant drainage catheter in place. 2. Small bore C-shaped density projecting over the left pelvis is of unknown etiology, may be external to the patient. 3. Normal bowel gas pattern. Electronically Signed   By: Narda Rutherford M.D.   On: 05/21/2020 20:28     MDM   Plan: CT and surgery consult.  Possible admission for pain control.   12:35 AM CT scan shows percutaneous drainage catheter in the gallbladder fossa with resolved fluid collection biliary stent which continues to decompress the biliary tree but peripancreatic stranding about the pancreatic head.  Concern for acute pancreatitis.  Patient with severe nausea and pain.  Pain uncontrolled with multiple bouts of fentanyl and Dilaudid.  Suspect she will need admission for pain control.  Will discuss CT findings with general surgery.  12:47 AM Discussed with Dr. Carolynne Edouard.  He feels this post-op pancreatitis is more of  a medical issue - recommends bowel rest and pain control.  Will admit to medicine with surgery consult and evaluation in the morning.  1:06 AM Discussed patient's case with hospitalist, Dr. Toniann Fail.  I have recommended admission and patient (and family if present) agree with this plan. Admitting physician will place admission orders.   Other acute pancreatitis, unspecified complication status     Faiza Bansal, Boyd Kerbs 05/22/20 0107    Nira Conn, MD 05/23/20 2104

## 2020-05-21 NOTE — ED Triage Notes (Addendum)
Pt to ED by EMS from home with c/o abdominal pain. Pt had her gallbladder removed 5/2 here at [facility], then had to have biliary stents 5/8, pt is also 6 weeks post partum. Pt endorses severe pain which is getting worse and spreading downward. Arrives A+O, VSS, NADN. Pt has JP drain that was placed 5/7.

## 2020-05-22 ENCOUNTER — Inpatient Hospital Stay (HOSPITAL_COMMUNITY): Payer: Medicaid Other

## 2020-05-22 ENCOUNTER — Encounter (HOSPITAL_COMMUNITY): Payer: Self-pay | Admitting: Internal Medicine

## 2020-05-22 DIAGNOSIS — D649 Anemia, unspecified: Secondary | ICD-10-CM | POA: Diagnosis not present

## 2020-05-22 DIAGNOSIS — K9189 Other postprocedural complications and disorders of digestive system: Secondary | ICD-10-CM | POA: Diagnosis present

## 2020-05-22 DIAGNOSIS — R1013 Epigastric pain: Secondary | ICD-10-CM

## 2020-05-22 DIAGNOSIS — K858 Other acute pancreatitis without necrosis or infection: Secondary | ICD-10-CM

## 2020-05-22 DIAGNOSIS — Z9049 Acquired absence of other specified parts of digestive tract: Secondary | ICD-10-CM

## 2020-05-22 DIAGNOSIS — D72829 Elevated white blood cell count, unspecified: Secondary | ICD-10-CM | POA: Diagnosis not present

## 2020-05-22 DIAGNOSIS — K859 Acute pancreatitis without necrosis or infection, unspecified: Secondary | ICD-10-CM

## 2020-05-22 DIAGNOSIS — R109 Unspecified abdominal pain: Secondary | ICD-10-CM | POA: Diagnosis present

## 2020-05-22 DIAGNOSIS — J9811 Atelectasis: Secondary | ICD-10-CM | POA: Diagnosis present

## 2020-05-22 DIAGNOSIS — R197 Diarrhea, unspecified: Secondary | ICD-10-CM | POA: Diagnosis not present

## 2020-05-22 DIAGNOSIS — Z20822 Contact with and (suspected) exposure to covid-19: Secondary | ICD-10-CM | POA: Diagnosis present

## 2020-05-22 DIAGNOSIS — Y838 Other surgical procedures as the cause of abnormal reaction of the patient, or of later complication, without mention of misadventure at the time of the procedure: Secondary | ICD-10-CM | POA: Diagnosis present

## 2020-05-22 DIAGNOSIS — R0602 Shortness of breath: Secondary | ICD-10-CM

## 2020-05-22 DIAGNOSIS — K839 Disease of biliary tract, unspecified: Secondary | ICD-10-CM | POA: Diagnosis not present

## 2020-05-22 DIAGNOSIS — Z79899 Other long term (current) drug therapy: Secondary | ICD-10-CM | POA: Diagnosis not present

## 2020-05-22 DIAGNOSIS — D509 Iron deficiency anemia, unspecified: Secondary | ICD-10-CM | POA: Diagnosis present

## 2020-05-22 DIAGNOSIS — I700 Atherosclerosis of aorta: Secondary | ICD-10-CM | POA: Diagnosis present

## 2020-05-22 LAB — CBC WITH DIFFERENTIAL/PLATELET
Abs Immature Granulocytes: 0.05 10*3/uL (ref 0.00–0.07)
Basophils Absolute: 0 10*3/uL (ref 0.0–0.1)
Basophils Relative: 0 %
Eosinophils Absolute: 0.1 10*3/uL (ref 0.0–0.5)
Eosinophils Relative: 1 %
HCT: 32.4 % — ABNORMAL LOW (ref 36.0–46.0)
Hemoglobin: 9.7 g/dL — ABNORMAL LOW (ref 12.0–15.0)
Immature Granulocytes: 0 %
Lymphocytes Relative: 20 %
Lymphs Abs: 2.8 10*3/uL (ref 0.7–4.0)
MCH: 24.3 pg — ABNORMAL LOW (ref 26.0–34.0)
MCHC: 29.9 g/dL — ABNORMAL LOW (ref 30.0–36.0)
MCV: 81 fL (ref 80.0–100.0)
Monocytes Absolute: 0.8 10*3/uL (ref 0.1–1.0)
Monocytes Relative: 6 %
Neutro Abs: 10.1 10*3/uL — ABNORMAL HIGH (ref 1.7–7.7)
Neutrophils Relative %: 73 %
Platelets: 256 10*3/uL (ref 150–400)
RBC: 4 MIL/uL (ref 3.87–5.11)
RDW: 17.8 % — ABNORMAL HIGH (ref 11.5–15.5)
WBC: 13.9 10*3/uL — ABNORMAL HIGH (ref 4.0–10.5)
nRBC: 0 % (ref 0.0–0.2)

## 2020-05-22 LAB — URINALYSIS, ROUTINE W REFLEX MICROSCOPIC
Bilirubin Urine: NEGATIVE
Glucose, UA: NEGATIVE mg/dL
Hgb urine dipstick: NEGATIVE
Ketones, ur: NEGATIVE mg/dL
Leukocytes,Ua: NEGATIVE
Nitrite: NEGATIVE
Protein, ur: NEGATIVE mg/dL
Specific Gravity, Urine: 1.009 (ref 1.005–1.030)
pH: 6 (ref 5.0–8.0)

## 2020-05-22 LAB — LIPASE, BLOOD: Lipase: 21 U/L (ref 11–51)

## 2020-05-22 LAB — COMPREHENSIVE METABOLIC PANEL
ALT: 16 U/L (ref 0–44)
AST: 12 U/L — ABNORMAL LOW (ref 15–41)
Albumin: 3.2 g/dL — ABNORMAL LOW (ref 3.5–5.0)
Alkaline Phosphatase: 74 U/L (ref 38–126)
Anion gap: 3 — ABNORMAL LOW (ref 5–15)
BUN: 9 mg/dL (ref 6–20)
CO2: 25 mmol/L (ref 22–32)
Calcium: 8.1 mg/dL — ABNORMAL LOW (ref 8.9–10.3)
Chloride: 108 mmol/L (ref 98–111)
Creatinine, Ser: 0.63 mg/dL (ref 0.44–1.00)
GFR, Estimated: >60 mL/min (ref >60)
Glucose, Bld: 110 mg/dL — ABNORMAL HIGH (ref 70–99)
Potassium: 3.6 mmol/L (ref 3.5–5.1)
Sodium: 136 mmol/L (ref 135–145)
Total Bilirubin: 0.7 mg/dL (ref 0.3–1.2)
Total Protein: 5.9 g/dL — ABNORMAL LOW (ref 6.5–8.1)

## 2020-05-22 LAB — LACTIC ACID, PLASMA
Lactic Acid, Venous: 0.6 mmol/L (ref 0.5–1.9)
Lactic Acid, Venous: 1 mmol/L (ref 0.5–1.9)

## 2020-05-22 LAB — GLUCOSE, CAPILLARY: Glucose-Capillary: 105 mg/dL — ABNORMAL HIGH (ref 70–99)

## 2020-05-22 LAB — TYPE AND SCREEN
ABO/RH(D): O POS
Antibody Screen: NEGATIVE

## 2020-05-22 LAB — TROPONIN I (HIGH SENSITIVITY): Troponin I (High Sensitivity): 3 ng/L (ref <18)

## 2020-05-22 LAB — MAGNESIUM: Magnesium: 1.9 mg/dL (ref 1.7–2.4)

## 2020-05-22 LAB — ABO/RH: ABO/RH(D): O POS

## 2020-05-22 MED ORDER — KETOROLAC TROMETHAMINE 30 MG/ML IJ SOLN
30.0000 mg | Freq: Four times a day (QID) | INTRAMUSCULAR | Status: DC | PRN
  Administered 2020-05-22 – 2020-05-23 (×4): 30 mg via INTRAVENOUS
  Filled 2020-05-22 (×4): qty 1

## 2020-05-22 MED ORDER — BISACODYL 10 MG RE SUPP
10.0000 mg | Freq: Once | RECTAL | Status: AC
  Administered 2020-05-22: 10 mg via RECTAL
  Filled 2020-05-22: qty 1

## 2020-05-22 MED ORDER — HYDROMORPHONE HCL 1 MG/ML IJ SOLN
1.0000 mg | Freq: Once | INTRAMUSCULAR | Status: AC
Start: 2020-05-22 — End: 2020-05-22
  Administered 2020-05-22: 1 mg via INTRAVENOUS
  Filled 2020-05-22: qty 1

## 2020-05-22 MED ORDER — SODIUM CHLORIDE 0.9% FLUSH: 9.0000 mL | INTRAVENOUS | Status: DC | PRN

## 2020-05-22 MED ORDER — LACTATED RINGERS IV SOLN: INTRAVENOUS | Status: DC

## 2020-05-22 MED ORDER — ONDANSETRON HCL 4 MG/2ML IJ SOLN
4.0000 mg | Freq: Once | INTRAMUSCULAR | Status: AC
  Administered 2020-05-22: 4 mg via INTRAVENOUS
  Filled 2020-05-22: qty 2

## 2020-05-22 MED ORDER — LACTATED RINGERS IV SOLN
INTRAVENOUS | Status: AC
  Administered 2020-05-22: 200 mL via INTRAVENOUS

## 2020-05-22 MED ORDER — HYDROMORPHONE HCL 1 MG/ML IJ SOLN
1.0000 mg | Freq: Once | INTRAMUSCULAR | Status: AC
  Administered 2020-05-22: 1 mg via INTRAVENOUS
  Filled 2020-05-22: qty 1

## 2020-05-22 MED ORDER — POTASSIUM CHLORIDE 10 MEQ/100ML IV SOLN
10.0000 meq | INTRAVENOUS | Status: AC
  Administered 2020-05-22 (×4): 10 meq via INTRAVENOUS
  Filled 2020-05-22: qty 100

## 2020-05-22 MED ORDER — HYDROMORPHONE HCL 2 MG/ML IJ SOLN
2.0000 mg | INTRAMUSCULAR | Status: DC | PRN
  Administered 2020-05-22 (×2): 2 mg via INTRAVENOUS
  Filled 2020-05-22 (×3): qty 1

## 2020-05-22 MED ORDER — FENTANYL CITRATE (PF) 100 MCG/2ML IJ SOLN
100.0000 ug | Freq: Once | INTRAMUSCULAR | Status: AC
  Administered 2020-05-22: 100 ug via INTRAVENOUS
  Filled 2020-05-22: qty 2

## 2020-05-22 MED ORDER — ONDANSETRON HCL 4 MG/2ML IJ SOLN
4.0000 mg | Freq: Four times a day (QID) | INTRAMUSCULAR | Status: DC | PRN
  Administered 2020-05-22: 4 mg via INTRAVENOUS
  Filled 2020-05-22: qty 2

## 2020-05-22 MED ORDER — HYDROMORPHONE HCL 1 MG/ML IJ SOLN
1.0000 mg | INTRAMUSCULAR | Status: DC | PRN
  Administered 2020-05-22: 1 mg via INTRAVENOUS
  Filled 2020-05-22: qty 1

## 2020-05-22 MED ORDER — PROCHLORPERAZINE EDISYLATE 10 MG/2ML IJ SOLN
10.0000 mg | Freq: Four times a day (QID) | INTRAMUSCULAR | Status: DC | PRN
  Administered 2020-05-22: 10 mg via INTRAVENOUS
  Filled 2020-05-22: qty 2

## 2020-05-22 MED ORDER — METHOCARBAMOL 1000 MG/10ML IJ SOLN
500.0000 mg | Freq: Once | INTRAVENOUS | Status: AC
  Administered 2020-05-22: 500 mg via INTRAVENOUS
  Filled 2020-05-22: qty 500

## 2020-05-22 MED ORDER — TECHNETIUM TC 99M MEBROFENIN IV KIT
5.4000 | PACK | Freq: Once | INTRAVENOUS | Status: AC | PRN
  Administered 2020-05-22: 5.4 via INTRAVENOUS

## 2020-05-22 MED ORDER — HYDROMORPHONE 1 MG/ML IV SOLN: INTRAVENOUS | Status: DC

## 2020-05-22 MED ORDER — ENOXAPARIN SODIUM 40 MG/0.4ML IJ SOSY
40.0000 mg | PREFILLED_SYRINGE | INTRAMUSCULAR | Status: DC
  Administered 2020-05-22 – 2020-05-23 (×2): 40 mg via SUBCUTANEOUS
  Filled 2020-05-22 (×2): qty 0.4

## 2020-05-22 MED ORDER — HYDROMORPHONE HCL 2 MG/ML IJ SOLN
2.0000 mg | INTRAMUSCULAR | Status: DC | PRN
  Administered 2020-05-22 (×2): 2 mg via INTRAVENOUS
  Filled 2020-05-22 (×2): qty 1

## 2020-05-22 MED ORDER — LIP MEDEX EX OINT
TOPICAL_OINTMENT | CUTANEOUS | Status: DC | PRN
  Filled 2020-05-22: qty 7

## 2020-05-22 MED ORDER — NALOXONE HCL 0.4 MG/ML IJ SOLN: 0.4000 mg | INTRAMUSCULAR | Status: DC | PRN

## 2020-05-22 MED ORDER — HYDROMORPHONE HCL 2 MG/ML IJ SOLN
2.0000 mg | Freq: Once | INTRAMUSCULAR | Status: AC
  Administered 2020-05-22: 2 mg via INTRAVENOUS
  Filled 2020-05-22: qty 1

## 2020-05-22 MED ORDER — HYDROMORPHONE HCL 2 MG/ML IJ SOLN: 3.0000 mg | INTRAMUSCULAR | Status: DC | PRN

## 2020-05-22 MED ORDER — LACTATED RINGERS IV BOLUS
1000.0000 mL | Freq: Once | INTRAVENOUS | Status: AC
  Administered 2020-05-22: 1000 mL via INTRAVENOUS

## 2020-05-22 MED ORDER — SODIUM CHLORIDE 0.9 % IV SOLN
1.0000 g | Freq: Three times a day (TID) | INTRAVENOUS | Status: DC
  Administered 2020-05-22 – 2020-05-24 (×7): 1 g via INTRAVENOUS
  Filled 2020-05-22 (×8): qty 1

## 2020-05-22 MED ORDER — HYDROMORPHONE HCL 2 MG/ML IJ SOLN
2.0000 mg | INTRAMUSCULAR | Status: DC | PRN
  Administered 2020-05-22 (×3): 2 mg via INTRAVENOUS
  Filled 2020-05-22 (×3): qty 1

## 2020-05-22 MED ORDER — METHOCARBAMOL 1000 MG/10ML IJ SOLN: 500.0000 mg | Freq: Once | INTRAVENOUS | Status: DC

## 2020-05-22 MED ORDER — SENNOSIDES-DOCUSATE SODIUM 8.6-50 MG PO TABS
1.0000 | ORAL_TABLET | Freq: Two times a day (BID) | ORAL | Status: DC
  Administered 2020-05-22 – 2020-05-23 (×4): 1 via ORAL
  Filled 2020-05-22 (×7): qty 1

## 2020-05-22 NOTE — ED Notes (Signed)
Dr. Kirtland Bouchard came to the bedside.  Pt continues to be in severe pain, additional dose of pain medication given.

## 2020-05-22 NOTE — ED Notes (Signed)
Pain is decreases.  Pt is insistent that she be admitted to [facility].  Dr. Kirtland Bouchard aware.

## 2020-05-22 NOTE — ED Notes (Signed)
Per charge we can not start PCA in ED.  Will closely watch for pain and give PRN pain medication

## 2020-05-22 NOTE — ED Notes (Signed)
Pt is ambulating to the bathroom to void 

## 2020-05-22 NOTE — ED Notes (Signed)
Pt is having severe pain again, EDPA notified

## 2020-05-22 NOTE — Progress Notes (Signed)
Pharmacy Antibiotic Note  Jordan Chung is a 30 y.o. female admitted on 05/21/2020 with lap chole 5/2 complicated by postop bile leak with subsequent biloma drain placement and ERCP w/ sphincterotomy and stent. Patient presents with worsening pain possibly due to acute pancreatitis.  Pharmacy has been consulted for mereopenem dosing.  Plan: Meropenem 1 g iv q 8 hours  F/U renal function, plans for antibiotics   Height: 5\' 8"  (172.7 cm) Weight: 103.9 kg (229 lb 0.9 oz) IBW/kg (Calculated) : 63.9  Temp (24hrs), Avg:99.3 F (37.4 C), Min:99.3 F (37.4 C), Max:99.3 F (37.4 C)  Recent Labs  Lab 05/16/20 1038 05/21/20 1958 05/21/20 2109  WBC 10.3 19.1*  --   CREATININE 0.83 0.83  --   LATICACIDVEN  --   --  1.2    Estimated Creatinine Clearance: 122.7 mL/min (by C-G formula based on SCr of 0.83 mg/dL).    No Known Allergies   Thank you for allowing pharmacy to be a part of this patient's care.  2110 05/22/2020 6:35 AM

## 2020-05-22 NOTE — ED Notes (Signed)
Pt suddenly feels pain decrease to 7/10 and is able to breathe calmer, will hold off on fentanyl briefly.

## 2020-05-22 NOTE — H&P (View-Only) (Signed)
Reason for Consult: Increased abdominal pain Referring Physician: Hospital team  Jordan Chung is an 30 y.o. female.  HPI: Patient seen and examined and discussed with the hospital team as well as the patient's significant other and father and she works on the surgical floor here and her history was extensively discussed her hospital computer chart reviewed including her previous work-up and x-rays and ERCP and she was better when she went home on Tuesday but her drainage has continued and her pain increased this weekend and she has had some nausea and vomiting and we answered all of her and her father's questions Past Medical History:  Diagnosis Date  . Hyperemesis gravidarum   . Postpartum depression     Past Surgical History:  Procedure Laterality Date  . BILIARY STENT PLACEMENT N/A 05/15/2020   Procedure: BILIARY STENT PLACEMENT;  Surgeon: Karki, Arya, MD;  Location: [facility];  Service: Gastroenterology;  Laterality: N/A;  . CHOLECYSTECTOMY N/A 05/09/2020   Procedure: LAPAROSCOPIC CHOLECYSTECTOMY WITH INTRAOPERATIVE CHOLANGIOGRAM;  Surgeon: Blackman, Douglas, MD;  Location: [facility];  Service: General;  Laterality: N/A;  . ENDOSCOPIC RETROGRADE CHOLANGIOPANCREATOGRAPHY (ERCP) WITH PROPOFOL N/A 05/15/2020   Procedure: ENDOSCOPIC RETROGRADE CHOLANGIOPANCREATOGRAPHY (ERCP) WITH PROPOFOL;  Surgeon: Karki, Arya, MD;  Location: [facility];  Service: Gastroenterology;  Laterality: N/A;  . PANCREATIC STENT PLACEMENT  05/15/2020   Procedure: PANCREATIC STENT PLACEMENT;  Surgeon: Karki, Arya, MD;  Location: [facility];  Service: Gastroenterology;;  . SPHINCTEROTOMY  05/15/2020   Procedure: SPHINCTEROTOMY;  Surgeon: Karki, Arya, MD;  Location: [facility];  Service: Gastroenterology;;    History reviewed. No pertinent family history.  Social History:  reports that she has never smoked. She has never used smokeless tobacco. She reports that she does not drink alcohol and does not use  drugs.  Allergies: No Known Allergies  Medications: I have reviewed the patient's current medications.  Results for orders placed or performed during the hospital encounter of 05/21/20 (from the past 48 hour(s))  CBC with Differential     Status: Abnormal   Collection Time: 05/21/20  7:58 PM  Result Value Ref Range   WBC 19.1 (H) 4.0 - 10.5 K/uL   RBC 4.70 3.87 - 5.11 MIL/uL   Hemoglobin 11.4 (L) 12.0 - 15.0 g/dL   HCT 37.8 36.0 - 46.0 %   MCV 80.4 80.0 - 100.0 fL   MCH 24.3 (L) 26.0 - 34.0 pg   MCHC 30.2 30.0 - 36.0 g/dL   RDW 17.8 (H) 11.5 - 15.5 %   Platelets 350 150 - 400 K/uL   nRBC 0.0 0.0 - 0.2 %   Neutrophils Relative % 77 %   Neutro Abs 14.8 (H) 1.7 - 7.7 K/uL   Lymphocytes Relative 17 %   Lymphs Abs 3.3 0.7 - 4.0 K/uL   Monocytes Relative 4 %   Monocytes Absolute 0.8 0.1 - 1.0 K/uL   Eosinophils Relative 1 %   Eosinophils Absolute 0.2 0.0 - 0.5 K/uL   Basophils Relative 0 %   Basophils Absolute 0.0 0.0 - 0.1 K/uL   Immature Granulocytes 1 %   Abs Immature Granulocytes 0.09 (H) 0.00 - 0.07 K/uL    Comment: Performed at [facility], [address] [place], [place] [address]  Comprehensive metabolic panel     Status: Abnormal   Collection Time: 05/21/20  7:58 PM  Result Value Ref Range   Sodium 139 135 - 145 mmol/L   Potassium 4.9 3.5 - 5.1 mmol/L   Chloride 108 98 -   111 mmol/L   CO2 21 (L) 22 - 32 mmol/L   Glucose, Bld 78 70 - 99 mg/dL    Comment: Glucose reference range applies only to samples taken after fasting for at least 8 hours.   BUN 13 6 - 20 mg/dL   Creatinine, Ser 1.610.83 0.44 - 1.00 mg/dL   Calcium 8.8 (L) 8.9 - 10.3 mg/dL   Total Protein 6.8 6.5 - 8.1 g/dL   Albumin 3.7 3.5 - 5.0 g/dL   AST 30 15 - 41 U/L   ALT 19 0 - 44 U/L   Alkaline Phosphatase 80 38 - 126 U/L   Total Bilirubin 1.3 (H) 0.3 - 1.2 mg/dL   GFR, Estimated >09>60 >60>60 mL/min    Comment: (NOTE) Calculated using the CKD-EPI Creatinine Equation (2021)    Anion gap  10 5 - 15    Comment: Performed at [facility], [address] [place], [place] [address]  Lipase, blood     Status: None   Collection Time: 05/21/20  7:58 PM  Result Value Ref Range   Lipase 47 11 - 51 U/L    Comment: Performed at [facility], [address] [place], [place] [address]  hCG, quantitative, pregnancy     Status: None   Collection Time: 05/21/20  7:58 PM  Result Value Ref Range   hCG, Beta Chain, Quant, S 1 <5 mIU/mL    Comment:          GEST. AGE      CONC.  (mIU/mL)   <=1 WEEK        5 - 50     2 WEEKS       50 - 500     3 WEEKS       100 - 10,000     4 WEEKS     1,000 - 30,000     5 WEEKS     3,500 - 115,000   6-8 WEEKS     12,000 - 270,000    12 WEEKS     15,000 - 220,000        FEMALE AND NON-PREGNANT FEMALE:     LESS THAN 5 mIU/mL Performed at [facility], [address] [place], [place] [address]   Lactic acid, plasma     Status: None   Collection Time: 05/21/20  9:09 PM  Result Value Ref Range   Lactic Acid, Venous 1.2 0.5 - 1.9 mmol/L    Comment: Performed at [facility], [address] [place], [place] [address]  Resp Panel by RT-PCR (Flu A&B, Covid) Nasopharyngeal Swab     Status: None   Collection Time: 05/21/20  9:59 PM   Specimen: Nasopharyngeal Swab; Nasopharyngeal(NP) swabs in vial transport medium  Result Value Ref Range   SARS Coronavirus 2 by RT PCR NEGATIVE NEGATIVE    Comment: (NOTE) SARS-CoV-2 target nucleic acids are NOT DETECTED.  The SARS-CoV-2 RNA is generally detectable in upper respiratory specimens during the acute phase of infection. The lowest concentration of SARS-CoV-2 viral copies this assay can detect is 138 copies/mL. A negative result does not preclude SARS-Cov-2 infection and should not be used as the sole basis for treatment or other patient management decisions. A negative result may occur with  improper specimen collection/handling,  submission of specimen other than nasopharyngeal swab, presence of viral mutation(s) within the areas targeted by this assay, and inadequate number of viral copies(<138 copies/mL). A negative result must be  combined with clinical observations, patient history, and epidemiological information. The expected result is Negative.  Fact Sheet for Patients:  BloggerCourse.com  Fact Sheet for Healthcare Providers:  SeriousBroker.it  This test is no t yet approved or cleared by the [place] FDA and  has been authorized for detection and/or diagnosis of SARS-CoV-2 by FDA under an Emergency Use Authorization (EUA). This EUA will remain  in effect (meaning this test can be used) for the duration of the COVID-19 declaration under Section 564(b)(1) of the Act, 21 U.S.C.section 360bbb-3(b)(1), unless the authorization is terminated  or revoked sooner.       Influenza A by PCR NEGATIVE NEGATIVE   Influenza B by PCR NEGATIVE NEGATIVE    Comment: (NOTE) The Xpert Xpress SARS-CoV-2/FLU/RSV plus assay is intended as an aid in the diagnosis of influenza from Nasopharyngeal swab specimens and should not be used as a sole basis for treatment. Nasal washings and aspirates are unacceptable for Xpert Xpress SARS-CoV-2/FLU/RSV testing.  Fact Sheet for Patients: BloggerCourse.com  Fact Sheet for Healthcare Providers: SeriousBroker.it  This test is not yet approved or cleared by the [place] FDA and has been authorized for detection and/or diagnosis of SARS-CoV-2 by FDA under an Emergency Use Authorization (EUA). This EUA will remain in effect (meaning this test can be used) for the duration of the COVID-19 declaration under Section 564(b)(1) of the Act, 21 U.S.C. section 360bbb-3(b)(1), unless the authorization is terminated or revoked.  Performed at [facility], [address] [place], [place] [address]   Lactic acid, plasma     Status: None   Collection Time: 05/22/20  6:28 AM  Result Value Ref Range   Lactic Acid, Venous 0.6 0.5 - 1.9 mmol/L    Comment: Performed at [facility], [address], [place], [place] [address]  CBC with Differential     Status: Abnormal   Collection Time: 05/22/20  6:29 AM  Result Value Ref Range   WBC 13.9 (H) 4.0 - 10.5 K/uL   RBC 4.00 3.87 - 5.11 MIL/uL   Hemoglobin 9.7 (L) 12.0 - 15.0 g/dL   HCT 21.3 (L) 08.6 - 57.8 %   MCV 81.0 80.0 - 100.0 fL   MCH 24.3 (L) 26.0 - 34.0 pg   MCHC 29.9 (L) 30.0 - 36.0 g/dL   RDW 46.9 (H) 62.9 - 52.8 %   Platelets 256 150 - 400 K/uL   nRBC 0.0 0.0 - 0.2 %   Neutrophils Relative % 73 %   Neutro Abs 10.1 (H) 1.7 - 7.7 K/uL   Lymphocytes Relative 20 %   Lymphs Abs 2.8 0.7 - 4.0 K/uL   Monocytes Relative 6 %   Monocytes Absolute 0.8 0.1 - 1.0 K/uL   Eosinophils Relative 1 %   Eosinophils Absolute 0.1 0.0 - 0.5 K/uL   Basophils Relative 0 %   Basophils Absolute 0.0 0.0 - 0.1 K/uL   Immature Granulocytes 0 %   Abs Immature Granulocytes 0.05 0.00 - 0.07 K/uL    Comment: Performed at [facility], [address] [place], [place] [address]  Comprehensive metabolic panel     Status: Abnormal   Collection Time: 05/22/20  6:29 AM  Result Value Ref Range   Sodium 136 135 - 145 mmol/L   Potassium 3.6 3.5 - 5.1 mmol/L    Comment: DELTA CHECK NOTED   Chloride 108 98 - 111 mmol/L   CO2 25 22 - 32 mmol/L   Glucose, Bld  110 (H) 70 - 99 mg/dL    Comment: Glucose reference range applies only to samples taken after fasting for at least 8 hours.   BUN 9 6 - 20 mg/dL   Creatinine, Ser 6.23 0.44 - 1.00 mg/dL   Calcium 8.1 (L) 8.9 - 10.3 mg/dL   Total Protein 5.9 (L) 6.5 - 8.1 g/dL   Albumin 3.2 (L) 3.5 - 5.0 g/dL   AST 12 (L) 15 - 41 U/L   ALT 16 0 - 44 U/L   Alkaline Phosphatase 74 38 - 126 U/L   Total Bilirubin 0.7 0.3 - 1.2 mg/dL   GFR,  Estimated >76 >28 mL/min    Comment: (NOTE) Calculated using the CKD-EPI Creatinine Equation (2021)    Anion gap 3 (L) 5 - 15    Comment: Performed at [facility], [address] [place], [place] [address]  Lipase, blood     Status: None   Collection Time: 05/22/20  6:29 AM  Result Value Ref Range   Lipase 21 11 - 51 U/L    Comment: Performed at [facility], [address] [place], [place] [address]  Troponin I (High Sensitivity)     Status: None   Collection Time: 05/22/20  6:29 AM  Result Value Ref Range   Troponin I (High Sensitivity) 3 <18 ng/L    Comment: (NOTE) Elevated high sensitivity troponin I (hsTnI) values and significant  changes across serial measurements may suggest ACS but many other  chronic and acute conditions are known to elevate hsTnI results.  Refer to the Links section for chest pain algorithms and additional  guidance. Performed at [facility], [address], [place], [place] [address]   Magnesium     Status: None   Collection Time: 05/22/20  6:29 AM  Result Value Ref Range   Magnesium 1.9 1.7 - 2.4 mg/dL    Comment: Performed at [facility], [address], [place], [place] [address]  Type and screen [facility]     Status: None   Collection Time: 05/22/20  7:22 AM  Result Value Ref Range   ABO/RH(D) O POS    Antibody Screen NEG    Sample Expiration      05/25/2020,2359 Performed at [facility], [address], [place], [place] [address]   Lactic acid, plasma     Status: None   Collection Time: 05/22/20  8:41 AM  Result Value Ref Range   Lactic Acid, Venous 1.0 0.5 - 1.9 mmol/L    Comment: Performed at [facility], [address] [place], [place] [address]  ABO/Rh     Status: None   Collection Time: 05/22/20  8:55 AM  Result Value Ref Range   ABO/RH(D)      O POS Performed at [facility], [address], [place], [place] [address]   Urinalysis, Routine w reflex microscopic Urine, Clean Catch     Status: Abnormal   Collection Time: 05/22/20 10:29 AM  Result Value Ref Range   Color, Urine STRAW (A) YELLOW   APPearance CLEAR CLEAR   Specific Gravity, Urine 1.009 1.005 - 1.030   pH 6.0 5.0 - 8.0   Glucose, UA NEGATIVE NEGATIVE mg/dL   Hgb urine dipstick NEGATIVE NEGATIVE   Bilirubin Urine NEGATIVE NEGATIVE   Ketones, ur NEGATIVE NEGATIVE mg/dL   Protein, ur NEGATIVE NEGATIVE mg/dL   Nitrite NEGATIVE NEGATIVE   Leukocytes,Ua NEGATIVE NEGATIVE  Comment: Performed at [facility], [address], [place], [place] [address]    CT Abdomen Pelvis Wo Contrast  Result Date: 05/21/2020 CLINICAL DATA:  Abdominal pain and fever. Postop. Recent cholecystectomy complicated by bile leak with biliary stenting and percutaneous drainage. EXAM: CT ABDOMEN AND PELVIS WITHOUT CONTRAST TECHNIQUE: Multidetector CT imaging of the abdomen and pelvis was performed following the standard protocol without IV contrast. COMPARISON:  Most recent CT 05/13/2020 FINDINGS: Lower chest: Mild lower lobe atelectasis.  No pleural fluid. Hepatobiliary: Biliary stent in place decompressing the biliary tree. No intra or extrahepatic biliary ductal dilatation. Drainage catheter in the gallbladder fossa with resolution of previous fluid collection. No inflammation along the course of the catheter. No new hepatic abnormality. Pancreas: Faint peripancreatic stranding about the pancreatic head, series 2, image 34. No ductal dilatation. No acute pancreatic collection. Spleen: Normal in size without focal abnormality. Adrenals/Urinary Tract: Normal adrenal glands. Bilateral intrarenal calculi without hydronephrosis or perinephric edema. No ureteral stones. No perinephric edema. Urinary bladder is minimally distended. Stomach/Bowel: Patulous distal esophagus. Unremarkable stomach. Normal positioning  of the duodenum and ligament of Treitz. Normal small bowel without obstruction or inflammation. Normal appendix. Moderate colonic stool burden without colonic wall thickening or inflammation. Vascular/Lymphatic: Normal caliber abdominal aorta. Mild aortic atherosclerosis, advanced for age. No enlarged lymph nodes in the abdomen or pelvis. Reproductive: Uterus and bilateral adnexa are unremarkable. Other: Fluid collection in the gallbladder fossa has resolved after percutaneous drainage. No new intra-abdominal collection. No free fluid. No free air. Postsurgical change of the anterior abdominal wall without subcutaneous collection. Musculoskeletal: There are no acute or suspicious osseous abnormalities. IMPRESSION: 1. Percutaneous drainage catheter in the gallbladder fossa with resolved fluid collection. 2. Biliary stent in place decompressing the biliary tree. No intra or extrahepatic biliary ductal dilatation. 3. Faint peripancreatic stranding about the pancreatic head, can be seen with acute pancreatitis. Recommend correlation with pancreatic enzymes. 4. Bilateral nonobstructing nephrolithiasis. 5. Mild aortic atherosclerosis is age advanced. Aortic Atherosclerosis (ICD10-I70.0). Electronically Signed   By: Narda Rutherford M.D.   On: 05/21/2020 23:46   DG Abdomen 1 View  Result Date: 05/21/2020 CLINICAL DATA:  Abdominal pain. Cholecystectomy 12 days ago, recent biliary stent placement. EXAM: ABDOMEN - 1 VIEW COMPARISON:  CT 05/13/2020 FINDINGS: Portable supine views of the abdomen obtained. Right upper quadrant biliary stent in place. Right upper quadrant drainage catheter in place. Surgical clips from cholecystectomy. No bowel dilatation or evidence of obstruction. Small to moderate colonic stool burden. Intrarenal calculi on recent CT are partially obscured by overlying stool. C shaped 2 cm small bore catheter projects over the left sacrum, unclear if this is external to the patient. Low lung volumes  without significant pleural effusion or focal airspace disease. IMPRESSION: 1. Right upper quadrant biliary stent in place. Right upper quadrant drainage catheter in place. 2. Small bore C-shaped density projecting over the left pelvis is of unknown etiology, may be external to the patient. 3. Normal bowel gas pattern. Electronically Signed   By: Narda Rutherford M.D.   On: 05/21/2020 20:28    Review of Systems negative except above Blood pressure 103/60, pulse 78, temperature 98.2 F (36.8 C), temperature source Oral, resp. rate 16, height 5\' 8"  (1.727 m), weight 103.9 kg, last menstrual period 07/23/2019, SpO2 100 %, currently breastfeeding. Physical Exam patient in mild pain distress certainly uncomfortable abdomen is soft some discomfort more in the upper than the lower no rebound white count decreased today hemoglobin stable liver tests lipase  normal today BUN/creatinine normal  Assessment/Plan: After everything was thoroughly reviewed I think she has a persistent bile leak despite no fluid on the CT but based on persistent drainage of bilious material from her drain and will go ahead and repeat a nuclear HIDA scan to confirm the leak and if so she will need her stent replaced and hopefully enlarged to a 10 [place] and that tentative diagnosis and if so plan was thoroughly discussed with the family and the patient and we discussed the reasons for placing a pancreatic stent which is no longer present on x-rays  [facility] 05/22/2020, 1:00 PM

## 2020-05-22 NOTE — Consult Note (Signed)
Reason for Consult:pancreatitis Referring Physician: Dr. Karleen Dolphin is an 30 y.o. female.  HPI: This is a 30 year old female who had undergone a laparoscopic cholecystectomy for cholecystitis with cholelithiasis on 5/2.  She returned to the hospital several days later with abdominal pain.  She underwent a HIDA scan which suggested a very small bile leak.  There was a small amount of fluid in the gallbladder fossa so interventional radiology placed a drain.  GI was asked see the patient.  She underwent difficult ERCP on 5/8 and had to have a stent placed in the pancreatic duct as well as the common bile duct.  A small leak could not be demonstrated during the procedure.  She was discharged home on 5/10.  Over the past 24 to 48 hours she started having worsening epigastric abdominal pain.  She presented back to the emergency department and a CAT scan of the abdomen pelvis showed evidence of pancreatitis.  Her white blood count was elevated to 19 K as well as her bilirubin to 1.3.  Lipase was 173. This morning, she still reports significant pain in her abdomen.  Her white blood count has decreased to 13.9.  Her lipase and bilirubin are now normal.  She reports that the drain has been draining about 40 cc to 60 cc daily.  Past Medical History:  Diagnosis Date  . Hyperemesis gravidarum   . Postpartum depression     Past Surgical History:  Procedure Laterality Date  . BILIARY STENT PLACEMENT N/A 05/15/2020   Procedure: BILIARY STENT PLACEMENT;  Surgeon: Kerin Salen, MD;  Location: [facility];  Service: Gastroenterology;  Laterality: N/A;  . CHOLECYSTECTOMY N/A 05/09/2020   Procedure: LAPAROSCOPIC CHOLECYSTECTOMY WITH INTRAOPERATIVE CHOLANGIOGRAM;  Surgeon: Abigail Miyamoto, MD;  Location: [facility];  Service: General;  Laterality: N/A;  . ENDOSCOPIC RETROGRADE CHOLANGIOPANCREATOGRAPHY (ERCP) WITH PROPOFOL N/A 05/15/2020   Procedure: ENDOSCOPIC RETROGRADE CHOLANGIOPANCREATOGRAPHY (ERCP) WITH  PROPOFOL;  Surgeon: Kerin Salen, MD;  Location: [facility];  Service: Gastroenterology;  Laterality: N/A;  . PANCREATIC STENT PLACEMENT  05/15/2020   Procedure: PANCREATIC STENT PLACEMENT;  Surgeon: Kerin Salen, MD;  Location: [facility];  Service: Gastroenterology;;  . [name]  05/15/2020   Procedure: [name];  Surgeon: Kerin Salen, MD;  Location: [facility];  Service: Gastroenterology;;    History reviewed. No pertinent family history.  Social History:  reports that she has never smoked. She has never used smokeless tobacco. She reports that she does not drink alcohol and does not use drugs.  Allergies: No Known Allergies  Medications: I have reviewed the patient's current medications.  Results for orders placed or performed during the hospital encounter of 05/21/20 (from the past 48 hour(s))  CBC with Differential     Status: Abnormal   Collection Time: 05/21/20  7:58 PM  Result Value Ref Range   WBC 19.1 (H) 4.0 - 10.5 K/uL   RBC 4.70 3.87 - 5.11 MIL/uL   Hemoglobin 11.4 (L) 12.0 - 15.0 g/dL   HCT 16.1 09.6 - 04.5 %   MCV 80.4 80.0 - 100.0 fL   MCH 24.3 (L) 26.0 - 34.0 pg   MCHC 30.2 30.0 - 36.0 g/dL   RDW 40.9 (H) 81.1 - 91.4 %   Platelets 350 150 - 400 K/uL   nRBC 0.0 0.0 - 0.2 %   Neutrophils Relative % 77 %   Neutro Abs 14.8 (H) 1.7 - 7.7 K/uL   Lymphocytes Relative 17 %   Lymphs Abs 3.3 0.7 - 4.0 K/uL  Monocytes Relative 4 %   Monocytes Absolute 0.8 0.1 - 1.0 K/uL   Eosinophils Relative 1 %   Eosinophils Absolute 0.2 0.0 - 0.5 K/uL   Basophils Relative 0 %   Basophils Absolute 0.0 0.0 - 0.1 K/uL   Immature Granulocytes 1 %   Abs Immature Granulocytes 0.09 (H) 0.00 - 0.07 K/uL    Comment: Performed at [facility], [address] [place], [place] [address]  Comprehensive metabolic panel     Status: Abnormal   Collection Time: 05/21/20  7:58 PM  Result Value Ref Range   Sodium 139 135 - 145 mmol/L   Potassium 4.9 3.5 - 5.1  mmol/L   Chloride 108 98 - 111 mmol/L   CO2 21 (L) 22 - 32 mmol/L   Glucose, Bld 78 70 - 99 mg/dL    Comment: Glucose reference range applies only to samples taken after fasting for at least 8 hours.   BUN 13 6 - 20 mg/dL   Creatinine, Ser 7.78 0.44 - 1.00 mg/dL   Calcium 8.8 (L) 8.9 - 10.3 mg/dL   Total Protein 6.8 6.5 - 8.1 g/dL   Albumin 3.7 3.5 - 5.0 g/dL   AST 30 15 - 41 U/L   ALT 19 0 - 44 U/L   Alkaline Phosphatase 80 38 - 126 U/L   Total Bilirubin 1.3 (H) 0.3 - 1.2 mg/dL   GFR, Estimated >24 >23 mL/min    Comment: (NOTE) Calculated using the CKD-EPI Creatinine Equation (2021)    Anion gap 10 5 - 15    Comment: Performed at [facility], [address], [place], [place] [address]  Lipase, blood     Status: None   Collection Time: 05/21/20  7:58 PM  Result Value Ref Range   Lipase 47 11 - 51 U/L    Comment: Performed at [facility], [address] [place], [place] [address]  hCG, quantitative, pregnancy     Status: None   Collection Time: 05/21/20  7:58 PM  Result Value Ref Range   hCG, Beta Chain, Quant, S 1 <5 mIU/mL    Comment:          GEST. AGE      CONC.  (mIU/mL)   <=1 WEEK        5 - 50     2 WEEKS       50 - 500     3 WEEKS       100 - 10,000     4 WEEKS     1,000 - 30,000     5 WEEKS     3,500 - 115,000   6-8 WEEKS     12,000 - 270,000    12 WEEKS     15,000 - 220,000        FEMALE AND NON-PREGNANT FEMALE:     LESS THAN 5 mIU/mL Performed at [facility], [address] [place], [place] [address]   Lactic acid, plasma     Status: None   Collection Time: 05/21/20  9:09 PM  Result Value Ref Range   Lactic Acid, Venous 1.2 0.5 - 1.9 mmol/L    Comment: Performed at [facility], [address] [place], [place] [address]  Resp Panel by RT-PCR (Flu A&B, Covid) Nasopharyngeal Swab     Status: None   Collection Time: 05/21/20  9:59 PM   Specimen: Nasopharyngeal Swab;  Nasopharyngeal(NP) swabs in vial transport medium  Result Value Ref Range   SARS Coronavirus 2 by RT PCR NEGATIVE NEGATIVE    Comment: (NOTE) SARS-CoV-2 target nucleic acids are NOT DETECTED.  The SARS-CoV-2 RNA is generally detectable in upper respiratory specimens during the acute phase of infection. The lowest concentration of SARS-CoV-2 viral copies this assay can detect is 138 copies/mL. A negative result does not preclude SARS-Cov-2 infection and should not be used as the sole basis for treatment or other patient management decisions. A negative result may occur with  improper specimen collection/handling, submission of specimen other than nasopharyngeal swab, presence of viral mutation(s) within the areas targeted by this assay, and inadequate number of viral copies(<138 copies/mL). A negative result must be combined with clinical observations, patient history, and epidemiological information. The expected result is Negative.  Fact Sheet for Patients:  BloggerCourse.com  Fact Sheet for Healthcare Providers:  SeriousBroker.it  This test is no t yet approved or cleared by the [place] FDA and  has been authorized for detection and/or diagnosis of SARS-CoV-2 by FDA under an Emergency Use Authorization (EUA). This EUA will remain  in effect (meaning this test can be used) for the duration of the COVID-19 declaration under Section 564(b)(1) of the Act, 21 U.S.C.section 360bbb-3(b)(1), unless the authorization is terminated  or revoked sooner.       Influenza A by PCR NEGATIVE NEGATIVE   Influenza B by PCR NEGATIVE NEGATIVE    Comment: (NOTE) The Xpert Xpress SARS-CoV-2/FLU/RSV plus assay is intended as an aid in the diagnosis of influenza from Nasopharyngeal swab specimens and should not be used as a sole basis for treatment. Nasal washings and aspirates are unacceptable for Xpert Xpress  SARS-CoV-2/FLU/RSV testing.  Fact Sheet for Patients: BloggerCourse.com  Fact Sheet for Healthcare Providers: SeriousBroker.it  This test is not yet approved or cleared by the [place] FDA and has been authorized for detection and/or diagnosis of SARS-CoV-2 by FDA under an Emergency Use Authorization (EUA). This EUA will remain in effect (meaning this test can be used) for the duration of the COVID-19 declaration under Section 564(b)(1) of the Act, 21 U.S.C. section 360bbb-3(b)(1), unless the authorization is terminated or revoked.  Performed at [facility], [address] [place], [place] [address]   Lactic acid, plasma     Status: None   Collection Time: 05/22/20  6:28 AM  Result Value Ref Range   Lactic Acid, Venous 0.6 0.5 - 1.9 mmol/L    Comment: Performed at [facility], [address], [place], [place] [address]  CBC with Differential     Status: Abnormal   Collection Time: 05/22/20  6:29 AM  Result Value Ref Range   WBC 13.9 (H) 4.0 - 10.5 K/uL   RBC 4.00 3.87 - 5.11 MIL/uL   Hemoglobin 9.7 (L) 12.0 - 15.0 g/dL   HCT 62.9 (L) 47.6 - 54.6 %   MCV 81.0 80.0 - 100.0 fL   MCH 24.3 (L) 26.0 - 34.0 pg   MCHC 29.9 (L) 30.0 - 36.0 g/dL   RDW 50.3 (H) 54.6 - 56.8 %   Platelets 256 150 - 400 K/uL   nRBC 0.0 0.0 - 0.2 %   Neutrophils Relative % 73 %   Neutro Abs 10.1 (H) 1.7 - 7.7 K/uL   Lymphocytes Relative 20 %   Lymphs Abs 2.8 0.7 - 4.0 K/uL   Monocytes Relative 6 %   Monocytes Absolute 0.8 0.1 - 1.0 K/uL   Eosinophils Relative 1 %  Eosinophils Absolute 0.1 0.0 - 0.5 K/uL   Basophils Relative 0 %   Basophils Absolute 0.0 0.0 - 0.1 K/uL   Immature Granulocytes 0 %   Abs Immature Granulocytes 0.05 0.00 - 0.07 K/uL    Comment: Performed at [facility], [address] [place], [place] [address]  Comprehensive metabolic panel     Status: Abnormal    Collection Time: 05/22/20  6:29 AM  Result Value Ref Range   Sodium 136 135 - 145 mmol/L   Potassium 3.6 3.5 - 5.1 mmol/L    Comment: DELTA CHECK NOTED   Chloride 108 98 - 111 mmol/L   CO2 25 22 - 32 mmol/L   Glucose, Bld 110 (H) 70 - 99 mg/dL    Comment: Glucose reference range applies only to samples taken after fasting for at least 8 hours.   BUN 9 6 - 20 mg/dL   Creatinine, Ser 0.10 0.44 - 1.00 mg/dL   Calcium 8.1 (L) 8.9 - 10.3 mg/dL   Total Protein 5.9 (L) 6.5 - 8.1 g/dL   Albumin 3.2 (L) 3.5 - 5.0 g/dL   AST 12 (L) 15 - 41 U/L   ALT 16 0 - 44 U/L   Alkaline Phosphatase 74 38 - 126 U/L   Total Bilirubin 0.7 0.3 - 1.2 mg/dL   GFR, Estimated >27 >25 mL/min    Comment: (NOTE) Calculated using the CKD-EPI Creatinine Equation (2021)    Anion gap 3 (L) 5 - 15    Comment: Performed at [facility], [address] [place], [place] [address]  Lipase, blood     Status: None   Collection Time: 05/22/20  6:29 AM  Result Value Ref Range   Lipase 21 11 - 51 U/L    Comment: Performed at [facility], [address], [place], [place] [address]  Troponin I (High Sensitivity)     Status: None   Collection Time: 05/22/20  6:29 AM  Result Value Ref Range   Troponin I (High Sensitivity) 3 <18 ng/L    Comment: (NOTE) Elevated high sensitivity troponin I (hsTnI) values and significant  changes across serial measurements may suggest ACS but many other  chronic and acute conditions are known to elevate hsTnI results.  Refer to the Links section for chest pain algorithms and additional  guidance. Performed at [facility], [address] [place], [place] [address]   Magnesium     Status: None   Collection Time: 05/22/20  6:29 AM  Result Value Ref Range   Magnesium 1.9 1.7 - 2.4 mg/dL    Comment: Performed at [facility], [address], [place], [place] [address]    CT Abdomen Pelvis Wo Contrast  Result  Date: 05/21/2020 CLINICAL DATA:  Abdominal pain and fever. Postop. Recent cholecystectomy complicated by bile leak with biliary stenting and percutaneous drainage. EXAM: CT ABDOMEN AND PELVIS WITHOUT CONTRAST TECHNIQUE: Multidetector CT imaging of the abdomen and pelvis was performed following the standard protocol without IV contrast. COMPARISON:  Most recent CT 05/13/2020 FINDINGS: Lower chest: Mild lower lobe atelectasis.  No pleural fluid. Hepatobiliary: Biliary stent in place decompressing the biliary tree. No intra or extrahepatic biliary ductal dilatation. Drainage catheter in the gallbladder fossa with resolution of previous fluid collection. No inflammation along the course of the catheter. No new hepatic abnormality. Pancreas: Faint peripancreatic stranding about the pancreatic head, series 2, image 34. No ductal dilatation. No acute pancreatic collection. Spleen: Normal in  size without focal abnormality. Adrenals/Urinary Tract: Normal adrenal glands. Bilateral intrarenal calculi without hydronephrosis or perinephric edema. No ureteral stones. No perinephric edema. Urinary bladder is minimally distended. Stomach/Bowel: Patulous distal esophagus. Unremarkable stomach. Normal positioning of the duodenum and ligament of Treitz. Normal small bowel without obstruction or inflammation. Normal appendix. Moderate colonic stool burden without colonic wall thickening or inflammation. Vascular/Lymphatic: Normal caliber abdominal aorta. Mild aortic atherosclerosis, advanced for age. No enlarged lymph nodes in the abdomen or pelvis. Reproductive: Uterus and bilateral adnexa are unremarkable. Other: Fluid collection in the gallbladder fossa has resolved after percutaneous drainage. No new intra-abdominal collection. No free fluid. No free air. Postsurgical change of the anterior abdominal wall without subcutaneous collection. Musculoskeletal: There are no acute or suspicious osseous abnormalities. IMPRESSION: 1.  Percutaneous drainage catheter in the gallbladder fossa with resolved fluid collection. 2. Biliary stent in place decompressing the biliary tree. No intra or extrahepatic biliary ductal dilatation. 3. Faint peripancreatic stranding about the pancreatic head, can be seen with acute pancreatitis. Recommend correlation with pancreatic enzymes. 4. Bilateral nonobstructing nephrolithiasis. 5. Mild aortic atherosclerosis is age advanced. Aortic Atherosclerosis (ICD10-I70.0). Electronically Signed   By: Narda Rutherford M.D.   On: 05/21/2020 23:46   DG Abdomen 1 View  Result Date: 05/21/2020 CLINICAL DATA:  Abdominal pain. Cholecystectomy 12 days ago, recent biliary stent placement. EXAM: ABDOMEN - 1 VIEW COMPARISON:  CT 05/13/2020 FINDINGS: Portable supine views of the abdomen obtained. Right upper quadrant biliary stent in place. Right upper quadrant drainage catheter in place. Surgical clips from cholecystectomy. No bowel dilatation or evidence of obstruction. Small to moderate colonic stool burden. Intrarenal calculi on recent CT are partially obscured by overlying stool. C shaped 2 cm small bore catheter projects over the left sacrum, unclear if this is external to the patient. Low lung volumes without significant pleural effusion or focal airspace disease. IMPRESSION: 1. Right upper quadrant biliary stent in place. Right upper quadrant drainage catheter in place. 2. Small bore C-shaped density projecting over the left pelvis is of unknown etiology, may be external to the patient. 3. Normal bowel gas pattern. Electronically Signed   By: Narda Rutherford M.D.   On: 05/21/2020 20:28    Review of Systems  All other systems reviewed and are negative.  Blood pressure 114/66, pulse 78, temperature 98.2 F (36.8 C), temperature source Oral, resp. rate 15, height 5\' 8"  (1.727 m), weight 103.9 kg, last menstrual period 07/23/2019, SpO2 100 %, currently breastfeeding. Physical Exam Constitutional:      Comments:  Appears uncomfortable  Eyes:     General: No scleral icterus. Cardiovascular:     Rate and Rhythm: Normal rate and regular rhythm.  Pulmonary:     Breath sounds: Normal breath sounds.     Comments: Respiratory effort looks  Increased.  Fluid in the drain is bilious Skin:    General: Skin is warm and dry.  Neurological:     General: No focal deficit present.     Mental Status: She is alert.  Psychiatric:        Behavior: Behavior normal.     Assessment/Plan: Post ERCP pancreatitis  She needs continue bowel rest and aggressive IV rehydration as well as pain control Would recommend GI consultation. Despite the resolution of fluid in the gallbladder fossa and the truly not being demonstrated on ERCP, given the consistency and output of the drain, we will continue to leave it in for now. We will follow her closely with you.  07/25/2019 05/22/2020,  8:38 AM

## 2020-05-22 NOTE — ED Notes (Signed)
Pt continues to be in excruciating pain.  Spoke with PA earlier who wrote for 1mg  dilaudid which I gave and messaged admitting MD to see about giving an additional 1mg  dilaudid which he agreed to.  Pt continues to be in severe pain after second 1mg  dialudid.  Sent message to Dr. to come see pt due to severe pain, he states that he is on the way.

## 2020-05-22 NOTE — ED Notes (Signed)
Pt continues to be in distress, admitting MD aware as I have messaged him frequently throughout night.  New orders received.

## 2020-05-22 NOTE — H&P (Signed)
History and Physical    Jordan Chung GYI:[id number] DOB: 1990/02/20 DOA: 05/21/2020  PCP: [facility]  Patient coming from: Home.  Chief Complaint: Abdominal pain.  HPI: Jordan Chung is a 30 y.o. female with recent laparoscopic cholecystectomy on May 09, 2020 by Dr. Magnus Ivan, which was complicated with postoperative bile leak and patient underwent CT-guided biloma drain placement and also on May 16 2018-second ERCP with sphincterotomy and stent placement by Dr. [name] was discharged home on May 17, 2020 presents to the ER because of worsening pain.  Patient states the pain has not completely subsided but acutely worsened over the last 24 hours with nausea and pain is more centered mostly in the center of the abdomen.  Stabbing in nature.  ED Course: In the ER patient was afebrile.  CT abdomen pelvis shows features concerning for pancreatic inflammation.  Labs show total bilirubin 1.3 AST 30 ALT 19 lipase 47 lactic acid normal WBC count 19.1 and hemoglobin 12.4.  ER physician discussed with on-call general surgeon Dr. Carolynne Edouard who at this time feel that patient has postprocedural pancreatitis and requested hospitalist admission.  Patient has been started on fluids pain medication and admitted for further management.  COVID test is negative.  Review of Systems: As per HPI, rest all negative.   Past Medical History:  Diagnosis Date  . Hyperemesis gravidarum   . Postpartum depression     Past Surgical History:  Procedure Laterality Date  . BILIARY STENT PLACEMENT N/A 05/15/2020   Procedure: BILIARY STENT PLACEMENT;  Surgeon: Kerin Salen, MD;  Location: [facility];  Service: Gastroenterology;  Laterality: N/A;  . CHOLECYSTECTOMY N/A 05/09/2020   Procedure: LAPAROSCOPIC CHOLECYSTECTOMY WITH INTRAOPERATIVE CHOLANGIOGRAM;  Surgeon: Abigail Miyamoto, MD;  Location: [facility];  Service: General;  Laterality: N/A;  . ENDOSCOPIC RETROGRADE CHOLANGIOPANCREATOGRAPHY (ERCP) WITH PROPOFOL N/A 05/15/2020    Procedure: ENDOSCOPIC RETROGRADE CHOLANGIOPANCREATOGRAPHY (ERCP) WITH PROPOFOL;  Surgeon: Kerin Salen, MD;  Location: [facility];  Service: Gastroenterology;  Laterality: N/A;  . PANCREATIC STENT PLACEMENT  05/15/2020   Procedure: PANCREATIC STENT PLACEMENT;  Surgeon: Kerin Salen, MD;  Location: [facility];  Service: Gastroenterology;;  . [name]  05/15/2020   Procedure: [name];  Surgeon: Kerin Salen, MD;  Location: [facility];  Service: Gastroenterology;;     reports that she has never smoked. She has never used smokeless tobacco. She reports that she does not drink alcohol and does not use drugs.  No Known Allergies  History reviewed. No pertinent family history.  Prior to Admission medications   Medication Sig Start Date End Date Taking? Authorizing Provider  docusate sodium (COLACE) 100 MG capsule Take 1 capsule (100 mg total) by mouth 2 (two) times daily. Patient taking differently: Take 100 mg by mouth daily as needed for mild constipation. 12/05/19 12/04/20 Yes Rasch, Victorino Dike I, NP  ibuprofen (ADVIL) 200 MG tablet Take 200 mg by mouth every 6 (six) hours as needed for moderate pain.   Yes [provider]  methocarbamol (ROBAXIN) 500 MG tablet Take 1 tablet (500 mg total) by mouth every 6 (six) hours as needed for muscle spasms. 05/17/20  Yes Trixie Deis R, PA-C  oxyCODONE (OXY IR/ROXICODONE) 5 MG immediate release tablet Take 1-2 tablets (5-10 mg total) by mouth every 6 (six) hours as needed for moderate pain or severe pain. 05/17/20  Yes Juliet Rude, PA-C  Prenatal Vit-Fe Fumarate-FA (MULTIVITAMIN-PRENATAL) 27-0.8 MG TABS tablet Take 1 tablet by mouth daily.   Yes [provider]  sodium chloride flush (  NS) 0.9 % SOLN 5 mLs by Intracatheter route every 8 (eight) hours. 05/17/20  Yes Trixie Deis R, PA-C  traMADol (ULTRAM) 50 MG tablet Take 1 tablet (50 mg total) by mouth every 6 (six) hours as needed for moderate pain or severe pain. 05/17/20   Yes Juliet Rude, PA-C  acetaminophen (TYLENOL) 325 MG tablet Take 2 tablets (650 mg total) by mouth every 6 (six) hours as needed for mild pain (or temp > 100). 05/17/20   Juliet Rude, PA-C  ondansetron (ZOFRAN) 4 MG tablet Take 1 tablet (4 mg total) by mouth every 6 (six) hours. 05/11/20   Curatolo, Adam, DO  dicyclomine (BENTYL) 20 MG tablet Take 1 tablet (20 mg total) by mouth 2 (two) times daily. 03/27/19 10/06/19  Lorelee New, PA-C    Physical Exam: Constitutional: Moderately built and nourished. Vitals:   05/21/20 2210 05/21/20 2300 05/21/20 2333 05/22/20 0200  BP: 108/65 117/71 121/63 (!) 119/57  Pulse: 68 71 72 72  Resp: 15     Temp:      TempSrc:      SpO2: 100% 100% 100% 99%  Weight:      Height:       Eyes: Anicteric no pallor. ENMT: No discharge from the ears eyes nose and mouth. Neck: No muscle.  No neck rigidity. Respiratory: No rhonchi or crepitations. Cardiovascular: S1-S2 heard. Abdomen: Soft no guarding or rigidity tenderness diffuse. Musculoskeletal: No edema. Skin: No rash. Neurologic: Alert awake oriented time place and person.  Moves all extremities. Psychiatric: Appears normal.   Labs on Admission: I have personally reviewed following labs and imaging studies  CBC: Recent Labs  Lab 05/16/20 1038 05/21/20 1958  WBC 10.3 19.1*  NEUTROABS  --  14.8*  HGB 9.2* 11.4*  HCT 30.6* 37.8  MCV 80.5 80.4  PLT 287 350   Basic Metabolic Panel: Recent Labs  Lab 05/16/20 1038 05/21/20 1958  NA 142 139  K 3.6 4.9  CL 109 108  CO2 27 21*  GLUCOSE 104* 78  BUN 6 13  CREATININE 0.83 0.83  CALCIUM 8.8* 8.8*   GFR: Estimated Creatinine Clearance: 122.7 mL/min (by C-G formula based on SCr of 0.83 mg/dL). Liver Function Tests: Recent Labs  Lab 05/16/20 1038 05/21/20 1958  AST 31 30  ALT 45* 19  ALKPHOS 108 80  BILITOT 0.5 1.3*  PROT 6.3* 6.8  ALBUMIN 3.2* 3.7   Recent Labs  Lab 05/16/20 1038 05/21/20 1958  LIPASE 173* 47   No  results for input(s): AMMONIA in the last 168 hours. Coagulation Profile: No results for input(s): INR, PROTIME in the last 168 hours. Cardiac Enzymes: No results for input(s): CKTOTAL, CKMB, CKMBINDEX, TROPONINI in the last 168 hours. BNP (last 3 results) No results for input(s): PROBNP in the last 8760 hours. HbA1C: No results for input(s): HGBA1C in the last 72 hours. CBG: No results for input(s): GLUCAP in the last 168 hours. Lipid Profile: No results for input(s): CHOL, HDL, LDLCALC, TRIG, CHOLHDL, LDLDIRECT in the last 72 hours. Thyroid Function Tests: No results for input(s): TSH, T4TOTAL, FREET4, T3FREE, THYROIDAB in the last 72 hours. Anemia Panel: No results for input(s): VITAMINB12, FOLATE, FERRITIN, TIBC, IRON, RETICCTPCT in the last 72 hours. Urine analysis:    Component Value Date/Time   COLORURINE YELLOW 05/11/2020 1704   APPEARANCEUR CLEAR 05/11/2020 1704   LABSPEC 1.010 05/11/2020 1704   PHURINE 7.5 05/11/2020 1704   GLUCOSEU NEGATIVE 05/11/2020 1704   HGBUR NEGATIVE 05/11/2020 1704  BILIRUBINUR NEGATIVE 05/11/2020 1704   KETONESUR NEGATIVE 05/11/2020 1704   PROTEINUR NEGATIVE 05/11/2020 1704   NITRITE NEGATIVE 05/11/2020 1704   LEUKOCYTESUR NEGATIVE 05/11/2020 1704   Sepsis Labs: @LABRCNTIP (procalcitonin:4,lacticidven:4) ) Recent Results (from the past 240 hour(s))  Resp Panel by RT-PCR (Flu A&B, Covid) Nasopharyngeal Swab     Status: None   Collection Time: 05/13/20  9:28 AM   Specimen: Nasopharyngeal Swab; Nasopharyngeal(NP) swabs in vial transport medium  Result Value Ref Range Status   SARS Coronavirus 2 by RT PCR NEGATIVE NEGATIVE Final    Comment: (NOTE) SARS-CoV-2 target nucleic acids are NOT DETECTED.  The SARS-CoV-2 RNA is generally detectable in upper respiratory specimens during the acute phase of infection. The lowest concentration of SARS-CoV-2 viral copies this assay can detect is 138 copies/mL. A negative result does not preclude  SARS-Cov-2 infection and should not be used as the sole basis for treatment or other patient management decisions. A negative result may occur with  improper specimen collection/handling, submission of specimen other than nasopharyngeal swab, presence of viral mutation(s) within the areas targeted by this assay, and inadequate number of viral copies(<138 copies/mL). A negative result must be combined with clinical observations, patient history, and epidemiological information. The expected result is Negative.  Fact Sheet for Patients:  BloggerCourse.comhttps://www.fda.gov/media/152166/download  Fact Sheet for Healthcare Providers:  SeriousBroker.ithttps://www.fda.gov/media/152162/download  This test is no t yet approved or cleared by the [place] FDA and  has been authorized for detection and/or diagnosis of SARS-CoV-2 by FDA under an Emergency Use Authorization (EUA). This EUA will remain  in effect (meaning this test can be used) for the duration of the COVID-19 declaration under Section 564(b)(1) of the Act, 21 U.S.C.section 360bbb-3(b)(1), unless the authorization is terminated  or revoked sooner.       Influenza A by PCR NEGATIVE NEGATIVE Final   Influenza B by PCR NEGATIVE NEGATIVE Final    Comment: (NOTE) The Xpert Xpress SARS-CoV-2/FLU/RSV plus assay is intended as an aid in the diagnosis of influenza from Nasopharyngeal swab specimens and should not be used as a sole basis for treatment. Nasal washings and aspirates are unacceptable for Xpert Xpress SARS-CoV-2/FLU/RSV testing.  Fact Sheet for Patients: BloggerCourse.comhttps://www.fda.gov/media/152166/download  Fact Sheet for Healthcare Providers: SeriousBroker.ithttps://www.fda.gov/media/152162/download  This test is not yet approved or cleared by the [place] FDA and has been authorized for detection and/or diagnosis of SARS-CoV-2 by FDA under an Emergency Use Authorization (EUA). This EUA will remain in effect (meaning this test can be used) for the duration of  the COVID-19 declaration under Section 564(b)(1) of the Act, 21 U.S.C. section 360bbb-3(b)(1), unless the authorization is terminated or revoked.  Performed at [facility], [address] [place], [place] [address]   Aerobic/Anaerobic Culture (surgical/deep wound)     Status: None   Collection Time: 05/14/20  2:28 PM   Specimen: Gallbladder; Abscess  Result Value Ref Range Status   Specimen Description   Final    GALL BLADDER Performed at [facility], [address] [place], [place] [address]    Special Requests   Final    ABSCESS Performed at [facility], [address] [place], [place] [address]    Gram Stain   Final    RARE WBC PRESENT, PREDOMINANTLY PMN NO ORGANISMS SEEN    Culture   Final    No growth aerobically or anaerobically. Performed at [facility], [address] [place], [place] [address]    Report Status  05/19/2020 FINAL  Final  Resp Panel by RT-PCR (Flu A&B, Covid) Nasopharyngeal Swab     Status: None   Collection Time: 05/21/20  9:59 PM   Specimen: Nasopharyngeal Swab; Nasopharyngeal(NP) swabs in vial transport medium  Result Value Ref Range Status   SARS Coronavirus 2 by RT PCR NEGATIVE NEGATIVE Final    Comment: (NOTE) SARS-CoV-2 target nucleic acids are NOT DETECTED.  The SARS-CoV-2 RNA is generally detectable in upper respiratory specimens during the acute phase of infection. The lowest concentration of SARS-CoV-2 viral copies this assay can detect is 138 copies/mL. A negative result does not preclude SARS-Cov-2 infection and should not be used as the sole basis for treatment or other patient management decisions. A negative result may occur with  improper specimen collection/handling, submission of specimen other than nasopharyngeal swab, presence of viral mutation(s) within the areas targeted by this assay, and inadequate number of viral copies(<138 copies/mL). A negative result  must be combined with clinical observations, patient history, and epidemiological information. The expected result is Negative.  Fact Sheet for Patients:  BloggerCourse.com  Fact Sheet for Healthcare Providers:  SeriousBroker.it  This test is no t yet approved or cleared by the [place] FDA and  has been authorized for detection and/or diagnosis of SARS-CoV-2 by FDA under an Emergency Use Authorization (EUA). This EUA will remain  in effect (meaning this test can be used) for the duration of the COVID-19 declaration under Section 564(b)(1) of the Act, 21 U.S.C.section 360bbb-3(b)(1), unless the authorization is terminated  or revoked sooner.       Influenza A by PCR NEGATIVE NEGATIVE Final   Influenza B by PCR NEGATIVE NEGATIVE Final    Comment: (NOTE) The Xpert Xpress SARS-CoV-2/FLU/RSV plus assay is intended as an aid in the diagnosis of influenza from Nasopharyngeal swab specimens and should not be used as a sole basis for treatment. Nasal washings and aspirates are unacceptable for Xpert Xpress SARS-CoV-2/FLU/RSV testing.  Fact Sheet for Patients: BloggerCourse.com  Fact Sheet for Healthcare Providers: SeriousBroker.it  This test is not yet approved or cleared by the [place] FDA and has been authorized for detection and/or diagnosis of SARS-CoV-2 by FDA under an Emergency Use Authorization (EUA). This EUA will remain in effect (meaning this test can be used) for the duration of the COVID-19 declaration under Section 564(b)(1) of the Act, 21 U.S.C. section 360bbb-3(b)(1), unless the authorization is terminated or revoked.  Performed at [facility], [address], [place], [place] [address]      Radiological Exams on Admission: CT Abdomen Pelvis Wo Contrast  Result Date: 05/21/2020 CLINICAL DATA:  Abdominal pain and fever. Postop.  Recent cholecystectomy complicated by bile leak with biliary stenting and percutaneous drainage. EXAM: CT ABDOMEN AND PELVIS WITHOUT CONTRAST TECHNIQUE: Multidetector CT imaging of the abdomen and pelvis was performed following the standard protocol without IV contrast. COMPARISON:  Most recent CT 05/13/2020 FINDINGS: Lower chest: Mild lower lobe atelectasis.  No pleural fluid. Hepatobiliary: Biliary stent in place decompressing the biliary tree. No intra or extrahepatic biliary ductal dilatation. Drainage catheter in the gallbladder fossa with resolution of previous fluid collection. No inflammation along the course of the catheter. No new hepatic abnormality. Pancreas: Faint peripancreatic stranding about the pancreatic head, series 2, image 34. No ductal dilatation. No acute pancreatic collection. Spleen: Normal in size without focal abnormality. Adrenals/Urinary Tract: Normal adrenal glands. Bilateral intrarenal calculi without hydronephrosis or perinephric edema. No ureteral stones. No perinephric edema. Urinary bladder is minimally distended.  Stomach/Bowel: Patulous distal esophagus. Unremarkable stomach. Normal positioning of the duodenum and ligament of Treitz. Normal small bowel without obstruction or inflammation. Normal appendix. Moderate colonic stool burden without colonic wall thickening or inflammation. Vascular/Lymphatic: Normal caliber abdominal aorta. Mild aortic atherosclerosis, advanced for age. No enlarged lymph nodes in the abdomen or pelvis. Reproductive: Uterus and bilateral adnexa are unremarkable. Other: Fluid collection in the gallbladder fossa has resolved after percutaneous drainage. No new intra-abdominal collection. No free fluid. No free air. Postsurgical change of the anterior abdominal wall without subcutaneous collection. Musculoskeletal: There are no acute or suspicious osseous abnormalities. IMPRESSION: 1. Percutaneous drainage catheter in the gallbladder fossa with resolved  fluid collection. 2. Biliary stent in place decompressing the biliary tree. No intra or extrahepatic biliary ductal dilatation. 3. Faint peripancreatic stranding about the pancreatic head, can be seen with acute pancreatitis. Recommend correlation with pancreatic enzymes. 4. Bilateral nonobstructing nephrolithiasis. 5. Mild aortic atherosclerosis is age advanced. Aortic Atherosclerosis (ICD10-I70.0). Electronically Signed   By: Narda Rutherford M.D.   On: 05/21/2020 23:46   DG Abdomen 1 View  Result Date: 05/21/2020 CLINICAL DATA:  Abdominal pain. Cholecystectomy 12 days ago, recent biliary stent placement. EXAM: ABDOMEN - 1 VIEW COMPARISON:  CT 05/13/2020 FINDINGS: Portable supine views of the abdomen obtained. Right upper quadrant biliary stent in place. Right upper quadrant drainage catheter in place. Surgical clips from cholecystectomy. No bowel dilatation or evidence of obstruction. Small to moderate colonic stool burden. Intrarenal calculi on recent CT are partially obscured by overlying stool. C shaped 2 cm small bore catheter projects over the left sacrum, unclear if this is external to the patient. Low lung volumes without significant pleural effusion or focal airspace disease. IMPRESSION: 1. Right upper quadrant biliary stent in place. Right upper quadrant drainage catheter in place. 2. Small bore C-shaped density projecting over the left pelvis is of unknown etiology, may be external to the patient. 3. Normal bowel gas pattern. Electronically Signed   By: Narda Rutherford M.D.   On: 05/21/2020 20:28      Assessment/Plan Active Problems:   Abdominal pain   Acute pancreatitis    1. Abdominal pain likely from postprocedure acute pancreatitis -ER patient is already notified surgeon Dr. Carolynne Edouard.  We will keep patient on aggressive IV hydration and pain relief medications.  May have to start on Dilaudid PCA.  We will keep patient n.p.o. 2. Recent cholecystectomy complicated with bile leak status  post CT-guided bile drain placement and also had underwent ERCP with sphincterotomy and stent placement. 3. Leukocytosis could be reactionary.  Closely observe. 4. Anemia follow CBC.  Patient has pancreatitis and severe abdominal pain will need inpatient status.   DVT prophylaxis: SCDs for now until no procedures planned for surgery or GI. Code Status: Full code. Family Communication: Patient's dad at the bedside. Disposition Plan: Home when stable. Consults called: General surgery. Admission status: Inpatient   Eduard Clos MD [facility] Pager [phone].  If 7PM-7AM, please contact night-coverage [email or web address] Password [facility]  05/22/2020, 2:54 AM

## 2020-05-22 NOTE — ED Notes (Signed)
Family calls out that pt is in severe pain.  Pt is having 8/10 sharp abdominal pain and is moaning in pain.  Dr. Kirtland Bouchard notified and await orders.

## 2020-05-22 NOTE — Progress Notes (Signed)
PROGRESS NOTE    Jordan Chung  ZOX:[id number] DOB: Mar 11, 1990 DOA: 05/21/2020 PCP: [facility] (Confirm with patient/family/NH records and if not entered, this HAS to be entered at [facility] point of entry. "No PCP" if truly none.)   Chief Complaint  Patient presents with  . Abdominal Pain    Brief Narrative: (Start on day 1 of progress note - keep it brief and live) Patient is a pleasant 30 year old female, recent laparoscopic cholecystectomy 05/09/2020 complicated by postop bile leak subsequently underwent CT guided biloma drain placement.  On 05/15/2020 patient underwent second ERCP with sphincterotomy and stent placement per Dr. [name] and subsequently discharged home 05/17/2020 presented to the ED with worsening epigastric and right upper quadrant abdominal pain.  CT abdomen and pelvis with some pancreatic inflammation, elevated total bilirubin of 1.3, lipase level of 47 down from 173 ( 05/16/2020).  Patient also noted to have a leukocytosis white count of 19.1.  General surgery consulted and recommended medicine admission with general surgical consultation. -Patient admitted placed on aggressive fluid resuscitation, pain management for post ERCP pancreatitis.   Assessment & Plan:   Principal Problem:   Post-ERCP acute pancreatitis Active Problems:   Abdominal pain   Acute pancreatitis   Anemia   SOB (shortness of breath)   Leukocytosis   S/P laparoscopic cholecystectomy  1 abdominal pain secondary to post ERCP pancreatitis -Patient presented with epigastric and right upper quadrant abdominal pain after recent hospitalization for postop bile leak status post ERCP with sphincterotomy and stent placement 05/15/2020. -CT abdomen and pelvis done on presentation with concern for some pancreatic inflammation/acute pancreatitis.  Bilirubin noted to be at 1.3.  Lipase level of 47 down from 173(05/16/2020).  Patient also with a leukocytosis white count of 19,000. -Patient seen in consultation  by general surgery who are recommending GI consultation. -CT abdomen and pelvis with resolution of fluid in gallbladder fossa, drain still with consistent output. -Continue aggressive IV fluid resuscitation. -Discontinue Dilaudid PCA pump. -Placed on Dilaudid 2 mg IV every 2 hours as needed severe pain.  Toradol 30 mg IV every 6 hours as needed moderate pain. -Continue IV Merrem -Senokot-S twice daily. -Bowel rest. -General surgery following.  GI consultation pending.  2.  Shortness of breath -Patient with shortness of breath which she states is secondary to epigastric and right upper quadrant pain around biliary drain site. -Check a chest x-ray. -DC Dilaudid PCA and placed on IV Dilaudid as needed. -Supportive care.  3.  Recent cholecystectomy complicated by bile leak/status post CT-guided bowel drain placement. -Per general surgery.  4.  Leukocytosis -Likely reactive secondary to #1. -Check a chest x-ray, check blood cultures x2.  Check a UA with cultures and sensitivities. -WBC trending down -Continue empiric antibiotics of IV Merrem  5.  Anemia -Likely iron deficiency anemia and menstruating female.  Patient status post recent childbirth. -Check an anemia panel. -Follow H&H. -Transfusion threshold hemoglobin < 7.   DVT prophylaxis: Lovenox Code Status: Full Family Communication: Updated patient and husband at bedside. Disposition:   Status is: Inpatient    Dispo: The patient is from: Home              Anticipated d/c is to: Home              Patient currently with acute post ERCP pancreatitis with significant abdominal pain, on IV fluids, not stable for discharge   Difficult to place patient: No       Consultants:   General surgery: Dr.  Magnus IvanBlackman 05/22/2020  Gastroenterology pending  Procedures:   CT abdomen and pelvis 05/21/2020    Antimicrobials:   IV Merrem 05/22/2020   Subjective: Patient with complaints of shortness of breath secondary to pain  around JP site.  Patient with complaints of significant epigastric and right upper quadrant pain with pain going down right abdominal region.  No emesis this morning.  Denies any midsternal chest pain.  Objective: Vitals:   05/22/20 0700 05/22/20 0719 05/22/20 0720 05/22/20 0803  BP: (!) 99/52 99/70  114/66  Pulse: 77 97  78  Resp:  15  15  Temp:    98.2 F (36.8 C)  TempSrc:    Oral  SpO2: 100% 97% 97% 100%  Weight:      Height:        Intake/Output Summary (Last 24 hours) at 05/22/2020 1053 Last data filed at 05/22/2020 0825 Gross per 24 hour  Intake 2000 ml  Output 2 ml  Net 1998 ml   Filed Weights   05/21/20 1926  Weight: 103.9 kg    Examination:  General exam: In moderate distress in pain. Respiratory system: Clear to auscultation bilaterally.  No wheezes, no crackles, no rhonchi. Respiratory effort increased. Cardiovascular system: S1 & S2 heard, RRR. No JVD, murmurs, rubs, gallops or clicks. No pedal edema. Gastrointestinal system: Abdomen is nondistended, soft and tender to palpation in the epigastrium and also right upper quadrant around drain site.  Right JP drain with bilious drainage noted.  Positive bowel sounds.  No rebound.  No guarding.  Central nervous system: Alert and oriented. No focal neurological deficits. Extremities: Symmetric 5 x 5 power. Skin: No rashes, lesions or ulcers Psychiatry: Judgement and insight appear normal. Mood & affect appropriate.     Data Reviewed: I have personally reviewed following labs and imaging studies  CBC: Recent Labs  Lab 05/16/20 1038 05/21/20 1958 05/22/20 0629  WBC 10.3 19.1* 13.9*  NEUTROABS  --  14.8* 10.1*  HGB 9.2* 11.4* 9.7*  HCT 30.6* 37.8 32.4*  MCV 80.5 80.4 81.0  PLT 287 350 256    Basic Metabolic Panel: Recent Labs  Lab 05/16/20 1038 05/21/20 1958 05/22/20 0629  NA 142 139 136  K 3.6 4.9 3.6  CL 109 108 108  CO2 27 21* 25  GLUCOSE 104* 78 110*  BUN 6 13 9   CREATININE 0.83 0.83 0.63   CALCIUM 8.8* 8.8* 8.1*  MG  --   --  1.9    GFR: Estimated Creatinine Clearance: 127.3 mL/min (by C-G formula based on SCr of 0.63 mg/dL).  Liver Function Tests: Recent Labs  Lab 05/16/20 1038 05/21/20 1958 05/22/20 0629  AST 31 30 12*  ALT 45* 19 16  ALKPHOS 108 80 74  BILITOT 0.5 1.3* 0.7  PROT 6.3* 6.8 5.9*  ALBUMIN 3.2* 3.7 3.2*    CBG: No results for input(s): GLUCAP in the last 168 hours.   Recent Results (from the past 240 hour(s))  Resp Panel by RT-PCR (Flu A&B, Covid) Nasopharyngeal Swab     Status: None   Collection Time: 05/13/20  9:28 AM   Specimen: Nasopharyngeal Swab; Nasopharyngeal(NP) swabs in vial transport medium  Result Value Ref Range Status   SARS Coronavirus 2 by RT PCR NEGATIVE NEGATIVE Final    Comment: (NOTE) SARS-CoV-2 target nucleic acids are NOT DETECTED.  The SARS-CoV-2 RNA is generally detectable in upper respiratory specimens during the acute phase of infection. The lowest concentration of SARS-CoV-2 viral copies this assay can detect is  138 copies/mL. A negative result does not preclude SARS-Cov-2 infection and should not be used as the sole basis for treatment or other patient management decisions. A negative result may occur with  improper specimen collection/handling, submission of specimen other than nasopharyngeal swab, presence of viral mutation(s) within the areas targeted by this assay, and inadequate number of viral copies(<138 copies/mL). A negative result must be combined with clinical observations, patient history, and epidemiological information. The expected result is Negative.  Fact Sheet for Patients:  BloggerCourse.com  Fact Sheet for Healthcare Providers:  SeriousBroker.it  This test is no t yet approved or cleared by the [place] FDA and  has been authorized for detection and/or diagnosis of SARS-CoV-2 by FDA under an Emergency Use Authorization (EUA). This  EUA will remain  in effect (meaning this test can be used) for the duration of the COVID-19 declaration under Section 564(b)(1) of the Act, 21 U.S.C.section 360bbb-3(b)(1), unless the authorization is terminated  or revoked sooner.       Influenza A by PCR NEGATIVE NEGATIVE Final   Influenza B by PCR NEGATIVE NEGATIVE Final    Comment: (NOTE) The Xpert Xpress SARS-CoV-2/FLU/RSV plus assay is intended as an aid in the diagnosis of influenza from Nasopharyngeal swab specimens and should not be used as a sole basis for treatment. Nasal washings and aspirates are unacceptable for Xpert Xpress SARS-CoV-2/FLU/RSV testing.  Fact Sheet for Patients: BloggerCourse.com  Fact Sheet for Healthcare Providers: SeriousBroker.it  This test is not yet approved or cleared by the [place] FDA and has been authorized for detection and/or diagnosis of SARS-CoV-2 by FDA under an Emergency Use Authorization (EUA). This EUA will remain in effect (meaning this test can be used) for the duration of the COVID-19 declaration under Section 564(b)(1) of the Act, 21 U.S.C. section 360bbb-3(b)(1), unless the authorization is terminated or revoked.  Performed at [facility], [address] [place], [place] [address]   Aerobic/Anaerobic Culture (surgical/deep wound)     Status: None   Collection Time: 05/14/20  2:28 PM   Specimen: Gallbladder; Abscess  Result Value Ref Range Status   Specimen Description   Final    GALL BLADDER Performed at [facility], [address], [place], [place] [address]    Special Requests   Final    ABSCESS Performed at [facility], [address], [place], [place] [address]    Gram Stain   Final    RARE WBC PRESENT, PREDOMINANTLY PMN NO ORGANISMS SEEN    Culture   Final    No growth aerobically or anaerobically. Performed at [facility], [address] [place], [place] [address]    Report Status 05/19/2020 FINAL  Final  Resp Panel by RT-PCR (Flu A&B, Covid) Nasopharyngeal Swab     Status: None   Collection Time: 05/21/20  9:59 PM   Specimen: Nasopharyngeal Swab; Nasopharyngeal(NP) swabs in vial transport medium  Result Value Ref Range Status   SARS Coronavirus 2 by RT PCR NEGATIVE NEGATIVE Final    Comment: (NOTE) SARS-CoV-2 target nucleic acids are NOT DETECTED.  The SARS-CoV-2 RNA is generally detectable in upper respiratory specimens during the acute phase of infection. The lowest concentration of SARS-CoV-2 viral copies this assay can detect is 138 copies/mL. A negative result does not preclude SARS-Cov-2 infection and should not be used as the sole basis for treatment or other patient management decisions. A negative result may occur with  improper specimen collection/handling, submission  of specimen other than nasopharyngeal swab, presence of viral mutation(s) within the areas targeted by this assay, and inadequate number of viral copies(<138 copies/mL). A negative result must be combined with clinical observations, patient history, and epidemiological information. The expected result is Negative.  Fact Sheet for Patients:  BloggerCourse.com  Fact Sheet for Healthcare Providers:  SeriousBroker.it  This test is no t yet approved or cleared by the [place] FDA and  has been authorized for detection and/or diagnosis of SARS-CoV-2 by FDA under an Emergency Use Authorization (EUA). This EUA will remain  in effect (meaning this test can be used) for the duration of the COVID-19 declaration under Section 564(b)(1) of the Act, 21 U.S.C.section 360bbb-3(b)(1), unless the authorization is terminated  or revoked sooner.       Influenza A by PCR NEGATIVE NEGATIVE Final   Influenza B by PCR NEGATIVE NEGATIVE Final    Comment: (NOTE) The Xpert Xpress SARS-CoV-2/FLU/RSV plus  assay is intended as an aid in the diagnosis of influenza from Nasopharyngeal swab specimens and should not be used as a sole basis for treatment. Nasal washings and aspirates are unacceptable for Xpert Xpress SARS-CoV-2/FLU/RSV testing.  Fact Sheet for Patients: BloggerCourse.com  Fact Sheet for Healthcare Providers: SeriousBroker.it  This test is not yet approved or cleared by the [place] FDA and has been authorized for detection and/or diagnosis of SARS-CoV-2 by FDA under an Emergency Use Authorization (EUA). This EUA will remain in effect (meaning this test can be used) for the duration of the COVID-19 declaration under Section 564(b)(1) of the Act, 21 U.S.C. section 360bbb-3(b)(1), unless the authorization is terminated or revoked.  Performed at [facility], [address] [place], [place] [address]          Radiology Studies: CT Abdomen Pelvis Wo Contrast  Result Date: 05/21/2020 CLINICAL DATA:  Abdominal pain and fever. Postop. Recent cholecystectomy complicated by bile leak with biliary stenting and percutaneous drainage. EXAM: CT ABDOMEN AND PELVIS WITHOUT CONTRAST TECHNIQUE: Multidetector CT imaging of the abdomen and pelvis was performed following the standard protocol without IV contrast. COMPARISON:  Most recent CT 05/13/2020 FINDINGS: Lower chest: Mild lower lobe atelectasis.  No pleural fluid. Hepatobiliary: Biliary stent in place decompressing the biliary tree. No intra or extrahepatic biliary ductal dilatation. Drainage catheter in the gallbladder fossa with resolution of previous fluid collection. No inflammation along the course of the catheter. No new hepatic abnormality. Pancreas: Faint peripancreatic stranding about the pancreatic head, series 2, image 34. No ductal dilatation. No acute pancreatic collection. Spleen: Normal in size without focal abnormality. Adrenals/Urinary Tract: Normal  adrenal glands. Bilateral intrarenal calculi without hydronephrosis or perinephric edema. No ureteral stones. No perinephric edema. Urinary bladder is minimally distended. Stomach/Bowel: Patulous distal esophagus. Unremarkable stomach. Normal positioning of the duodenum and ligament of Treitz. Normal small bowel without obstruction or inflammation. Normal appendix. Moderate colonic stool burden without colonic wall thickening or inflammation. Vascular/Lymphatic: Normal caliber abdominal aorta. Mild aortic atherosclerosis, advanced for age. No enlarged lymph nodes in the abdomen or pelvis. Reproductive: Uterus and bilateral adnexa are unremarkable. Other: Fluid collection in the gallbladder fossa has resolved after percutaneous drainage. No new intra-abdominal collection. No free fluid. No free air. Postsurgical change of the anterior abdominal wall without subcutaneous collection. Musculoskeletal: There are no acute or suspicious osseous abnormalities. IMPRESSION: 1. Percutaneous drainage catheter in the gallbladder fossa with resolved fluid collection. 2. Biliary stent in place decompressing the biliary tree. No intra or extrahepatic biliary ductal dilatation. 3.  Faint peripancreatic stranding about the pancreatic head, can be seen with acute pancreatitis. Recommend correlation with pancreatic enzymes. 4. Bilateral nonobstructing nephrolithiasis. 5. Mild aortic atherosclerosis is age advanced. Aortic Atherosclerosis (ICD10-I70.0). Electronically Signed   By: Narda Rutherford M.D.   On: 05/21/2020 23:46   DG Abdomen 1 View  Result Date: 05/21/2020 CLINICAL DATA:  Abdominal pain. Cholecystectomy 12 days ago, recent biliary stent placement. EXAM: ABDOMEN - 1 VIEW COMPARISON:  CT 05/13/2020 FINDINGS: Portable supine views of the abdomen obtained. Right upper quadrant biliary stent in place. Right upper quadrant drainage catheter in place. Surgical clips from cholecystectomy. No bowel dilatation or evidence of  obstruction. Small to moderate colonic stool burden. Intrarenal calculi on recent CT are partially obscured by overlying stool. C shaped 2 cm small bore catheter projects over the left sacrum, unclear if this is external to the patient. Low lung volumes without significant pleural effusion or focal airspace disease. IMPRESSION: 1. Right upper quadrant biliary stent in place. Right upper quadrant drainage catheter in place. 2. Small bore C-shaped density projecting over the left pelvis is of unknown etiology, may be external to the patient. 3. Normal bowel gas pattern. Electronically Signed   By: Narda Rutherford M.D.   On: 05/21/2020 20:28        Scheduled Meds: . bisacodyl  10 mg Rectal Once  . enoxaparin (LOVENOX) injection  40 mg Subcutaneous Q24H  . senna-docusate  1 tablet Oral BID   Continuous Infusions: . lactated ringers 200 mL/hr at 05/22/20 0847  . meropenem (MERREM) IV 1 g (05/22/20 0718)  . potassium chloride 10 mEq (05/22/20 1045)     LOS: 0 days    Time spent: 45 minutes    Ramiro Harvest, MD [facility]   To contact the attending provider between 7A-7P or the covering provider during after hours 7P-7A, please log into the web site [email or web address] and access using universal [place] password for that web site. If you do not have the password, please call the hospital operator.  05/22/2020, 10:53 AM

## 2020-05-22 NOTE — ED Notes (Signed)
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  They advised that [facility] is currently closed.

## 2020-05-22 NOTE — Consult Note (Signed)
Reason for Consult: Increased abdominal pain Referring Physician: Hospital team  Jordan Chung is an 30 y.o. female.  HPI: Patient seen and examined and discussed with the hospital team as well as the patient's significant other and father and she works on the surgical floor here and her history was extensively discussed her hospital computer chart reviewed including her previous work-up and x-rays and ERCP and she was better when she went home on Tuesday but her drainage has continued and her pain increased this weekend and she has had some nausea and vomiting and we answered all of her and her father's questions Past Medical History:  Diagnosis Date  . Hyperemesis gravidarum   . Postpartum depression     Past Surgical History:  Procedure Laterality Date  . BILIARY STENT PLACEMENT N/A 05/15/2020   Procedure: BILIARY STENT PLACEMENT;  Surgeon: Kerin Salen, MD;  Location: [facility];  Service: Gastroenterology;  Laterality: N/A;  . CHOLECYSTECTOMY N/A 05/09/2020   Procedure: LAPAROSCOPIC CHOLECYSTECTOMY WITH INTRAOPERATIVE CHOLANGIOGRAM;  Surgeon: Abigail Miyamoto, MD;  Location: [facility];  Service: General;  Laterality: N/A;  . ENDOSCOPIC RETROGRADE CHOLANGIOPANCREATOGRAPHY (ERCP) WITH PROPOFOL N/A 05/15/2020   Procedure: ENDOSCOPIC RETROGRADE CHOLANGIOPANCREATOGRAPHY (ERCP) WITH PROPOFOL;  Surgeon: Kerin Salen, MD;  Location: [facility];  Service: Gastroenterology;  Laterality: N/A;  . PANCREATIC STENT PLACEMENT  05/15/2020   Procedure: PANCREATIC STENT PLACEMENT;  Surgeon: Kerin Salen, MD;  Location: [facility];  Service: Gastroenterology;;  . [name]  05/15/2020   Procedure: [name];  Surgeon: Kerin Salen, MD;  Location: [facility];  Service: Gastroenterology;;    History reviewed. No pertinent family history.  Social History:  reports that she has never smoked. She has never used smokeless tobacco. She reports that she does not drink alcohol and does not use  drugs.  Allergies: No Known Allergies  Medications: I have reviewed the patient's current medications.  Results for orders placed or performed during the hospital encounter of 05/21/20 (from the past 48 hour(s))  CBC with Differential     Status: Abnormal   Collection Time: 05/21/20  7:58 PM  Result Value Ref Range   WBC 19.1 (H) 4.0 - 10.5 K/uL   RBC 4.70 3.87 - 5.11 MIL/uL   Hemoglobin 11.4 (L) 12.0 - 15.0 g/dL   HCT 96.2 95.2 - 84.1 %   MCV 80.4 80.0 - 100.0 fL   MCH 24.3 (L) 26.0 - 34.0 pg   MCHC 30.2 30.0 - 36.0 g/dL   RDW 32.4 (H) 40.1 - 02.7 %   Platelets 350 150 - 400 K/uL   nRBC 0.0 0.0 - 0.2 %   Neutrophils Relative % 77 %   Neutro Abs 14.8 (H) 1.7 - 7.7 K/uL   Lymphocytes Relative 17 %   Lymphs Abs 3.3 0.7 - 4.0 K/uL   Monocytes Relative 4 %   Monocytes Absolute 0.8 0.1 - 1.0 K/uL   Eosinophils Relative 1 %   Eosinophils Absolute 0.2 0.0 - 0.5 K/uL   Basophils Relative 0 %   Basophils Absolute 0.0 0.0 - 0.1 K/uL   Immature Granulocytes 1 %   Abs Immature Granulocytes 0.09 (H) 0.00 - 0.07 K/uL    Comment: Performed at [facility], [address], [place], [place] [address]  Comprehensive metabolic panel     Status: Abnormal   Collection Time: 05/21/20  7:58 PM  Result Value Ref Range   Sodium 139 135 - 145 mmol/L   Potassium 4.9 3.5 - 5.1 mmol/L   Chloride 108 98 -  111 mmol/L   CO2 21 (L) 22 - 32 mmol/L   Glucose, Bld 78 70 - 99 mg/dL    Comment: Glucose reference range applies only to samples taken after fasting for at least 8 hours.   BUN 13 6 - 20 mg/dL   Creatinine, Ser 1.610.83 0.44 - 1.00 mg/dL   Calcium 8.8 (L) 8.9 - 10.3 mg/dL   Total Protein 6.8 6.5 - 8.1 g/dL   Albumin 3.7 3.5 - 5.0 g/dL   AST 30 15 - 41 U/L   ALT 19 0 - 44 U/L   Alkaline Phosphatase 80 38 - 126 U/L   Total Bilirubin 1.3 (H) 0.3 - 1.2 mg/dL   GFR, Estimated >09>60 >60>60 mL/min    Comment: (NOTE) Calculated using the CKD-EPI Creatinine Equation (2021)    Anion gap  10 5 - 15    Comment: Performed at [facility], [address] [place], [place] [address]  Lipase, blood     Status: None   Collection Time: 05/21/20  7:58 PM  Result Value Ref Range   Lipase 47 11 - 51 U/L    Comment: Performed at [facility], [address] [place], [place] [address]  hCG, quantitative, pregnancy     Status: None   Collection Time: 05/21/20  7:58 PM  Result Value Ref Range   hCG, Beta Chain, Quant, S 1 <5 mIU/mL    Comment:          GEST. AGE      CONC.  (mIU/mL)   <=1 WEEK        5 - 50     2 WEEKS       50 - 500     3 WEEKS       100 - 10,000     4 WEEKS     1,000 - 30,000     5 WEEKS     3,500 - 115,000   6-8 WEEKS     12,000 - 270,000    12 WEEKS     15,000 - 220,000        FEMALE AND NON-PREGNANT FEMALE:     LESS THAN 5 mIU/mL Performed at [facility], [address] [place], [place] [address]   Lactic acid, plasma     Status: None   Collection Time: 05/21/20  9:09 PM  Result Value Ref Range   Lactic Acid, Venous 1.2 0.5 - 1.9 mmol/L    Comment: Performed at [facility], [address] [place], [place] [address]  Resp Panel by RT-PCR (Flu A&B, Covid) Nasopharyngeal Swab     Status: None   Collection Time: 05/21/20  9:59 PM   Specimen: Nasopharyngeal Swab; Nasopharyngeal(NP) swabs in vial transport medium  Result Value Ref Range   SARS Coronavirus 2 by RT PCR NEGATIVE NEGATIVE    Comment: (NOTE) SARS-CoV-2 target nucleic acids are NOT DETECTED.  The SARS-CoV-2 RNA is generally detectable in upper respiratory specimens during the acute phase of infection. The lowest concentration of SARS-CoV-2 viral copies this assay can detect is 138 copies/mL. A negative result does not preclude SARS-Cov-2 infection and should not be used as the sole basis for treatment or other patient management decisions. A negative result may occur with  improper specimen collection/handling,  submission of specimen other than nasopharyngeal swab, presence of viral mutation(s) within the areas targeted by this assay, and inadequate number of viral copies(<138 copies/mL). A negative result must be  combined with clinical observations, patient history, and epidemiological information. The expected result is Negative.  Fact Sheet for Patients:  BloggerCourse.com  Fact Sheet for Healthcare Providers:  SeriousBroker.it  This test is no t yet approved or cleared by the [place] FDA and  has been authorized for detection and/or diagnosis of SARS-CoV-2 by FDA under an Emergency Use Authorization (EUA). This EUA will remain  in effect (meaning this test can be used) for the duration of the COVID-19 declaration under Section 564(b)(1) of the Act, 21 U.S.C.section 360bbb-3(b)(1), unless the authorization is terminated  or revoked sooner.       Influenza A by PCR NEGATIVE NEGATIVE   Influenza B by PCR NEGATIVE NEGATIVE    Comment: (NOTE) The Xpert Xpress SARS-CoV-2/FLU/RSV plus assay is intended as an aid in the diagnosis of influenza from Nasopharyngeal swab specimens and should not be used as a sole basis for treatment. Nasal washings and aspirates are unacceptable for Xpert Xpress SARS-CoV-2/FLU/RSV testing.  Fact Sheet for Patients: BloggerCourse.com  Fact Sheet for Healthcare Providers: SeriousBroker.it  This test is not yet approved or cleared by the [place] FDA and has been authorized for detection and/or diagnosis of SARS-CoV-2 by FDA under an Emergency Use Authorization (EUA). This EUA will remain in effect (meaning this test can be used) for the duration of the COVID-19 declaration under Section 564(b)(1) of the Act, 21 U.S.C. section 360bbb-3(b)(1), unless the authorization is terminated or revoked.  Performed at [facility], [address] [place], [place] [address]   Lactic acid, plasma     Status: None   Collection Time: 05/22/20  6:28 AM  Result Value Ref Range   Lactic Acid, Venous 0.6 0.5 - 1.9 mmol/L    Comment: Performed at [facility], [address], [place], [place] [address]  CBC with Differential     Status: Abnormal   Collection Time: 05/22/20  6:29 AM  Result Value Ref Range   WBC 13.9 (H) 4.0 - 10.5 K/uL   RBC 4.00 3.87 - 5.11 MIL/uL   Hemoglobin 9.7 (L) 12.0 - 15.0 g/dL   HCT 21.3 (L) 08.6 - 57.8 %   MCV 81.0 80.0 - 100.0 fL   MCH 24.3 (L) 26.0 - 34.0 pg   MCHC 29.9 (L) 30.0 - 36.0 g/dL   RDW 46.9 (H) 62.9 - 52.8 %   Platelets 256 150 - 400 K/uL   nRBC 0.0 0.0 - 0.2 %   Neutrophils Relative % 73 %   Neutro Abs 10.1 (H) 1.7 - 7.7 K/uL   Lymphocytes Relative 20 %   Lymphs Abs 2.8 0.7 - 4.0 K/uL   Monocytes Relative 6 %   Monocytes Absolute 0.8 0.1 - 1.0 K/uL   Eosinophils Relative 1 %   Eosinophils Absolute 0.1 0.0 - 0.5 K/uL   Basophils Relative 0 %   Basophils Absolute 0.0 0.0 - 0.1 K/uL   Immature Granulocytes 0 %   Abs Immature Granulocytes 0.05 0.00 - 0.07 K/uL    Comment: Performed at [facility], [address] [place], [place] [address]  Comprehensive metabolic panel     Status: Abnormal   Collection Time: 05/22/20  6:29 AM  Result Value Ref Range   Sodium 136 135 - 145 mmol/L   Potassium 3.6 3.5 - 5.1 mmol/L    Comment: DELTA CHECK NOTED   Chloride 108 98 - 111 mmol/L   CO2 25 22 - 32 mmol/L   Glucose, Bld  110 (H) 70 - 99 mg/dL    Comment: Glucose reference range applies only to samples taken after fasting for at least 8 hours.   BUN 9 6 - 20 mg/dL   Creatinine, Ser 6.23 0.44 - 1.00 mg/dL   Calcium 8.1 (L) 8.9 - 10.3 mg/dL   Total Protein 5.9 (L) 6.5 - 8.1 g/dL   Albumin 3.2 (L) 3.5 - 5.0 g/dL   AST 12 (L) 15 - 41 U/L   ALT 16 0 - 44 U/L   Alkaline Phosphatase 74 38 - 126 U/L   Total Bilirubin 0.7 0.3 - 1.2 mg/dL   GFR,  Estimated >76 >28 mL/min    Comment: (NOTE) Calculated using the CKD-EPI Creatinine Equation (2021)    Anion gap 3 (L) 5 - 15    Comment: Performed at [facility], [address] [place], [place] [address]  Lipase, blood     Status: None   Collection Time: 05/22/20  6:29 AM  Result Value Ref Range   Lipase 21 11 - 51 U/L    Comment: Performed at [facility], [address] [place], [place] [address]  Troponin I (High Sensitivity)     Status: None   Collection Time: 05/22/20  6:29 AM  Result Value Ref Range   Troponin I (High Sensitivity) 3 <18 ng/L    Comment: (NOTE) Elevated high sensitivity troponin I (hsTnI) values and significant  changes across serial measurements may suggest ACS but many other  chronic and acute conditions are known to elevate hsTnI results.  Refer to the Links section for chest pain algorithms and additional  guidance. Performed at [facility], [address], [place], [place] [address]   Magnesium     Status: None   Collection Time: 05/22/20  6:29 AM  Result Value Ref Range   Magnesium 1.9 1.7 - 2.4 mg/dL    Comment: Performed at [facility], [address], [place], [place] [address]  Type and screen [facility]     Status: None   Collection Time: 05/22/20  7:22 AM  Result Value Ref Range   ABO/RH(D) O POS    Antibody Screen NEG    Sample Expiration      05/25/2020,2359 Performed at [facility], [address], [place], [place] [address]   Lactic acid, plasma     Status: None   Collection Time: 05/22/20  8:41 AM  Result Value Ref Range   Lactic Acid, Venous 1.0 0.5 - 1.9 mmol/L    Comment: Performed at [facility], [address] [place], [place] [address]  ABO/Rh     Status: None   Collection Time: 05/22/20  8:55 AM  Result Value Ref Range   ABO/RH(D)      O POS Performed at [facility], [address], [place], [place] [address]   Urinalysis, Routine w reflex microscopic Urine, Clean Catch     Status: Abnormal   Collection Time: 05/22/20 10:29 AM  Result Value Ref Range   Color, Urine STRAW (A) YELLOW   APPearance CLEAR CLEAR   Specific Gravity, Urine 1.009 1.005 - 1.030   pH 6.0 5.0 - 8.0   Glucose, UA NEGATIVE NEGATIVE mg/dL   Hgb urine dipstick NEGATIVE NEGATIVE   Bilirubin Urine NEGATIVE NEGATIVE   Ketones, ur NEGATIVE NEGATIVE mg/dL   Protein, ur NEGATIVE NEGATIVE mg/dL   Nitrite NEGATIVE NEGATIVE   Leukocytes,Ua NEGATIVE NEGATIVE  Comment: Performed at [facility], [address], [place], [place] [address]    CT Abdomen Pelvis Wo Contrast  Result Date: 05/21/2020 CLINICAL DATA:  Abdominal pain and fever. Postop. Recent cholecystectomy complicated by bile leak with biliary stenting and percutaneous drainage. EXAM: CT ABDOMEN AND PELVIS WITHOUT CONTRAST TECHNIQUE: Multidetector CT imaging of the abdomen and pelvis was performed following the standard protocol without IV contrast. COMPARISON:  Most recent CT 05/13/2020 FINDINGS: Lower chest: Mild lower lobe atelectasis.  No pleural fluid. Hepatobiliary: Biliary stent in place decompressing the biliary tree. No intra or extrahepatic biliary ductal dilatation. Drainage catheter in the gallbladder fossa with resolution of previous fluid collection. No inflammation along the course of the catheter. No new hepatic abnormality. Pancreas: Faint peripancreatic stranding about the pancreatic head, series 2, image 34. No ductal dilatation. No acute pancreatic collection. Spleen: Normal in size without focal abnormality. Adrenals/Urinary Tract: Normal adrenal glands. Bilateral intrarenal calculi without hydronephrosis or perinephric edema. No ureteral stones. No perinephric edema. Urinary bladder is minimally distended. Stomach/Bowel: Patulous distal esophagus. Unremarkable stomach. Normal positioning  of the duodenum and ligament of Treitz. Normal small bowel without obstruction or inflammation. Normal appendix. Moderate colonic stool burden without colonic wall thickening or inflammation. Vascular/Lymphatic: Normal caliber abdominal aorta. Mild aortic atherosclerosis, advanced for age. No enlarged lymph nodes in the abdomen or pelvis. Reproductive: Uterus and bilateral adnexa are unremarkable. Other: Fluid collection in the gallbladder fossa has resolved after percutaneous drainage. No new intra-abdominal collection. No free fluid. No free air. Postsurgical change of the anterior abdominal wall without subcutaneous collection. Musculoskeletal: There are no acute or suspicious osseous abnormalities. IMPRESSION: 1. Percutaneous drainage catheter in the gallbladder fossa with resolved fluid collection. 2. Biliary stent in place decompressing the biliary tree. No intra or extrahepatic biliary ductal dilatation. 3. Faint peripancreatic stranding about the pancreatic head, can be seen with acute pancreatitis. Recommend correlation with pancreatic enzymes. 4. Bilateral nonobstructing nephrolithiasis. 5. Mild aortic atherosclerosis is age advanced. Aortic Atherosclerosis (ICD10-I70.0). Electronically Signed   By: Narda Rutherford M.D.   On: 05/21/2020 23:46   DG Abdomen 1 View  Result Date: 05/21/2020 CLINICAL DATA:  Abdominal pain. Cholecystectomy 12 days ago, recent biliary stent placement. EXAM: ABDOMEN - 1 VIEW COMPARISON:  CT 05/13/2020 FINDINGS: Portable supine views of the abdomen obtained. Right upper quadrant biliary stent in place. Right upper quadrant drainage catheter in place. Surgical clips from cholecystectomy. No bowel dilatation or evidence of obstruction. Small to moderate colonic stool burden. Intrarenal calculi on recent CT are partially obscured by overlying stool. C shaped 2 cm small bore catheter projects over the left sacrum, unclear if this is external to the patient. Low lung volumes  without significant pleural effusion or focal airspace disease. IMPRESSION: 1. Right upper quadrant biliary stent in place. Right upper quadrant drainage catheter in place. 2. Small bore C-shaped density projecting over the left pelvis is of unknown etiology, may be external to the patient. 3. Normal bowel gas pattern. Electronically Signed   By: Narda Rutherford M.D.   On: 05/21/2020 20:28    Review of Systems negative except above Blood pressure 103/60, pulse 78, temperature 98.2 F (36.8 C), temperature source Oral, resp. rate 16, height 5\' 8"  (1.727 m), weight 103.9 kg, last menstrual period 07/23/2019, SpO2 100 %, currently breastfeeding. Physical Exam patient in mild pain distress certainly uncomfortable abdomen is soft some discomfort more in the upper than the lower no rebound white count decreased today hemoglobin stable liver tests lipase  normal today BUN/creatinine normal  Assessment/Plan: After everything was thoroughly reviewed I think she has a persistent bile leak despite no fluid on the CT but based on persistent drainage of bilious material from her drain and will go ahead and repeat a nuclear HIDA scan to confirm the leak and if so she will need her stent replaced and hopefully enlarged to a 10 [place] and that tentative diagnosis and if so plan was thoroughly discussed with the family and the patient and we discussed the reasons for placing a pancreatic stent which is no longer present on x-rays  [facility] 05/22/2020, 1:00 PM

## 2020-05-23 DIAGNOSIS — R1013 Epigastric pain: Secondary | ICD-10-CM | POA: Diagnosis not present

## 2020-05-23 DIAGNOSIS — D72829 Elevated white blood cell count, unspecified: Secondary | ICD-10-CM | POA: Diagnosis not present

## 2020-05-23 DIAGNOSIS — D649 Anemia, unspecified: Secondary | ICD-10-CM | POA: Diagnosis not present

## 2020-05-23 DIAGNOSIS — K839 Disease of biliary tract, unspecified: Secondary | ICD-10-CM | POA: Diagnosis not present

## 2020-05-23 LAB — COMPREHENSIVE METABOLIC PANEL
ALT: 14 U/L (ref 0–44)
AST: 10 U/L — ABNORMAL LOW (ref 15–41)
Albumin: 3 g/dL — ABNORMAL LOW (ref 3.5–5.0)
Alkaline Phosphatase: 70 U/L (ref 38–126)
Anion gap: 6 (ref 5–15)
BUN: 5 mg/dL — ABNORMAL LOW (ref 6–20)
CO2: 30 mmol/L (ref 22–32)
Calcium: 8.7 mg/dL — ABNORMAL LOW (ref 8.9–10.3)
Chloride: 104 mmol/L (ref 98–111)
Creatinine, Ser: 0.65 mg/dL (ref 0.44–1.00)
GFR, Estimated: >60 mL/min (ref >60)
Glucose, Bld: 95 mg/dL (ref 70–99)
Potassium: 3.9 mmol/L (ref 3.5–5.1)
Sodium: 140 mmol/L (ref 135–145)
Total Bilirubin: 1.1 mg/dL (ref 0.3–1.2)
Total Protein: 5.8 g/dL — ABNORMAL LOW (ref 6.5–8.1)

## 2020-05-23 LAB — CBC WITH DIFFERENTIAL/PLATELET
Abs Immature Granulocytes: 0.02 10*3/uL (ref 0.00–0.07)
Basophils Absolute: 0 10*3/uL (ref 0.0–0.1)
Basophils Relative: 0 %
Eosinophils Absolute: 0.2 10*3/uL (ref 0.0–0.5)
Eosinophils Relative: 2 %
HCT: 31.7 % — ABNORMAL LOW (ref 36.0–46.0)
Hemoglobin: 9.6 g/dL — ABNORMAL LOW (ref 12.0–15.0)
Immature Granulocytes: 0 %
Lymphocytes Relative: 31 %
Lymphs Abs: 3.1 10*3/uL (ref 0.7–4.0)
MCH: 24.6 pg — ABNORMAL LOW (ref 26.0–34.0)
MCHC: 30.3 g/dL (ref 30.0–36.0)
MCV: 81.1 fL (ref 80.0–100.0)
Monocytes Absolute: 0.7 10*3/uL (ref 0.1–1.0)
Monocytes Relative: 7 %
Neutro Abs: 6.1 10*3/uL (ref 1.7–7.7)
Neutrophils Relative %: 60 %
Platelets: 270 10*3/uL (ref 150–400)
RBC: 3.91 MIL/uL (ref 3.87–5.11)
RDW: 17.9 % — ABNORMAL HIGH (ref 11.5–15.5)
WBC: 10 10*3/uL (ref 4.0–10.5)
nRBC: 0 % (ref 0.0–0.2)

## 2020-05-23 LAB — IRON AND TIBC
Iron: 28 ug/dL (ref 28–170)
Saturation Ratios: 8 % — ABNORMAL LOW (ref 10.4–31.8)
TIBC: 351 ug/dL (ref 250–450)
UIBC: 323 ug/dL

## 2020-05-23 LAB — VITAMIN B12: Vitamin B-12: 318 pg/mL (ref 180–914)

## 2020-05-23 LAB — MAGNESIUM: Magnesium: 2 mg/dL (ref 1.7–2.4)

## 2020-05-23 LAB — PREALBUMIN: Prealbumin: 17.5 mg/dL — ABNORMAL LOW (ref 18–38)

## 2020-05-23 LAB — FERRITIN: Ferritin: 49 ng/mL (ref 11–307)

## 2020-05-23 LAB — FOLATE: Folate: 18.7 ng/mL (ref >5.9)

## 2020-05-23 LAB — PHOSPHORUS: Phosphorus: 4.7 mg/dL — ABNORMAL HIGH (ref 2.5–4.6)

## 2020-05-23 MED ORDER — METOCLOPRAMIDE HCL 5 MG/ML IJ SOLN: 5.0000 mg | Freq: Three times a day (TID) | INTRAMUSCULAR | Status: DC | PRN

## 2020-05-23 MED ORDER — HYDROMORPHONE HCL 1 MG/ML IJ SOLN
0.5000 mg | INTRAMUSCULAR | Status: DC | PRN
  Administered 2020-05-23: 0.5 mg via INTRAVENOUS
  Filled 2020-05-23: qty 1

## 2020-05-23 MED ORDER — CALCIUM POLYCARBOPHIL 625 MG PO TABS
625.0000 mg | ORAL_TABLET | Freq: Two times a day (BID) | ORAL | Status: DC
  Administered 2020-05-23 – 2020-05-25 (×4): 625 mg via ORAL
  Filled 2020-05-23 (×5): qty 1

## 2020-05-23 MED ORDER — LIP MEDEX EX OINT
1.0000 "application " | TOPICAL_OINTMENT | Freq: Two times a day (BID) | CUTANEOUS | Status: DC
  Administered 2020-05-23 – 2020-05-25 (×4): 1 via TOPICAL

## 2020-05-23 MED ORDER — ONDANSETRON HCL 4 MG/2ML IJ SOLN: 4.0000 mg | Freq: Four times a day (QID) | INTRAMUSCULAR | Status: DC | PRN

## 2020-05-23 MED ORDER — ALUM & MAG HYDROXIDE-SIMETH 200-200-20 MG/5ML PO SUSP: 30.0000 mL | Freq: Four times a day (QID) | ORAL | Status: DC | PRN

## 2020-05-23 MED ORDER — LACTATED RINGERS IV BOLUS: 1000.0000 mL | Freq: Three times a day (TID) | INTRAVENOUS | Status: DC | PRN

## 2020-05-23 MED ORDER — MAGIC MOUTHWASH
15.0000 mL | Freq: Four times a day (QID) | ORAL | Status: DC | PRN
  Filled 2020-05-23: qty 15

## 2020-05-23 MED ORDER — METHOCARBAMOL 1000 MG/10ML IJ SOLN
1000.0000 mg | Freq: Four times a day (QID) | INTRAVENOUS | Status: DC | PRN
  Filled 2020-05-23: qty 10

## 2020-05-23 MED ORDER — METOPROLOL TARTRATE 5 MG/5ML IV SOLN: 5.0000 mg | Freq: Four times a day (QID) | INTRAVENOUS | Status: DC | PRN

## 2020-05-23 MED ORDER — MENTHOL 3 MG MT LOZG
1.0000 | LOZENGE | OROMUCOSAL | Status: DC | PRN
  Filled 2020-05-23: qty 9

## 2020-05-23 MED ORDER — SODIUM CHLORIDE 0.9 % IV SOLN
8.0000 mg | Freq: Four times a day (QID) | INTRAVENOUS | Status: DC | PRN
  Filled 2020-05-23: qty 4

## 2020-05-23 MED ORDER — SIMETHICONE 40 MG/0.6ML PO SUSP
80.0000 mg | Freq: Four times a day (QID) | ORAL | Status: DC | PRN
  Filled 2020-05-23: qty 1.2

## 2020-05-23 MED ORDER — BISACODYL 10 MG RE SUPP: 10.0000 mg | Freq: Two times a day (BID) | RECTAL | Status: DC | PRN

## 2020-05-23 MED ORDER — PHENOL 1.4 % MT LIQD
2.0000 | OROMUCOSAL | Status: DC | PRN
  Filled 2020-05-23: qty 177

## 2020-05-23 NOTE — Progress Notes (Signed)
[facility] Progress Note  Jordan Chung 30 y.o. 05-09-1990  CC:  Bile leak  Subjective: Patient reports continued epigastric and RUQ pain, though slightly improved since admission (at which point pain was 10/10).  No nausea/vomiting today, but she did have nausea/vomiting yesterday.  ROS : Review of Systems  Respiratory: Negative for cough and shortness of breath.   Gastrointestinal: Positive for nausea. Negative for abdominal pain, blood in stool, constipation, diarrhea, heartburn, melena and vomiting.   Objective: Vital signs in last 24 hours: Vitals:   05/23/20 0515 05/23/20 0730  BP: (!) 90/47 (!) 100/56  Pulse: (!) 53 66  Resp: 14   Temp: 98.4 F (36.9 C)   SpO2: 100%     Physical Exam:  General:  Alert, oriented, cooperative, no distress  Head:  Normocephalic, without obvious abnormality, atraumatic  Eyes:  Anicteric sclera, EOMs intact  Lungs:   Clear to auscultation bilaterally, respirations unlabored  Heart:  Regular rate and rhythm, S1, S2 normal  Abdomen:   Soft but moderately tender to palpation of epigastrium and RUQ, bowel sounds active all four quadrants,  no peritoneal signs  Extremities: Extremities normal, atraumatic, no  edema  Pulses: 2+ and symmetric    Lab Results: Recent Labs    05/22/20 0629 05/23/20 0539  NA 136 140  K 3.6 3.9  CL 108 104  CO2 25 30  GLUCOSE 110* 95  BUN 9 5*  CREATININE 0.63 0.65  CALCIUM 8.1* 8.7*  MG 1.9 2.0   Recent Labs    05/22/20 0629 05/23/20 0539  AST 12* 10*  ALT 16 14  ALKPHOS 74 70  BILITOT 0.7 1.1  PROT 5.9* 5.8*  ALBUMIN 3.2* 3.0*   Recent Labs    05/22/20 0629 05/23/20 0539  WBC 13.9* 10.0  NEUTROABS 10.1* 6.1  HGB 9.7* 9.6*  HCT 32.4* 31.7*  MCV 81.0 81.1  PLT 256 270   No results for input(s): LABPROT, INR in the last 72 hours.  Assessment: Bile leak: HIDA 5/15 a small persistent contained leak is suspected.  -Normal LFTs: T. Bili 1.1/ AST 10/ ALT 14/ ALP 70 -Leukocytosis,  resolved (WBCs 10.0, decreased from 19.1 two days ago)  Plan: ERCP with replacement of CBD stent tomorrow.  I thoroughly discussed the procedure with the patient to include nature, alternatives, benefits, and risks (including but not limited to pancreatitis, bleeding, infection, perforation, anesthesia/cardiac and pulmonary complications).  Patient verbalized understanding and gave verbal consent to proceed with ERCP.  Clear liquid diet, NPO at midnight.  [facility] will follow.  Edrick Kins PA-C 05/23/2020, 10:01 AM  Contact #  [phone]

## 2020-05-23 NOTE — Plan of Care (Signed)
  Problem: Education: Goal: Knowledge of General Education information will improve Description: Including pain rating scale, medication(s)/side effects and non-pharmacologic comfort measures Outcome: Progressing   Problem: Health Behavior/Discharge Planning: Goal: Ability to manage health-related needs will improve Outcome: Progressing   Problem: Clinical Measurements: Goal: Respiratory complications will improve Outcome: Progressing   Problem: Pain Managment: Goal: General experience of comfort will improve Outcome: Progressing   Problem: Safety: Goal: Ability to remain free from injury will improve Outcome: Progressing   

## 2020-05-23 NOTE — Progress Notes (Addendum)
PROGRESS NOTE    Jordan Chung  OVA:[id number] DOB: 11/26/1990 DOA: 05/21/2020 PCP: Doris Cheadle    Chief Complaint  Patient presents with  . Abdominal Pain    Brief Narrative:  Patient is a pleasant 30 year old female, recent laparoscopic cholecystectomy 05/09/2020 complicated by postop bile leak subsequently underwent CT guided biloma drain placement.  On 05/15/2020 patient underwent second ERCP with sphincterotomy and stent placement per Dr. [name] and subsequently discharged home 05/17/2020 presented to the ED with worsening epigastric and right upper quadrant abdominal pain.  CT abdomen and pelvis with some pancreatic inflammation, elevated total bilirubin of 1.3, lipase level of 47 down from 173 ( 05/16/2020).  Patient also noted to have a leukocytosis white count of 19.1.  General surgery consulted and recommended medicine admission with general surgical consultation. -Patient admitted placed on aggressive fluid resuscitation, pain management for post ERCP pancreatitis.   Assessment & Plan:   Principal Problem:   Bile leak Active Problems:   Abdominal pain   Acute pancreatitis   Anemia   SOB (shortness of breath)   Leukocytosis   S/P laparoscopic cholecystectomy  1 abdominal pain secondary to ongoing biliary leak/less likely post ERCP pancreatitis. -Patient presented with epigastric and right upper quadrant abdominal pain after recent hospitalization for postop bile leak status post ERCP with sphincterotomy and stent placement 05/15/2020. -CT abdomen and pelvis done on presentation with concern for some pancreatic inflammation/acute pancreatitis.  Bilirubin noted to be at 1.3.  Lipase level of 47 down from 173(05/16/2020).  Patient also with a leukocytosis white count of 19,000 which is trending down to. -Patient seen in consultation by general surgery who are recommending GI consultation. -CT abdomen and pelvis with resolution of fluid in gallbladder fossa, drain still with  consistent output. -Patient seen in consultation by gastroenterology who are concerned for ongoing biliary leak and as such HIDA scan ordered.   -HIDA scan small focus of abnormal uptake in the gallbladder fossa region concerning for persistent contained leak.  Patency of CBD. -Patient likely may require another ERCP however to be determined per GI and general surgery.  -Pain is much better controlled today.  -Dilaudid PCA pump has been discontinued -Continue current pain regimen of Dilaudid 2 mg IV every 2 hours as needed severe pain, Toradol 30 mg IV every 6 hours as needed moderate pain -Patient pancultured results pending.  -Continue IV Merrem -Continue Senokot-S twice daily. -Currently on bowel rest. -Decrease IV fluids to normal saline at 125 cc an hour -Per GI and general surgery  2.  Shortness of breath -Patient with shortness of breath yesterday felt secondary to abdominal pain secondary to biliary leakage  -Chest x-ray unremarkable.   -Clinical improvement.   -Continue current pain regimen of IV Dilaudid as needed.   -Supportive care.  3.  Recent cholecystectomy complicated by bile leak/status post CT-guided bowel drain placement. -Per general surgery.  4.  Leukocytosis -Likely reactive secondary to #1. -Leukocytosis trending down.  -Chest x-ray negative for any acute infiltrates.  -Urinalysis nitrite negative, leukocytes negative.  -Blood cultures pending with no growth to date. -Continue empiric IV Merrem.    5.  Anemia -Likely iron deficiency anemia and menstruating female.  Patient status post recent childbirth. -Anemia panel with iron level of 28, ferritin of 49, folate of 18.7, vitamin B12 of 318.  -Hemoglobin currently stable at 9.6.  -Transfusion threshold hemoglobin < 7.  -Follow H&H.   DVT prophylaxis: Lovenox Code Status: Full Family Communication: Updated patient.  No family  at bedside.  Disposition:   Status is: Inpatient    Dispo: The patient  is from: Home              Anticipated d/c is to: Home              Patient currently with ongoing bowel leakage, also concern for possible acute post ERCP pancreatitis with significant abdominal pain, on IV fluids, requiring IV pain medication, for probable ERCP tomorrow, not stable for discharge   Difficult to place patient: No       Consultants:   General surgery: Dr. Magnus Ivan 05/22/2020  Gastroenterology: Dr.Magod 05/22/2020  Procedures:   CT abdomen and pelvis 05/21/2020  HIDA scan 05/22/2020  Chest x-ray 05/22/2020  Antimicrobials:   IV Merrem 05/22/2020>>>>   Subjective: Patient sitting up in bed.  Feeling much better than she did yesterday.  States abdominal pain has improved and currently better controlled and rates the pain 5/10.  No nausea or emesis.  No chest pain.  No shortness of breath.  Stated saw both general surgery and GI today who are planning on probable ERCP tomorrow.  Objective: Vitals:   05/22/20 1757 05/22/20 2007 05/23/20 0515 05/23/20 0730  BP: 137/63 (!) 111/58 (!) 90/47 (!) 100/56  Pulse: 85 91 (!) 53 66  Resp:  18 14   Temp: 99.4 F (37.4 C) 97.7 F (36.5 C) 98.4 F (36.9 C)   TempSrc: Oral Oral Oral   SpO2: 96% 100% 100%   Weight:      Height:        Intake/Output Summary (Last 24 hours) at 05/23/2020 1103 Last data filed at 05/23/2020 1021 Gross per 24 hour  Intake 1766.91 ml  Output 4527 ml  Net -2760.09 ml   Filed Weights   05/21/20 1926  Weight: 103.9 kg    Examination:  General exam: No acute distress. Respiratory system: Lungs clear to auscultation bilaterally.  No wheezes, no crackles, no rhonchi.  Normal respiratory effort.  Cardiovascular system: Regular rate rhythm no murmurs rubs or gallops.  No JVD.  No lower extremity edema.  Gastrointestinal system: Abdomen is soft, nondistended, tenderness to palpation in the epigastrium and right upper quadrant.  Pain around drain site.  Right JP drain with bilious drainage noted.   Positive bowel sounds.  No rebound.  No guarding.  Incision sites c/d/i. Central nervous system: Alert and oriented.  Moving extremities spontaneously.  No focal neurological deficits. Extremities: Symmetric 5 x 5 power. Skin: No rashes, lesions or ulcers Psychiatry: Judgement and insight appear normal. Mood & affect appropriate.     Data Reviewed: I have personally reviewed following labs and imaging studies  CBC: Recent Labs  Lab 05/21/20 1958 05/22/20 0629 05/23/20 0539  WBC 19.1* 13.9* 10.0  NEUTROABS 14.8* 10.1* 6.1  HGB 11.4* 9.7* 9.6*  HCT 37.8 32.4* 31.7*  MCV 80.4 81.0 81.1  PLT 350 256 270    Basic Metabolic Panel: Recent Labs  Lab 05/21/20 1958 05/22/20 0629 05/23/20 0539  NA 139 136 140  K 4.9 3.6 3.9  CL 108 108 104  CO2 21* 25 30  GLUCOSE 78 110* 95  BUN 13 9 5*  CREATININE 0.83 0.63 0.65  CALCIUM 8.8* 8.1* 8.7*  MG  --  1.9 2.0  PHOS  --   --  4.7*    GFR: Estimated Creatinine Clearance: 127.3 mL/min (by C-G formula based on SCr of 0.65 mg/dL).  Liver Function Tests: Recent Labs  Lab 05/21/20 1958 05/22/20 8366  05/23/20 0539  AST 30 12* 10*  ALT 19 16 14   ALKPHOS 80 74 70  BILITOT 1.3* 0.7 1.1  PROT 6.8 5.9* 5.8*  ALBUMIN 3.7 3.2* 3.0*    CBG: Recent Labs  Lab 05/22/20 2351  GLUCAP 105*     Recent Results (from the past 240 hour(s))  Aerobic/Anaerobic Culture (surgical/deep wound)     Status: None   Collection Time: 05/14/20  2:28 PM   Specimen: Gallbladder; Abscess  Result Value Ref Range Status   Specimen Description   Final    GALL BLADDER Performed at [facility], [address] [place], [place]    Special Requests   Final    ABSCESS Performed at [facility], [address] [place], [place]    Gram Stain   Final    RARE WBC PRESENT, PREDOMINANTLY PMN NO ORGANISMS SEEN    Culture   Final    No growth aerobically or anaerobically. Performed at [facility], [address] [place], [place]    Report Status 05/19/2020 FINAL  Final  Resp Panel by RT-PCR (Flu A&B, Covid) Nasopharyngeal Swab     Status: None   Collection Time: 05/21/20  9:59 PM   Specimen: Nasopharyngeal Swab; Nasopharyngeal(NP) swabs in vial transport medium  Result Value Ref Range Status   SARS Coronavirus 2 by RT PCR NEGATIVE NEGATIVE Final    Comment: (NOTE) SARS-CoV-2 target nucleic acids are NOT DETECTED.  The SARS-CoV-2 RNA is generally detectable in upper respiratory specimens during the acute phase of infection. The lowest concentration of SARS-CoV-2 viral copies this assay can detect is 138 copies/mL. A negative result does not preclude SARS-Cov-2 infection and should not be used as the sole basis for treatment or other patient management decisions. A negative result may occur with  improper specimen collection/handling, submission of specimen other than nasopharyngeal swab, presence of viral mutation(s) within the areas targeted by this assay, and inadequate number of viral copies(<138 copies/mL). A negative result must be combined with clinical observations, patient history, and epidemiological information. The expected result is Negative.  Fact Sheet for Patients:  05/23/20  Fact Sheet for Healthcare Providers:  BloggerCourse.com  This test is no t yet approved or cleared by the [employer] FDA and  has been authorized for detection and/or diagnosis of SARS-CoV-2 by FDA under an Emergency Use Authorization (EUA). This EUA will remain  in effect (meaning this test can be used) for the duration of the COVID-19 declaration under Section 564(b)(1) of the Act, 21 U.S.C.section 360bbb-3(b)(1), unless the authorization is terminated  or revoked sooner.       Influenza A by PCR NEGATIVE NEGATIVE Final   Influenza B by PCR NEGATIVE NEGATIVE Final    Comment: (NOTE) The Xpert Xpress  SARS-CoV-2/FLU/RSV plus assay is intended as an aid in the diagnosis of influenza from Nasopharyngeal swab specimens and should not be used as a sole basis for treatment. Nasal washings and aspirates are unacceptable for Xpert Xpress SARS-CoV-2/FLU/RSV testing.  Fact Sheet for Patients: [place]  Fact Sheet for Healthcare Providers: BloggerCourse.com  This test is not yet approved or cleared by the [employer] FDA and has been authorized for detection and/or diagnosis of SARS-CoV-2 by FDA under an Emergency Use Authorization (EUA). This EUA will remain in effect (meaning this test can be used) for the duration of the COVID-19 declaration under Section 564(b)(1) of the Act, 21 U.S.C. section 360bbb-3(b)(1), unless the authorization  is terminated or revoked.  Performed at [facility], [address] [place], [place] [address]          Radiology Studies: CT Abdomen Pelvis Wo Contrast  Result Date: 05/21/2020 CLINICAL DATA:  Abdominal pain and fever. Postop. Recent cholecystectomy complicated by bile leak with biliary stenting and percutaneous drainage. EXAM: CT ABDOMEN AND PELVIS WITHOUT CONTRAST TECHNIQUE: Multidetector CT imaging of the abdomen and pelvis was performed following the standard protocol without IV contrast. COMPARISON:  Most recent CT 05/13/2020 FINDINGS: Lower chest: Mild lower lobe atelectasis.  No pleural fluid. Hepatobiliary: Biliary stent in place decompressing the biliary tree. No intra or extrahepatic biliary ductal dilatation. Drainage catheter in the gallbladder fossa with resolution of previous fluid collection. No inflammation along the course of the catheter. No new hepatic abnormality. Pancreas: Faint peripancreatic stranding about the pancreatic head, series 2, image 34. No ductal dilatation. No acute pancreatic collection. Spleen: Normal in size without focal abnormality.  Adrenals/Urinary Tract: Normal adrenal glands. Bilateral intrarenal calculi without hydronephrosis or perinephric edema. No ureteral stones. No perinephric edema. Urinary bladder is minimally distended. Stomach/Bowel: Patulous distal esophagus. Unremarkable stomach. Normal positioning of the duodenum and ligament of Treitz. Normal small bowel without obstruction or inflammation. Normal appendix. Moderate colonic stool burden without colonic wall thickening or inflammation. Vascular/Lymphatic: Normal caliber abdominal aorta. Mild aortic atherosclerosis, advanced for age. No enlarged lymph nodes in the abdomen or pelvis. Reproductive: Uterus and bilateral adnexa are unremarkable. Other: Fluid collection in the gallbladder fossa has resolved after percutaneous drainage. No new intra-abdominal collection. No free fluid. No free air. Postsurgical change of the anterior abdominal wall without subcutaneous collection. Musculoskeletal: There are no acute or suspicious osseous abnormalities. IMPRESSION: 1. Percutaneous drainage catheter in the gallbladder fossa with resolved fluid collection. 2. Biliary stent in place decompressing the biliary tree. No intra or extrahepatic biliary ductal dilatation. 3. Faint peripancreatic stranding about the pancreatic head, can be seen with acute pancreatitis. Recommend correlation with pancreatic enzymes. 4. Bilateral nonobstructing nephrolithiasis. 5. Mild aortic atherosclerosis is age advanced. Aortic Atherosclerosis (ICD10-I70.0). Electronically Signed   By: Narda RutherfordMelanie  Sanford M.D.   On: 05/21/2020 23:46   DG Abdomen 1 View  Result Date: 05/21/2020 CLINICAL DATA:  Abdominal pain. Cholecystectomy 12 days ago, recent biliary stent placement. EXAM: ABDOMEN - 1 VIEW COMPARISON:  CT 05/13/2020 FINDINGS: Portable supine views of the abdomen obtained. Right upper quadrant biliary stent in place. Right upper quadrant drainage catheter in place. Surgical clips from cholecystectomy. No bowel  dilatation or evidence of obstruction. Small to moderate colonic stool burden. Intrarenal calculi on recent CT are partially obscured by overlying stool. C shaped 2 cm small bore catheter projects over the left sacrum, unclear if this is external to the patient. Low lung volumes without significant pleural effusion or focal airspace disease. IMPRESSION: 1. Right upper quadrant biliary stent in place. Right upper quadrant drainage catheter in place. 2. Small bore C-shaped density projecting over the left pelvis is of unknown etiology, may be external to the patient. 3. Normal bowel gas pattern. Electronically Signed   By: Narda RutherfordMelanie  Sanford M.D.   On: 05/21/2020 20:28   DG CHEST PORT 1 VIEW  Result Date: 05/22/2020 CLINICAL DATA:  Increased shortness of breath. EXAM: PORTABLE CHEST 1 VIEW COMPARISON:  05/13/2020 FINDINGS: There is stable elevation of the RIGHT hemidiaphragm. Heart size is accentuated by technique. There are scattered areas of subsegmental atelectasis. No consolidations or pulmonary edema. IMPRESSION: Shallow inflation.  Subsegmental atelectasis. Electronically Signed  By: Norva Pavlov M.D.   On: 05/22/2020 14:25   NM HEPATOBILIARY LEAK (POST-SURGICAL)  Result Date: 05/22/2020 CLINICAL DATA:  Status post cholecystectomy with previously suspected biliary leak EXAM: NUCLEAR MEDICINE HEPATOBILIARY IMAGING TECHNIQUE: Sequential images of the abdomen were obtained out to 120 minutes following intravenous administration of radiopharmaceutical. Note that biliary drain clamped for this study. RADIOPHARMACEUTICALS:  5.4 mCi Tc-17m  Choletec IV COMPARISON:  May 13, 2020 nuclear medicine hepatobiliary imaging study; ERCP May 15, 2020. CT abdomen and pelvis May 21, 2020 FINDINGS: Liver uptake of radiotracer is normal. There is prompt visualization of small bowel indicating patency of the common bile duct. There is a small focus of radiotracer uptake in the region of the gallbladder fossa, similar to  findings on the previous study. No other areas of abnormal radiotracer uptake noted. IMPRESSION: Persistent small focus of abnormal uptake in the gallbladder fossa region. A small persistent contained leak is suspected. Contrast in a gallbladder remnant could present in this manner. Note that no gallbladder remnant evident on recent ERCP examination, supporting small residual contained biliary leak in the gallbladder fossa as a more likely differential consideration. Study otherwise unremarkable. Note patency of the common bile duct. Electronically Signed   By: Bretta Bang III M.D.   On: 05/22/2020 17:55        Scheduled Meds: . enoxaparin (LOVENOX) injection  40 mg Subcutaneous Q24H  . lip balm  1 application Topical BID  . polycarbophil  625 mg Oral BID  . senna-docusate  1 tablet Oral BID   Continuous Infusions: . lactated ringers    . lactated ringers 125 mL/hr at 05/23/20 0851  . meropenem (MERREM) IV 1 g (05/23/20 0522)  . methocarbamol (ROBAXIN) IV    . ondansetron (ZOFRAN) IV       LOS: 1 day    Time spent: 40 minutes    Ramiro Harvest, MD [facility]   To contact the attending provider between 7A-7P or the covering provider during after hours 7P-7A, please log into the web site [email or web address] and access using universal [facility] password for that web site. If you do not have the password, please call the hospital operator.  05/23/2020, 11:03 AM

## 2020-05-23 NOTE — Anesthesia Preprocedure Evaluation (Addendum)
Anesthesia Evaluation  Patient identified by MRN, date of birth, ID band Patient awake    Reviewed: Allergy & Precautions, NPO status , Patient's Chart, lab work & pertinent test results  Airway Mallampati: II  TM Distance: >3 FB Neck ROM: Full    Dental no notable dental hx. (+) Teeth Intact, Dental Advisory Given   Pulmonary neg pulmonary ROS,    Pulmonary exam normal breath sounds clear to auscultation       Cardiovascular Exercise Tolerance: Good negative cardio ROS Normal cardiovascular exam Rhythm:Regular Rate:Normal     Neuro/Psych Depression negative neurological ROS     GI/Hepatic negative GI ROS, Neg liver ROS,   Endo/Other  negative endocrine ROS  Renal/GU negative Renal ROS     Musculoskeletal negative musculoskeletal ROS (+)   Abdominal (+) + obese,   Peds  Hematology  (+) anemia , Lab Results      Component                Value               Date                      WBC                      10.0                05/23/2020                HGB                      9.6 (L)             05/23/2020                HCT                      31.7 (L)            05/23/2020                MCV                      81.1                05/23/2020                PLT                      270                 05/23/2020              Anesthesia Other Findings   Reproductive/Obstetrics negative OB ROS                            Anesthesia Physical Anesthesia Plan  ASA: II  Anesthesia Plan: General   Post-op Pain Management:    Induction: Intravenous  PONV Risk Score and Plan: 3 and Treatment may vary due to age or medical condition, Midazolam and Ondansetron  Airway Management Planned: Oral ETT  Additional Equipment: None  Intra-op Plan:   Post-operative Plan: Extubation in OR  Informed Consent: I have reviewed the patients History and Physical, chart, labs and discussed the  procedure including the risks, benefits and alternatives for the proposed anesthesia with the  patient or authorized representative who has indicated his/her understanding and acceptance.     Dental advisory given  Plan Discussed with:   Anesthesia Plan Comments:        Anesthesia Quick Evaluation

## 2020-05-23 NOTE — Progress Notes (Signed)
Subjective: Pain is much better today so far.  No other complaints.  Been getting average of 45-60cc of bilious fluid at home  ROS: See above, otherwise other systems negative  Objective: Vital signs in last 24 hours: Temp:  [97.7 F (36.5 C)-99.4 F (37.4 C)] 98.4 F (36.9 C) (05/16 0515) Pulse Rate:  [53-91] 66 (05/16 0730) Resp:  [14-18] 14 (05/16 0515) BP: (90-137)/(47-63) 100/56 (05/16 0730) SpO2:  [96 %-100 %] 100 % (05/16 0515) Last BM Date: 05/21/20  Intake/Output from previous day: 05/15 0701 - 05/16 0700 In: 3683.4 [I.V.:3483.4; IV Piggyback:200] Out: 4359 [Urine:4301; Emesis/NG output:3; Drains:55] Intake/Output this shift: Total I/O In: -  Out: 600 [Urine:600]  PE: Heart: regular Lungs: CTAB Abd: soft, but with diffuse tenderness, greatest in epigastrium.  JP drain with bilious output.   Lab Results:  Recent Labs    05/22/20 0629 05/23/20 0539  WBC 13.9* 10.0  HGB 9.7* 9.6*  HCT 32.4* 31.7*  PLT 256 270   BMET Recent Labs    05/22/20 0629 05/23/20 0539  NA 136 140  K 3.6 3.9  CL 108 104  CO2 25 30  GLUCOSE 110* 95  BUN 9 5*  CREATININE 0.63 0.65  CALCIUM 8.1* 8.7*   PT/INR No results for input(s): LABPROT, INR in the last 72 hours. CMP     Component Value Date/Time   NA 140 05/23/2020 0539   K 3.9 05/23/2020 0539   CL 104 05/23/2020 0539   CO2 30 05/23/2020 0539   GLUCOSE 95 05/23/2020 0539   BUN 5 (L) 05/23/2020 0539   CREATININE 0.65 05/23/2020 0539   CALCIUM 8.7 (L) 05/23/2020 0539   PROT 5.8 (L) 05/23/2020 0539   ALBUMIN 3.0 (L) 05/23/2020 0539   AST 10 (L) 05/23/2020 0539   ALT 14 05/23/2020 0539   ALKPHOS 70 05/23/2020 0539   BILITOT 1.1 05/23/2020 0539   GFRNONAA >60 05/23/2020 0539   GFRAA >60 10/05/2019 2227   Lipase     Component Value Date/Time   LIPASE 21 05/22/2020 0629       Studies/Results: CT Abdomen Pelvis Wo Contrast  Result Date: 05/21/2020 CLINICAL DATA:  Abdominal pain and fever.  Postop. Recent cholecystectomy complicated by bile leak with biliary stenting and percutaneous drainage. EXAM: CT ABDOMEN AND PELVIS WITHOUT CONTRAST TECHNIQUE: Multidetector CT imaging of the abdomen and pelvis was performed following the standard protocol without IV contrast. COMPARISON:  Most recent CT 05/13/2020 FINDINGS: Lower chest: Mild lower lobe atelectasis.  No pleural fluid. Hepatobiliary: Biliary stent in place decompressing the biliary tree. No intra or extrahepatic biliary ductal dilatation. Drainage catheter in the gallbladder fossa with resolution of previous fluid collection. No inflammation along the course of the catheter. No new hepatic abnormality. Pancreas: Faint peripancreatic stranding about the pancreatic head, series 2, image 34. No ductal dilatation. No acute pancreatic collection. Spleen: Normal in size without focal abnormality. Adrenals/Urinary Tract: Normal adrenal glands. Bilateral intrarenal calculi without hydronephrosis or perinephric edema. No ureteral stones. No perinephric edema. Urinary bladder is minimally distended. Stomach/Bowel: Patulous distal esophagus. Unremarkable stomach. Normal positioning of the duodenum and ligament of Treitz. Normal small bowel without obstruction or inflammation. Normal appendix. Moderate colonic stool burden without colonic wall thickening or inflammation. Vascular/Lymphatic: Normal caliber abdominal aorta. Mild aortic atherosclerosis, advanced for age. No enlarged lymph nodes in the abdomen or pelvis. Reproductive: Uterus and bilateral adnexa are unremarkable. Other: Fluid collection in the gallbladder fossa has resolved after percutaneous drainage. No new  intra-abdominal collection. No free fluid. No free air. Postsurgical change of the anterior abdominal wall without subcutaneous collection. Musculoskeletal: There are no acute or suspicious osseous abnormalities. IMPRESSION: 1. Percutaneous drainage catheter in the gallbladder fossa with  resolved fluid collection. 2. Biliary stent in place decompressing the biliary tree. No intra or extrahepatic biliary ductal dilatation. 3. Faint peripancreatic stranding about the pancreatic head, can be seen with acute pancreatitis. Recommend correlation with pancreatic enzymes. 4. Bilateral nonobstructing nephrolithiasis. 5. Mild aortic atherosclerosis is age advanced. Aortic Atherosclerosis (ICD10-I70.0). Electronically Signed   By: Narda Rutherford M.D.   On: 05/21/2020 23:46   DG Abdomen 1 View  Result Date: 05/21/2020 CLINICAL DATA:  Abdominal pain. Cholecystectomy 12 days ago, recent biliary stent placement. EXAM: ABDOMEN - 1 VIEW COMPARISON:  CT 05/13/2020 FINDINGS: Portable supine views of the abdomen obtained. Right upper quadrant biliary stent in place. Right upper quadrant drainage catheter in place. Surgical clips from cholecystectomy. No bowel dilatation or evidence of obstruction. Small to moderate colonic stool burden. Intrarenal calculi on recent CT are partially obscured by overlying stool. C shaped 2 cm small bore catheter projects over the left sacrum, unclear if this is external to the patient. Low lung volumes without significant pleural effusion or focal airspace disease. IMPRESSION: 1. Right upper quadrant biliary stent in place. Right upper quadrant drainage catheter in place. 2. Small bore C-shaped density projecting over the left pelvis is of unknown etiology, may be external to the patient. 3. Normal bowel gas pattern. Electronically Signed   By: Narda Rutherford M.D.   On: 05/21/2020 20:28   DG CHEST PORT 1 VIEW  Result Date: 05/22/2020 CLINICAL DATA:  Increased shortness of breath. EXAM: PORTABLE CHEST 1 VIEW COMPARISON:  05/13/2020 FINDINGS: There is stable elevation of the RIGHT hemidiaphragm. Heart size is accentuated by technique. There are scattered areas of subsegmental atelectasis. No consolidations or pulmonary edema. IMPRESSION: Shallow inflation.  Subsegmental  atelectasis. Electronically Signed   By: Norva Pavlov M.D.   On: 05/22/2020 14:25   NM HEPATOBILIARY LEAK (POST-SURGICAL)  Result Date: 05/22/2020 CLINICAL DATA:  Status post cholecystectomy with previously suspected biliary leak EXAM: NUCLEAR MEDICINE HEPATOBILIARY IMAGING TECHNIQUE: Sequential images of the abdomen were obtained out to 120 minutes following intravenous administration of radiopharmaceutical. Note that biliary drain clamped for this study. RADIOPHARMACEUTICALS:  5.4 mCi Tc-37m  Choletec IV COMPARISON:  May 13, 2020 nuclear medicine hepatobiliary imaging study; ERCP May 15, 2020. CT abdomen and pelvis May 21, 2020 FINDINGS: Liver uptake of radiotracer is normal. There is prompt visualization of small bowel indicating patency of the common bile duct. There is a small focus of radiotracer uptake in the region of the gallbladder fossa, similar to findings on the previous study. No other areas of abnormal radiotracer uptake noted. IMPRESSION: Persistent small focus of abnormal uptake in the gallbladder fossa region. A small persistent contained leak is suspected. Contrast in a gallbladder remnant could present in this manner. Note that no gallbladder remnant evident on recent ERCP examination, supporting small residual contained biliary leak in the gallbladder fossa as a more likely differential consideration. Study otherwise unremarkable. Note patency of the common bile duct. Electronically Signed   By: Bretta Bang III M.D.   On: 05/22/2020 17:55    Anti-infectives: Anti-infectives (From admission, onward)   Start     Dose/Rate Route Frequency Ordered Stop   05/22/20 0730  meropenem (MERREM) 1 g in sodium chloride 0.9 % 100 mL IVPB  1 g 200 mL/hr over 30 Minutes Intravenous Every 8 hours 05/22/20 0634         Assessment/Plan POD 14, s/p lap chole by DB on 5/2 with post op bile leak, s/p ERCP now with pancreatitis -HIDA shows persistent small leak.  Per GI likely plan for  repeat ERCP with stent exchange and upsize. -cont NPO for pancreatitis.  Would not advance diet until her pain further improves -cont JP drain until no further bilious output.  Will likely remain for at least another couple of weeks pending output.  FEN - NPO/IVFs VTE - Lovenox ID - merrem, but no indication as WBC likely secondary to reactivity from pancreatitis.  Bile leak controlled. No infectious etiology present.   LOS: 1 day    Letha Cape , [facility] 05/23/2020, 10:34 AM Please see Amion for pager number during day hours 7:00am-4:30pm or 7:00am -11:30am on weekends

## 2020-05-24 ENCOUNTER — Encounter (HOSPITAL_COMMUNITY): Payer: Self-pay | Admitting: Internal Medicine

## 2020-05-24 ENCOUNTER — Inpatient Hospital Stay (HOSPITAL_COMMUNITY): Payer: Medicaid Other | Admitting: Anesthesiology

## 2020-05-24 ENCOUNTER — Inpatient Hospital Stay (HOSPITAL_COMMUNITY): Payer: Medicaid Other

## 2020-05-24 ENCOUNTER — Encounter (HOSPITAL_COMMUNITY)
Admission: EM | Disposition: A | Discharge status: Home / Self Care | Payer: Self-pay | Source: Home / Self Care | Attending: Internal Medicine

## 2020-05-24 DIAGNOSIS — R1013 Epigastric pain: Secondary | ICD-10-CM | POA: Diagnosis not present

## 2020-05-24 DIAGNOSIS — D72829 Elevated white blood cell count, unspecified: Secondary | ICD-10-CM | POA: Diagnosis not present

## 2020-05-24 DIAGNOSIS — K839 Disease of biliary tract, unspecified: Secondary | ICD-10-CM | POA: Diagnosis not present

## 2020-05-24 DIAGNOSIS — D649 Anemia, unspecified: Secondary | ICD-10-CM | POA: Diagnosis not present

## 2020-05-24 HISTORY — PX: BILIARY STENT PLACEMENT: SHX5538

## 2020-05-24 HISTORY — PX: STENT REMOVAL: SHX6421

## 2020-05-24 HISTORY — PX: ERCP: SHX5425

## 2020-05-24 LAB — CBC WITH DIFFERENTIAL/PLATELET
Abs Immature Granulocytes: 0.02 10*3/uL (ref 0.00–0.07)
Basophils Absolute: 0 10*3/uL (ref 0.0–0.1)
Basophils Relative: 0 %
Eosinophils Absolute: 0.2 10*3/uL (ref 0.0–0.5)
Eosinophils Relative: 3 %
HCT: 30.3 % — ABNORMAL LOW (ref 36.0–46.0)
Hemoglobin: 9.3 g/dL — ABNORMAL LOW (ref 12.0–15.0)
Immature Granulocytes: 0 %
Lymphocytes Relative: 43 %
Lymphs Abs: 2.9 10*3/uL (ref 0.7–4.0)
MCH: 24.7 pg — ABNORMAL LOW (ref 26.0–34.0)
MCHC: 30.7 g/dL (ref 30.0–36.0)
MCV: 80.6 fL (ref 80.0–100.0)
Monocytes Absolute: 0.6 10*3/uL (ref 0.1–1.0)
Monocytes Relative: 8 %
Neutro Abs: 3.1 10*3/uL (ref 1.7–7.7)
Neutrophils Relative %: 46 %
Platelets: 264 10*3/uL (ref 150–400)
RBC: 3.76 MIL/uL — ABNORMAL LOW (ref 3.87–5.11)
RDW: 17.6 % — ABNORMAL HIGH (ref 11.5–15.5)
WBC: 6.8 10*3/uL (ref 4.0–10.5)
nRBC: 0 % (ref 0.0–0.2)

## 2020-05-24 LAB — COMPREHENSIVE METABOLIC PANEL
ALT: 13 U/L (ref 0–44)
AST: 13 U/L — ABNORMAL LOW (ref 15–41)
Albumin: 2.9 g/dL — ABNORMAL LOW (ref 3.5–5.0)
Alkaline Phosphatase: 64 U/L (ref 38–126)
Anion gap: 7 (ref 5–15)
BUN: 5 mg/dL — ABNORMAL LOW (ref 6–20)
CO2: 29 mmol/L (ref 22–32)
Calcium: 8.6 mg/dL — ABNORMAL LOW (ref 8.9–10.3)
Chloride: 104 mmol/L (ref 98–111)
Creatinine, Ser: 0.53 mg/dL (ref 0.44–1.00)
GFR, Estimated: >60 mL/min (ref >60)
Glucose, Bld: 99 mg/dL (ref 70–99)
Potassium: 3.8 mmol/L (ref 3.5–5.1)
Sodium: 140 mmol/L (ref 135–145)
Total Bilirubin: 0.6 mg/dL (ref 0.3–1.2)
Total Protein: 5.7 g/dL — ABNORMAL LOW (ref 6.5–8.1)

## 2020-05-24 SURGERY — ERCP, WITH INTERVENTION IF INDICATED
Anesthesia: General

## 2020-05-24 MED ORDER — MIDAZOLAM HCL 2 MG/2ML IJ SOLN
INTRAMUSCULAR | Status: DC | PRN
  Administered 2020-05-24: 2 mg via INTRAVENOUS

## 2020-05-24 MED ORDER — PROPOFOL 10 MG/ML IV BOLUS
INTRAVENOUS | Status: DC | PRN
  Administered 2020-05-24: 150 mg via INTRAVENOUS

## 2020-05-24 MED ORDER — MIDAZOLAM HCL 2 MG/2ML IJ SOLN
INTRAMUSCULAR | Status: AC
  Filled 2020-05-24: qty 2

## 2020-05-24 MED ORDER — PHENYLEPHRINE 40 MCG/ML (10ML) SYRINGE FOR IV PUSH (FOR BLOOD PRESSURE SUPPORT)
PREFILLED_SYRINGE | INTRAVENOUS | Status: DC | PRN
  Administered 2020-05-24: 80 ug via INTRAVENOUS

## 2020-05-24 MED ORDER — INDOMETHACIN 50 MG RE SUPP
RECTAL | Status: DC | PRN
  Administered 2020-05-24: 100 mg via RECTAL

## 2020-05-24 MED ORDER — GLUCAGON HCL RDNA (DIAGNOSTIC) 1 MG IJ SOLR
INTRAMUSCULAR | Status: DC | PRN
  Administered 2020-05-24: .5 mg via INTRAVENOUS

## 2020-05-24 MED ORDER — LIDOCAINE HCL (CARDIAC) PF 100 MG/5ML IV SOSY
PREFILLED_SYRINGE | INTRAVENOUS | Status: DC | PRN
  Administered 2020-05-24: 100 mg via INTRAVENOUS

## 2020-05-24 MED ORDER — SODIUM CHLORIDE 0.9 % IV SOLN
2.0000 g | Freq: Three times a day (TID) | INTRAVENOUS | Status: DC
  Administered 2020-05-24 – 2020-05-25 (×3): 2 g via INTRAVENOUS
  Filled 2020-05-24 (×4): qty 2

## 2020-05-24 MED ORDER — INDOMETHACIN 50 MG RE SUPP
RECTAL | Status: AC
  Filled 2020-05-24: qty 2

## 2020-05-24 MED ORDER — CIPROFLOXACIN IN D5W 400 MG/200ML IV SOLN
INTRAVENOUS | Status: DC | PRN
  Administered 2020-05-24: 400 mg via INTRAVENOUS

## 2020-05-24 MED ORDER — ONDANSETRON HCL 4 MG/2ML IJ SOLN
INTRAMUSCULAR | Status: DC | PRN
  Administered 2020-05-24: 4 mg via INTRAVENOUS

## 2020-05-24 MED ORDER — DEXAMETHASONE SODIUM PHOSPHATE 10 MG/ML IJ SOLN
INTRAMUSCULAR | Status: DC | PRN
  Administered 2020-05-24: 5 mg via INTRAVENOUS

## 2020-05-24 MED ORDER — METRONIDAZOLE 500 MG/100ML IV SOLN
500.0000 mg | Freq: Three times a day (TID) | INTRAVENOUS | Status: DC
  Administered 2020-05-24 – 2020-05-25 (×3): 500 mg via INTRAVENOUS
  Filled 2020-05-24 (×3): qty 100

## 2020-05-24 MED ORDER — GLUCAGON HCL RDNA (DIAGNOSTIC) 1 MG IJ SOLR
INTRAMUSCULAR | Status: AC
  Filled 2020-05-24: qty 1

## 2020-05-24 MED ORDER — FENTANYL CITRATE (PF) 250 MCG/5ML IJ SOLN
INTRAMUSCULAR | Status: DC | PRN
  Administered 2020-05-24: 100 ug via INTRAVENOUS

## 2020-05-24 MED ORDER — SODIUM CHLORIDE 0.9 % IV SOLN
INTRAVENOUS | Status: DC | PRN
  Administered 2020-05-24: 10 mL

## 2020-05-24 MED ORDER — METHOCARBAMOL 500 MG PO TABS
500.0000 mg | ORAL_TABLET | Freq: Four times a day (QID) | ORAL | Status: DC | PRN
  Administered 2020-05-24: 500 mg via ORAL
  Filled 2020-05-24: qty 1

## 2020-05-24 MED ORDER — LACTATED RINGERS IV BOLUS: 1000.0000 mL | Freq: Three times a day (TID) | INTRAVENOUS | Status: DC | PRN

## 2020-05-24 MED ORDER — OXYCODONE HCL 5 MG PO TABS: 5.0000 mg | ORAL_TABLET | Freq: Four times a day (QID) | ORAL | Status: DC | PRN

## 2020-05-24 MED ORDER — CIPROFLOXACIN IN D5W 400 MG/200ML IV SOLN
INTRAVENOUS | Status: AC
  Filled 2020-05-24: qty 200

## 2020-05-24 MED ORDER — ROCURONIUM BROMIDE 10 MG/ML (PF) SYRINGE
PREFILLED_SYRINGE | INTRAVENOUS | Status: DC | PRN
  Administered 2020-05-24: 50 mg via INTRAVENOUS

## 2020-05-24 MED ORDER — FENTANYL CITRATE (PF) 250 MCG/5ML IJ SOLN
INTRAMUSCULAR | Status: AC
  Filled 2020-05-24: qty 5

## 2020-05-24 MED ORDER — SUGAMMADEX SODIUM 200 MG/2ML IV SOLN
INTRAVENOUS | Status: DC | PRN
  Administered 2020-05-24: 200 mg via INTRAVENOUS

## 2020-05-24 MED ORDER — PROPOFOL 10 MG/ML IV BOLUS
INTRAVENOUS | Status: AC
  Filled 2020-05-24: qty 20

## 2020-05-24 MED ORDER — PROPOFOL 500 MG/50ML IV EMUL
INTRAVENOUS | Status: AC
  Filled 2020-05-24: qty 50

## 2020-05-24 NOTE — Progress Notes (Signed)
Patient reported to this nurse that she had been instructed to flush her drain daily.

## 2020-05-24 NOTE — Progress Notes (Signed)
Jordan Chung [id number] 11/04/1990  CARE TEAM:  PCP: [facility]  Outpatient Care Team: Patient Care Team: Pa, [facility] as PCP - General (Family Medicine)  Inpatient Treatment Team: Treatment Team: Attending Provider: Rodolph Bong, MD; Rounding Team: Lilyan Gilford, MD; Consulting Physician: Montez Morita, Md, MD; Consulting Physician: Vida Rigger, MD; Registered Nurse: Malstead, Cassie A, RN; Utilization Review: Berna Bue, RN; Registered Nurse: Kathryne Eriksson, RN; Technician: Duncan-Williams, [place] L, NT; Pharmacist: Winfield Rast, [facility]   Problem List:   Principal Problem:   Bile leak Active Problems:   Abdominal pain   Acute pancreatitis   Anemia   SOB (shortness of breath)   Leukocytosis   S/P laparoscopic cholecystectomy   Day of Surgery   05/09/2020  Indications: This patient presents with symptomatic gallbladder disease and will undergo laparoscopic cholecystectomy.  Pre-operative Diagnosis: acute cholecystitis with cholelithiasis  Post-operative Diagnosis: Same  Surgeon: Abigail Miyamoto      Assessment  Stable with delayed bile leak  [facility] Stay = 2 days)  Assessment/Plan POD 14, s/p lap chole by DB on 5/2 with post op bile leak, s/p ERCP now with pancreatitis -HIDA shows persistent small leak.  Per GI likely plan for repeat ERCP with stent exchange and upsize today. -cont NPO for pancreatitis.  Consider trying clear liquid diet after ERCP and follow clinically. -cont JP drain until no further bilious output.    May need to stay up to 6 weeks for this delayed bile leak to finally resolve.  We will see.  Marland Kitchen  FEN - NPO/IVFs VTE - Lovenox ID - merrem, but no indication as WBC likely secondary to reactivity from pancreatitis.  Bile leak controlled. No infectious etiology present.  Consider stopping 23 hours after stent exchange.  Defer to primary service and gastroenterology   Disposition:  Disposition:  The  patient is from: Home  Anticipate discharge to:  Home with Home Health  Anticipated Date of Discharge is:  May 19,2022    Barriers to discharge:  Pending Clinical improvement (more likely than not)  Patient currently is NOT MEDICALLY STABLE for discharge from the hospital from a surgery standpoint.      20 minutes spent in review, evaluation, examination, counseling, and coordination of care.   I have reviewed this patient's available data, including medical history, events of note, physical examination and test results as part of my evaluation.  A significant portion of that time was spent in counseling.  Care during the described time interval was provided by me.  05/24/2020    Subjective: (Chief complaint)  Tired and sore but stable.  No nausea.  ERCP delayed yesterday.  Plan is for this morning.  Objective:  Vital signs:  Vitals:   05/23/20 0730 05/23/20 1427 05/23/20 2149 05/24/20 0608  BP: (!) 100/56 (!) 111/45 (!) 97/40 (!) 111/51  Pulse: 66 77 66 63  Resp:  Temp:  97.6 F (36.4 C) 98.1 F (36.7 C) (!) 97.5 F (36.4 C)  TempSrc:  Oral Oral Oral  SpO2:  95% 96% 96%  Weight:      Height:        Last BM Date: 05/21/20  Intake/Output   Yesterday:  05/16 0701 - 05/17 0700 In: 1539.6 [P.O.:120; I.V.:1309.6; IV Piggyback:100] Out: 1395 [Urine:1350; Drains:45] This shift:  No intake/output data recorded.  Bowel function:  Flatus: YES  BM:  No  Drain: Bilious   Physical Exam:  General: Pt awake/alert in no acute  distress.  Tired and mildly sickly but not toxic. Eyes: PERRL, normal EOM.  Sclera clear.  No icterus Neuro: CN II-XII intact w/o focal sensory/motor deficits. Lymph: No head/neck/groin lymphadenopathy Psych:  No delerium/psychosis/paranoia.  Oriented x 4 HENT: Normocephalic, Mucus membranes moist.  No thrush Neck: Supple, No tracheal deviation.  No obvious thyromegaly Chest: No pain to chest wall compression.  Good respiratory  excursion.  No audible wheezing CV:  Pulses intact.  Regular rhythm.  No major extremity edema MS: Normal AROM mjr joints.  No obvious deformity  Abdomen: Soft.  Mildy distended.  Tenderness at Right upper quadrant around surgical drain site .  No evidence of peritonitis.  No incarcerated hernias.  Ext:  No deformity.  No mjr edema.  No cyanosis Skin: No petechiae / purpurea.  No major sores.  Warm and dry    Results:   Cultures: Recent Results (from the past 720 hour(s))  Resp Panel by RT-PCR (Flu A&B, Covid) Nasopharyngeal Swab     Status: None   Collection Time: 05/09/20  4:28 AM   Specimen: Nasopharyngeal Swab; Nasopharyngeal(NP) swabs in vial transport medium  Result Value Ref Range Status   SARS Coronavirus 2 by RT PCR NEGATIVE NEGATIVE Final    Comment: (NOTE) SARS-CoV-2 target nucleic acids are NOT DETECTED.  The SARS-CoV-2 RNA is generally detectable in upper respiratory specimens during the acute phase of infection. The lowest concentration of SARS-CoV-2 viral copies this assay can detect is 138 copies/mL. A negative result does not preclude SARS-Cov-2 infection and should not be used as the sole basis for treatment or other patient management decisions. A negative result may occur with  improper specimen collection/handling, submission of specimen other than nasopharyngeal swab, presence of viral mutation(s) within the areas targeted by this assay, and inadequate number of viral copies(<138 copies/mL). A negative result must be combined with clinical observations, patient history, and epidemiological information. The expected result is Negative.  Fact Sheet for Patients:  BloggerCourse.com  Fact Sheet for Healthcare Providers:  SeriousBroker.it  This test is no t yet approved or cleared by the [place] FDA and  has been authorized for detection and/or diagnosis of SARS-CoV-2 by FDA under an Emergency Use  Authorization (EUA). This EUA will remain  in effect (meaning this test can be used) for the duration of the COVID-19 declaration under Section 564(b)(1) of the Act, 21 U.S.C.section 360bbb-3(b)(1), unless the authorization is terminated  or revoked sooner.       Influenza A by PCR NEGATIVE NEGATIVE Final   Influenza B by PCR NEGATIVE NEGATIVE Final    Comment: (NOTE) The Xpert Xpress SARS-CoV-2/FLU/RSV plus assay is intended as an aid in the diagnosis of influenza from Nasopharyngeal swab specimens and should not be used as a sole basis for treatment. Nasal washings and aspirates are unacceptable for Xpert Xpress SARS-CoV-2/FLU/RSV testing.  Fact Sheet for Patients: BloggerCourse.com  Fact Sheet for Healthcare Providers: SeriousBroker.it  This test is not yet approved or cleared by the [place] FDA and has been authorized for detection and/or diagnosis of SARS-CoV-2 by FDA under an Emergency Use Authorization (EUA). This EUA will remain in effect (meaning this test can be used) for the duration of the COVID-19 declaration under Section 564(b)(1) of the Act, 21 U.S.C. section 360bbb-3(b)(1), unless the authorization is terminated or revoked.  Performed at [facility], [address] [place], [place] [address]   Resp Panel by RT-PCR (Flu A&B, Covid) Nasopharyngeal Swab     Status:  None   Collection Time: 05/13/20  9:28 AM   Specimen: Nasopharyngeal Swab; Nasopharyngeal(NP) swabs in vial transport medium  Result Value Ref Range Status   SARS Coronavirus 2 by RT PCR NEGATIVE NEGATIVE Final    Comment: (NOTE) SARS-CoV-2 target nucleic acids are NOT DETECTED.  The SARS-CoV-2 RNA is generally detectable in upper respiratory specimens during the acute phase of infection. The lowest concentration of SARS-CoV-2 viral copies this assay can detect is 138 copies/mL. A negative result does not preclude  SARS-Cov-2 infection and should not be used as the sole basis for treatment or other patient management decisions. A negative result may occur with  improper specimen collection/handling, submission of specimen other than nasopharyngeal swab, presence of viral mutation(s) within the areas targeted by this assay, and inadequate number of viral copies(<138 copies/mL). A negative result must be combined with clinical observations, patient history, and epidemiological information. The expected result is Negative.  Fact Sheet for Patients:  BloggerCourse.com  Fact Sheet for Healthcare Providers:  SeriousBroker.it  This test is no t yet approved or cleared by the [place] FDA and  has been authorized for detection and/or diagnosis of SARS-CoV-2 by FDA under an Emergency Use Authorization (EUA). This EUA will remain  in effect (meaning this test can be used) for the duration of the COVID-19 declaration under Section 564(b)(1) of the Act, 21 U.S.C.section 360bbb-3(b)(1), unless the authorization is terminated  or revoked sooner.       Influenza A by PCR NEGATIVE NEGATIVE Final   Influenza B by PCR NEGATIVE NEGATIVE Final    Comment: (NOTE) The Xpert Xpress SARS-CoV-2/FLU/RSV plus assay is intended as an aid in the diagnosis of influenza from Nasopharyngeal swab specimens and should not be used as a sole basis for treatment. Nasal washings and aspirates are unacceptable for Xpert Xpress SARS-CoV-2/FLU/RSV testing.  Fact Sheet for Patients: BloggerCourse.com  Fact Sheet for Healthcare Providers: SeriousBroker.it  This test is not yet approved or cleared by the [place] FDA and has been authorized for detection and/or diagnosis of SARS-CoV-2 by FDA under an Emergency Use Authorization (EUA). This EUA will remain in effect (meaning this test can be used) for the duration of  the COVID-19 declaration under Section 564(b)(1) of the Act, 21 U.S.C. section 360bbb-3(b)(1), unless the authorization is terminated or revoked.  Performed at [facility], [address], [place], [place] [address]   Aerobic/Anaerobic Culture (surgical/deep wound)     Status: None   Collection Time: 05/14/20  2:28 PM   Specimen: Gallbladder; Abscess  Result Value Ref Range Status   Specimen Description   Final    GALL BLADDER Performed at [facility], [address], [place], [place] [address]    Special Requests   Final    ABSCESS Performed at [facility], [address], [place], [place] [address]    Gram Stain   Final    RARE WBC PRESENT, PREDOMINANTLY PMN NO ORGANISMS SEEN    Culture   Final    No growth aerobically or anaerobically. Performed at [facility], [address] [place], [place] [address]    Report Status 05/19/2020 FINAL  Final  Resp Panel by RT-PCR (Flu A&B, Covid) Nasopharyngeal Swab     Status: None   Collection Time: 05/21/20  9:59 PM   Specimen: Nasopharyngeal Swab; Nasopharyngeal(NP) swabs in vial transport medium  Result Value Ref Range Status   SARS Coronavirus 2 by RT PCR NEGATIVE NEGATIVE Final  Comment: (NOTE) SARS-CoV-2 target nucleic acids are NOT DETECTED.  The SARS-CoV-2 RNA is generally detectable in upper respiratory specimens during the acute phase of infection. The lowest concentration of SARS-CoV-2 viral copies this assay can detect is 138 copies/mL. A negative result does not preclude SARS-Cov-2 infection and should not be used as the sole basis for treatment or other patient management decisions. A negative result may occur with  improper specimen collection/handling, submission of specimen other than nasopharyngeal swab, presence of viral mutation(s) within the areas targeted by this assay, and inadequate number of viral copies(<138 copies/mL). A negative result  must be combined with clinical observations, patient history, and epidemiological information. The expected result is Negative.  Fact Sheet for Patients:  BloggerCourse.com  Fact Sheet for Healthcare Providers:  SeriousBroker.it  This test is no t yet approved or cleared by the [place] FDA and  has been authorized for detection and/or diagnosis of SARS-CoV-2 by FDA under an Emergency Use Authorization (EUA). This EUA will remain  in effect (meaning this test can be used) for the duration of the COVID-19 declaration under Section 564(b)(1) of the Act, 21 U.S.C.section 360bbb-3(b)(1), unless the authorization is terminated  or revoked sooner.       Influenza A by PCR NEGATIVE NEGATIVE Final   Influenza B by PCR NEGATIVE NEGATIVE Final    Comment: (NOTE) The Xpert Xpress SARS-CoV-2/FLU/RSV plus assay is intended as an aid in the diagnosis of influenza from Nasopharyngeal swab specimens and should not be used as a sole basis for treatment. Nasal washings and aspirates are unacceptable for Xpert Xpress SARS-CoV-2/FLU/RSV testing.  Fact Sheet for Patients: BloggerCourse.com  Fact Sheet for Healthcare Providers: SeriousBroker.it  This test is not yet approved or cleared by the [place] FDA and has been authorized for detection and/or diagnosis of SARS-CoV-2 by FDA under an Emergency Use Authorization (EUA). This EUA will remain in effect (meaning this test can be used) for the duration of the COVID-19 declaration under Section 564(b)(1) of the Act, 21 U.S.C. section 360bbb-3(b)(1), unless the authorization is terminated or revoked.  Performed at [facility], [address], [place], [place] [address]   Culture, blood (Routine X 2) w Reflex to ID Panel     Status: None (Preliminary result)   Collection Time: 05/22/20 12:11 PM   Specimen: BLOOD   Result Value Ref Range Status   Specimen Description   Final    BLOOD LEFT ANTECUBITAL Performed at [facility], [address] [place], [place] [address]    Special Requests   Final    BOTTLES DRAWN AEROBIC ONLY Blood Culture adequate volume Performed at [facility], [address], [place], [place] [address]    Culture   Final    NO GROWTH 2 DAYS Performed at [facility], [address], [place], [place] [address]    Report Status PENDING  Incomplete  Culture, blood (Routine X 2) w Reflex to ID Panel     Status: None (Preliminary result)   Collection Time: 05/22/20 12:11 PM   Specimen: BLOOD  Result Value Ref Range Status   Specimen Description   Final    BLOOD Performed by [facility] Performed at [facility], [address] [place], [place] [address]    Special Requests   Final    BOTTLES DRAWN AEROBIC ONLY Blood Culture adequate volume Performed at [facility], [address] [place], [place] [address]  Culture   Final    NO GROWTH 2 DAYS Performed at [facility], [address] [place], [place] [address]    Report Status PENDING  Incomplete    Labs: Results for orders placed or performed during the hospital encounter of 05/21/20 (from the past 48 hour(s))  Lactic acid, plasma     Status: None   Collection Time: 05/22/20  8:41 AM  Result Value Ref Range   Lactic Acid, Venous 1.0 0.5 - 1.9 mmol/L    Comment: Performed at [facility], [address], [place], [place] [address]  ABO/Rh     Status: None   Collection Time: 05/22/20  8:55 AM  Result Value Ref Range   ABO/RH(D)      O POS Performed at [facility], [address] [place], [place] [address]   Urinalysis, Routine w reflex microscopic Urine, Clean Catch     Status: Abnormal   Collection Time: 05/22/20 10:29 AM  Result Value Ref Range   Color, Urine STRAW  (A) YELLOW   APPearance CLEAR CLEAR   Specific Gravity, Urine 1.009 1.005 - 1.030   pH 6.0 5.0 - 8.0   Glucose, UA NEGATIVE NEGATIVE mg/dL   Hgb urine dipstick NEGATIVE NEGATIVE   Bilirubin Urine NEGATIVE NEGATIVE   Ketones, ur NEGATIVE NEGATIVE mg/dL   Protein, ur NEGATIVE NEGATIVE mg/dL   Nitrite NEGATIVE NEGATIVE   Leukocytes,Ua NEGATIVE NEGATIVE    Comment: Performed at [facility], [address] [place], [place] [address]  Culture, blood (Routine X 2) w Reflex to ID Panel     Status: None (Preliminary result)   Collection Time: 05/22/20 12:11 PM   Specimen: BLOOD  Result Value Ref Range   Specimen Description      BLOOD LEFT ANTECUBITAL Performed at [facility], [address], [place], [place] [address]    Special Requests      BOTTLES DRAWN AEROBIC ONLY Blood Culture adequate volume Performed at [facility], [address] [place], [place] [address]    Culture      NO GROWTH 2 DAYS Performed at [facility], [address], [place], [place] [address]    Report Status PENDING   Culture, blood (Routine X 2) w Reflex to ID Panel     Status: None (Preliminary result)   Collection Time: 05/22/20 12:11 PM   Specimen: BLOOD  Result Value Ref Range   Specimen Description      BLOOD Performed by [facility] Performed at [facility], [address] [place], [place] [address]    Special Requests      BOTTLES DRAWN AEROBIC ONLY Blood Culture adequate volume Performed at [facility], [address], [place], [place] [address]    Culture      NO GROWTH 2 DAYS Performed at [facility], [address] [place], [place] [address]    Report Status PENDING   Glucose, capillary     Status: Abnormal   Collection Time: 05/22/20 11:51 PM  Result Value Ref Range   Glucose-Capillary 105 (H) 70 - 99 mg/dL    Comment: Glucose reference range applies only to  samples taken after fasting for at least 8 hours.  Vitamin B12     Status: None   Collection Time: 05/23/20  5:39 AM  Result Value Ref Range   Vitamin B-12 318 180 - 914 pg/mL  Comment: (NOTE) This assay is not validated for testing neonatal or myeloproliferative syndrome specimens for Vitamin B12 levels. Performed at [facility], [address], [place], [place] [address]   Folate     Status: None   Collection Time: 05/23/20  5:39 AM  Result Value Ref Range   Folate 18.7 >5.9 ng/mL    Comment: Performed at [facility], [address], [place], [place] [address]  Iron and TIBC     Status: Abnormal   Collection Time: 05/23/20  5:39 AM  Result Value Ref Range   Iron 28 28 - 170 ug/dL   TIBC 383 338 - 329 ug/dL   Saturation Ratios 8 (L) 10.4 - 31.8 %   UIBC 323 ug/dL    Comment: Performed at [facility], [address] [place], [place] [address]  Ferritin     Status: None   Collection Time: 05/23/20  5:39 AM  Result Value Ref Range   Ferritin 49 11 - 307 ng/mL    Comment: Performed at [facility], [address], [place], [place] [address]  Comprehensive metabolic panel     Status: Abnormal   Collection Time: 05/23/20  5:39 AM  Result Value Ref Range   Sodium 140 135 - 145 mmol/L   Potassium 3.9 3.5 - 5.1 mmol/L   Chloride 104 98 - 111 mmol/L   CO2 30 22 - 32 mmol/L   Glucose, Bld 95 70 - 99 mg/dL    Comment: Glucose reference range applies only to samples taken after fasting for at least 8 hours.   BUN 5 (L) 6 - 20 mg/dL   Creatinine, Ser 5.99 0.44 - 1.00 mg/dL   Calcium 8.7 (L) 8.9 - 10.3 mg/dL   Total Protein 5.8 (L) 6.5 - 8.1 g/dL   Albumin 3.0 (L) 3.5 - 5.0 g/dL   AST 10 (L) 15 - 41 U/L   ALT 14 0 - 44 U/L   Alkaline Phosphatase 70 38 - 126 U/L   Total Bilirubin 1.1 0.3 - 1.2 mg/dL   GFR, Estimated >77 >41 mL/min    Comment: (NOTE) Calculated using the CKD-EPI Creatinine Equation  (2021)    Anion gap 6 5 - 15    Comment: Performed at [facility], [address], [place], [place] [address]  CBC with Differential/Platelet     Status: Abnormal   Collection Time: 05/23/20  5:39 AM  Result Value Ref Range   WBC 10.0 4.0 - 10.5 K/uL   RBC 3.91 3.87 - 5.11 MIL/uL   Hemoglobin 9.6 (L) 12.0 - 15.0 g/dL   HCT 32.0 (L) 23.3 - 43.5 %   MCV 81.1 80.0 - 100.0 fL   MCH 24.6 (L) 26.0 - 34.0 pg   MCHC 30.3 30.0 - 36.0 g/dL   RDW 68.6 (H) 16.8 - 37.2 %   Platelets 270 150 - 400 K/uL   nRBC 0.0 0.0 - 0.2 %   Neutrophils Relative % 60 %   Neutro Abs 6.1 1.7 - 7.7 K/uL   Lymphocytes Relative 31 %   Lymphs Abs 3.1 0.7 - 4.0 K/uL   Monocytes Relative 7 %   Monocytes Absolute 0.7 0.1 - 1.0 K/uL   Eosinophils Relative 2 %   Eosinophils Absolute 0.2 0.0 - 0.5 K/uL   Basophils Relative 0 %   Basophils Absolute 0.0 0.0 - 0.1 K/uL   Immature Granulocytes 0 %   Abs Immature Granulocytes 0.02 0.00 -  0.07 K/uL    Comment: Performed at [facility], [address] [place], [place] [address]  Magnesium     Status: None   Collection Time: 05/23/20  5:39 AM  Result Value Ref Range   Magnesium 2.0 1.7 - 2.4 mg/dL    Comment: Performed at [facility], [address] [place], [place] [address]  Prealbumin     Status: Abnormal   Collection Time: 05/23/20  5:39 AM  Result Value Ref Range   Prealbumin 17.5 (L) 18 - 38 mg/dL    Comment: Performed at [facility], [address] [place], [place] [address]  Phosphorus     Status: Abnormal   Collection Time: 05/23/20  5:39 AM  Result Value Ref Range   Phosphorus 4.7 (H) 2.5 - 4.6 mg/dL    Comment: Performed at [facility], [address] [place], [place] [address]  Comprehensive metabolic panel     Status: Abnormal   Collection Time: 05/24/20  5:17 AM  Result Value Ref Range   Sodium 140 135 - 145 mmol/L   Potassium 3.8 3.5 - 5.1 mmol/L    Chloride 104 98 - 111 mmol/L   CO2 29 22 - 32 mmol/L   Glucose, Bld 99 70 - 99 mg/dL    Comment: Glucose reference range applies only to samples taken after fasting for at least 8 hours.   BUN 5 (L) 6 - 20 mg/dL   Creatinine, Ser 6.570.53 0.44 - 1.00 mg/dL   Calcium 8.6 (L) 8.9 - 10.3 mg/dL   Total Protein 5.7 (L) 6.5 - 8.1 g/dL   Albumin 2.9 (L) 3.5 - 5.0 g/dL   AST 13 (L) 15 - 41 U/L   ALT 13 0 - 44 U/L   Alkaline Phosphatase 64 38 - 126 U/L   Total Bilirubin 0.6 0.3 - 1.2 mg/dL   GFR, Estimated >84>60 >69>60 mL/min    Comment: (NOTE) Calculated using the CKD-EPI Creatinine Equation (2021)    Anion gap 7 5 - 15    Comment: Performed at [facility], [address] [place], [place] [address]  CBC with Differential/Platelet     Status: Abnormal   Collection Time: 05/24/20  5:17 AM  Result Value Ref Range   WBC 6.8 4.0 - 10.5 K/uL   RBC 3.76 (L) 3.87 - 5.11 MIL/uL   Hemoglobin 9.3 (L) 12.0 - 15.0 g/dL   HCT 84.130.3 (L) 32.436.0 - 40.146.0 %   MCV 80.6 80.0 - 100.0 fL   MCH 24.7 (L) 26.0 - 34.0 pg   MCHC 30.7 30.0 - 36.0 g/dL   RDW 02.717.6 (H) 25.311.5 - 66.415.5 %   Platelets 264 150 - 400 K/uL   nRBC 0.0 0.0 - 0.2 %   Neutrophils Relative % 46 %   Neutro Abs 3.1 1.7 - 7.7 K/uL   Lymphocytes Relative 43 %   Lymphs Abs 2.9 0.7 - 4.0 K/uL   Monocytes Relative 8 %   Monocytes Absolute 0.6 0.1 - 1.0 K/uL   Eosinophils Relative 3 %   Eosinophils Absolute 0.2 0.0 - 0.5 K/uL   Basophils Relative 0 %   Basophils Absolute 0.0 0.0 - 0.1 K/uL   Immature Granulocytes 0 %   Abs Immature Granulocytes 0.02 0.00 - 0.07 K/uL    Comment: Performed at [facility], [address] [place], [place] [address]    Imaging / Studies: DG CHEST PORT 1 VIEW  Result Date:  05/22/2020 CLINICAL DATA:  Increased shortness of breath. EXAM: PORTABLE CHEST 1 VIEW COMPARISON:  05/13/2020 FINDINGS: There is stable elevation of the RIGHT hemidiaphragm. Heart size is accentuated by technique. There  are scattered areas of subsegmental atelectasis. No consolidations or pulmonary edema. IMPRESSION: Shallow inflation.  Subsegmental atelectasis. Electronically Signed   By: Norva Pavlov M.D.   On: 05/22/2020 14:25   NM HEPATOBILIARY LEAK (POST-SURGICAL)  Result Date: 05/22/2020 CLINICAL DATA:  Status post cholecystectomy with previously suspected biliary leak EXAM: NUCLEAR MEDICINE HEPATOBILIARY IMAGING TECHNIQUE: Sequential images of the abdomen were obtained out to 120 minutes following intravenous administration of radiopharmaceutical. Note that biliary drain clamped for this study. RADIOPHARMACEUTICALS:  5.4 mCi Tc-11m  Choletec IV COMPARISON:  May 13, 2020 nuclear medicine hepatobiliary imaging study; ERCP May 15, 2020. CT abdomen and pelvis May 21, 2020 FINDINGS: Liver uptake of radiotracer is normal. There is prompt visualization of small bowel indicating patency of the common bile duct. There is a small focus of radiotracer uptake in the region of the gallbladder fossa, similar to findings on the previous study. No other areas of abnormal radiotracer uptake noted. IMPRESSION: Persistent small focus of abnormal uptake in the gallbladder fossa region. A small persistent contained leak is suspected. Contrast in a gallbladder remnant could present in this manner. Note that no gallbladder remnant evident on recent ERCP examination, supporting small residual contained biliary leak in the gallbladder fossa as a more likely differential consideration. Study otherwise unremarkable. Note patency of the common bile duct. Electronically Signed   By: Bretta Bang III M.D.   On: 05/22/2020 17:55    Medications / Allergies: per chart  Antibiotics: Anti-infectives (From admission, onward)   Start     Dose/Rate Route Frequency Ordered Stop   05/22/20 0730  meropenem (MERREM) 1 g in sodium chloride 0.9 % 100 mL IVPB        1 g 200 mL/hr over 30 Minutes Intravenous Every 8 hours 05/22/20 7510           Note: Portions of this report may have been transcribed using voice recognition software. Every effort was made to ensure accuracy; however, inadvertent computerized transcription errors may be present.   Any transcriptional errors that result from this process are unintentional.    Ardeth Sportsman, MD, FACS, MASCRS  Esophageal, Gastrointestinal & Colorectal Surgery Robotic and Minimally Invasive Surgery [facility] [address] [place], [place] [address] [phone] Fax [phone] Main/Paging  CONTACT INFORMATION: Weekday (9AM-5PM) concerns: Call [facility] main office at [phone] Weeknight (5PM-9AM) or Weekend/Holiday concerns: Check [email or web address] for General Surgery [facility] coverage (Please, do not use SecureChat as it is not reliable communication to operating surgeons for immediate patient care)      05/24/2020  8:01 AM

## 2020-05-24 NOTE — Op Note (Signed)
[facility] Patient Name: Jordan Chung Procedure Date: 05/24/2020 MRN: [id number] Attending MD: Willis Modena , MD Date of Birth: 1990-12-14 CSN: [id number] Age: 30 Admit Type: Inpatient Procedure:                ERCP Indications:              Bile leak Providers:                Willis Modena, MD, Norman Clay, RN, Leanne Lovely, Technician, Crista Elliot Referring MD:             [facility] Medicines:                General Anesthesia, Cipro 400 mg IV, Indomethacin                            100 mg PR, Glucagon 0.5 mg IV Complications:            No immediate complications. Estimated Blood Loss:     Estimated blood loss: none. Procedure:                Pre-Anesthesia Assessment:                           - Prior to the procedure, a History and Physical                            was performed, and patient medications and                            allergies were reviewed. The patient's tolerance of                            previous anesthesia was also reviewed. The risks                            and benefits of the procedure and the sedation                            options and risks were discussed with the patient.                            All questions were answered, and informed consent                            was obtained. Prior Anticoagulants: The patient has                            taken no previous anticoagulant or antiplatelet                            agents. ASA Grade Assessment: II - A patient with  mild systemic disease. After reviewing the risks                            and benefits, the patient was deemed in                            satisfactory condition to undergo the procedure.                           After obtaining informed consent, the scope was                            passed under direct vision. Throughout the                            procedure, the patient's  blood pressure, pulse, and                            oxygen saturations were monitored continuously. The                            Olympus [id number]-[id number]) was introduced through                            the mouth, and used to inject contrast into and                            used to inject contrast into the bile duct. The                            ERCP was accomplished without difficulty. The                            patient tolerated the procedure well. Scope In: Scope Out: Findings:      A biliary stent was visible on the scout film. The major papilla was       normal. The common bile duct contained one temporary stent (7 Fr x 7       cm). This was found to be visibly patent. The stent was removed using a       snare. Cholangiogram obtained after stent removal; common bile duct not       dilated; cystic duct stump seen and no clear bile leak seen.       Intrahepatic tree seen and no clear leak seen. No stricture or filling       defects noted. Subsequently, a 10 Fr by 7 cm temporary stent with a       single external flap and a single internal flap was placed 6 cm into the       common bile duct. Bile flowed through the stent. The stent was in good       position. Pancreatogram was not obtained (intentionally). Impression:               - The major papilla appeared normal.                           -  One stent was exchanged in the common bile duct,                            upsized from 7 Fr to 10 Fr. Moderate Sedation:      None Recommendation:           - Avoid aspirin and nonsteroidal anti-inflammatory                            medicines for 3 days.                           - Watch for pancreatitis, bleeding, perforation,                            and cholangitis.                           - Clear liquid diet today.                           Deboraha Sprang- [facility] will follow. If ongoing JP bilious                            drainage, patient might have duct of Luschka leak                             not appreciated on her two recent ERCPs, for which                            ERCP + stent is sometimes not successful. Procedure Code(s):        --- Professional ---                           [phone], Endoscopic retrograde                            cholangiopancreatography (ERCP); with removal and                            exchange of stent(s), biliary or pancreatic duct,                            including pre- and post-dilation and guide wire                            passage, when performed, including sphincterotomy,                            when performed, each stent exchanged Diagnosis Code(s):        --- Professional ---                           K83.8, Other specified diseases of biliary tract CPT copyright 2019 American Medical Association. All rights reserved. The codes documented in this report are preliminary and  upon coder review may  be revised to meet current compliance requirements. Willis Modena, MD 05/24/2020 11:34:38 AM This report has been signed electronically. Number of Addenda: 0

## 2020-05-24 NOTE — Progress Notes (Signed)
Pharmacy Antibiotic Note  Jordan Chung is a 30 y.o. female admitted on 05/21/2020 with lap chole 5/2 complicated by postop bile leak with subsequent biloma drain placement and ERCP w/ sphincterotomy and stent. Patient presents with worsening pain possibly due to acute pancreatitis.  Pharmacy was initially consulted for mereopenem dosing, now consulted for cefepime/flagyl dosing.  Plan:  Cefepime 2g IV q8h  Flagyl 500 mg IV q8h  Follow up renal function, culture results, and clinical course.   Height: 5\' 8"  (172.7 cm) Weight: 102.1 kg (225 lb) IBW/kg (Calculated) : 63.9  Temp (24hrs), Avg:98.1 F (36.7 C), Min:97.5 F (36.4 C), Max:98.8 F (37.1 C)  Recent Labs  Lab 05/21/20 1958 05/21/20 2109 05/22/20 0628 05/22/20 0629 05/22/20 0841 05/23/20 0539 05/24/20 0517  WBC 19.1*  --   --  13.9*  --  10.0 6.8  CREATININE 0.83  --   --  0.63  --  0.65 0.53  LATICACIDVEN  --  1.2 0.6  --  1.0  --   --     Estimated Creatinine Clearance: 126.2 mL/min (by C-G formula based on SCr of 0.53 mg/dL).    No Known Allergies   Thank you for allowing pharmacy to be a part of this patient's care.  05/26/20 PharmD, BCPS Clinical Pharmacist [facility] main pharmacy [phone] 05/24/2020 12:26 PM

## 2020-05-24 NOTE — Anesthesia Procedure Notes (Signed)
Procedure Name: Intubation Date/Time: 05/24/2020 10:30 AM Performed by: Yolonda Kida, CRNA Pre-anesthesia Checklist: Patient identified, Emergency Drugs available, Suction available and Patient being monitored Patient Re-evaluated:Patient Re-evaluated prior to induction Oxygen Delivery Method: Circle system utilized Preoxygenation: Pre-oxygenation with 100% oxygen Induction Type: IV induction Ventilation: Mask ventilation without difficulty Laryngoscope Size: Miller and 2 Grade View: Grade I Tube type: Oral Tube size: 7.0 mm Number of attempts: 1 Airway Equipment and Method: Stylet Placement Confirmation: ETT inserted through vocal cords under direct vision,  positive ETCO2 and breath sounds checked- equal and bilateral Secured at: 21 cm Tube secured with: Tape Dental Injury: Teeth and Oropharynx as per pre-operative assessment

## 2020-05-24 NOTE — Anesthesia Postprocedure Evaluation (Signed)
Anesthesia Post Note  Patient: Jordan Chung  Procedure(s) Performed: ENDOSCOPIC RETROGRADE CHOLANGIOPANCREATOGRAPHY (ERCP) (N/A ) STENT REMOVAL BILIARY STENT PLACEMENT (N/A )     Patient location during evaluation: Endoscopy Anesthesia Type: General Level of consciousness: awake and alert Pain management: pain level controlled Vital Signs Assessment: post-procedure vital signs reviewed and stable Respiratory status: spontaneous breathing, nonlabored ventilation, respiratory function stable and patient connected to nasal cannula oxygen Cardiovascular status: blood pressure returned to baseline and stable Postop Assessment: no apparent nausea or vomiting Anesthetic complications: no   No complications documented.  Last Vitals:  Vitals:   05/24/20 1140 05/24/20 1150  BP: (!) 100/49 (!) 101/48  Pulse: 70 60  Resp: 20 16  Temp:    SpO2: 93% 93%    Last Pain:  Vitals:   05/24/20 1150  TempSrc:   PainSc: 0-No pain                 Trevor Iha

## 2020-05-24 NOTE — Interval H&P Note (Signed)
History and Physical Interval Note:  05/24/2020 9:28 AM  Tonna Boehringer  has presented today for surgery, with the diagnosis of bile leak.  The various methods of treatment have been discussed with the patient and family. After consideration of risks, benefits and other options for treatment, the patient has consented to  Procedure(s): ENDOSCOPIC RETROGRADE CHOLANGIOPANCREATOGRAPHY (ERCP) (N/A) as a surgical intervention.  The patient's history has been reviewed, patient examined, no change in status, stable for surgery.  I have reviewed the patient's chart and labs.  Questions were answered to the patient's satisfaction.     Neveyah Garzon M  Persistent leak. Mild post-ERCP pancreatitis (resolved).  Plan ERCP for stent upsizing.  Will give indomethacin (and will ask pharmacy how long she must wait prior to resuming breastfeeding).  Risks (up to and including bleeding, infection, perforation, pancreatitis that can be complicated by infected necrosis and death), benefits (removal of stones, alleviating blockage, decreasing risk of cholangitis or choledocholithiasis-related pancreatitis), and alternatives (watchful waiting, percutaneous transhepatic cholangiography) of ERCP were explained to patient/family in detail and patient elects to proceed.

## 2020-05-24 NOTE — Progress Notes (Signed)
PROGRESS NOTE    Jordan Chung  ZOX:[id number] DOB: 08/13/1990 DOA: 05/21/2020 PCP: Doris CheadlePa, [facility]    Chief Complaint  Patient presents with  . Abdominal Pain    Brief Narrative:  Patient is a pleasant 30 year old female, recent laparoscopic cholecystectomy 05/09/2020 complicated by postop bile leak subsequently underwent CT guided biloma drain placement.  On 05/15/2020 patient underwent second ERCP with sphincterotomy and stent placement per Dr. Marca AnconaKarki and subsequently discharged home 05/17/2020 presented to the ED with worsening epigastric and right upper quadrant abdominal pain.  CT abdomen and pelvis with some pancreatic inflammation, elevated total bilirubin of 1.3, lipase level of 47 down from 173 ( 05/16/2020).  Patient also noted to have a leukocytosis white count of 19.1.  General surgery consulted and recommended medicine admission with general surgical consultation. -Patient admitted placed on aggressive fluid resuscitation, pain management for post ERCP pancreatitis.   Assessment & Plan:   Principal Problem:   Bile leak Active Problems:   Abdominal pain   Acute pancreatitis   Anemia   SOB (shortness of breath)   Leukocytosis   S/P laparoscopic cholecystectomy  1 abdominal pain secondary to ongoing biliary leak/less likely post ERCP pancreatitis -Patient presented with epigastric and right upper quadrant abdominal pain after recent hospitalization for postop bile leak status post ERCP with sphincterotomy and stent placement 05/15/2020. -CT abdomen and pelvis done on presentation with concern for some pancreatic inflammation/acute pancreatitis.  Bilirubin noted to be at 1.3.  Lipase level of 47 down from 173(05/16/2020).  Patient also with a leukocytosis white count of 19,000 which is trending down and currently at 6800.  -Patient seen in consultation by general surgery who are recommended GI consultation. -CT abdomen and pelvis with resolution of fluid in gallbladder fossa,  drain still with consistent output. -Patient seen in consultation by gastroenterology who are concerned for ongoing biliary leak and as such HIDA scan ordered.   -HIDA scan showed small focus of abnormal uptake in the gallbladder fossa region concerning for persistent contained leak.  Patency of CBD. -Pain is much better controlled today.  -Dilaudid PCA pump has been discontinued -Continue current pain regimen of Dilaudid 0.5 to 2 mg IV every 2 hours as needed severe pain, Toradol 30 mg IV every 6 hours as needed moderate pain -Blood cultures pending.   -Narrow IV antibiotics from IV Merrem to IV cefepime and Flagyl pending ERCP today.  -Patient for ERCP today with stent exchange and upsize of stent today.  -Continue bowel rest -IV fluids.  -Per GI and general surgery.   2.  Shortness of breath -Patient with shortness of breath felt secondary to abdominal pain secondary to biliary leak.  -Chest x-ray unremarkable. -Denies any significant shortness of breath. -Continue current pain regimen. -Supportive care.  3.  Recent cholecystectomy complicated by bile leak/status post CT-guided bowel drain placement. -Per general surgery.  4.  Leukocytosis -Likely reactive secondary to #1. -Leukocytosis trending down.  -Chest x-ray negative for any acute infiltrates.  -Urinalysis nitrite negative, leukocytes negative.  -Blood cultures pending with no growth to date. -Narrow IV antibiotics from IV Merrem to IV cefepime and IV Flagyl.    5.  Anemia -Likely iron deficiency anemia in menstruating female.  Patient status post recent childbirth. -Anemia panel with iron level of 28, ferritin of 49, folate of 18.7, vitamin B12 of 318.  -Hemoglobin stable at 9.3.   -Transfusion threshold hemoglobin < 7.  -Follow H&H.   DVT prophylaxis: Lovenox Code Status: Full Family Communication: Updated  patient.  No family at bedside.  Disposition:   Status is: Inpatient    Dispo: The patient is from:  Home              Anticipated d/c is to: Home              Patient currently with ongoing bowel leakage, also concern for possible acute post ERCP pancreatitis with significant abdominal pain, on IV fluids, requiring IV pain medication, for ERCP today.  Not stable for discharge.    Difficult to place patient: No       Consultants:   General surgery: Dr. Magnus Ivan 05/22/2020  Gastroenterology: Dr.Magod 05/22/2020  Procedures:   CT abdomen and pelvis 05/21/2020  HIDA scan 05/22/2020  Chest x-ray 05/22/2020  ERCP pending  Antimicrobials:   IV Merrem 05/22/2020>>>> 05/24/2020  IV cefepime 05/24/2020  IV Flagyl 05/24/2020   Subjective: Patient in preop area awaiting to get ERCP done today.  No chest pain.  No shortness of breath.  Still with epigastric and right upper quadrant pain however pain better controlled.   Objective: Vitals:   05/24/20 0900 05/24/20 1115 05/24/20 1120 05/24/20 1133  BP: (!) 119/51 (!) 144/60 (!) 140/59 (!) 118/51  Pulse: 72  78 88  Resp: (!) 8  20 19   Temp: 98.8 F (37.1 C)     TempSrc: Oral     SpO2: 98%  94% 96%  Weight: 102.1 kg     Height: 5\' 8"  (1.727 m)       Intake/Output Summary (Last 24 hours) at 05/24/2020 1140 Last data filed at 05/24/2020 1115 Gross per 24 hour  Intake 2539.62 ml  Output 45 ml  Net 2494.62 ml   Filed Weights   05/21/20 1926 05/24/20 0900  Weight: 103.9 kg 102.1 kg    Examination:  General exam: NAD Respiratory system: Lungs clear to auscultation bilaterally.  No wheezes, no crackles, no rhonchi.  Normal respiratory effort.  Cardiovascular system: RRR no murmurs rubs or gallops.  No JVD.  No lower extremity edema.  Gastrointestinal system: Abdomen is soft, nondistended, some tenderness to palpation epigastrium and right upper quadrant.  Some pain around drain site.  Right JP drain with bilious drainage noted.  Positive bowel sounds.  No rebound.  No guarding.  Incision sites c/d/i. Central nervous system: Alert  and oriented.  Moving extremities spontaneously.  No focal neurological deficits. Extremities: Symmetric 5 x 5 power. Skin: No rashes, lesions or ulcers Psychiatry: Judgement and insight appear normal. Mood & affect appropriate.     Data Reviewed: I have personally reviewed following labs and imaging studies  CBC: Recent Labs  Lab 05/21/20 1958 05/22/20 0629 05/23/20 0539 05/24/20 0517  WBC 19.1* 13.9* 10.0 6.8  NEUTROABS 14.8* 10.1* 6.1 3.1  HGB 11.4* 9.7* 9.6* 9.3*  HCT 37.8 32.4* 31.7* 30.3*  MCV 80.4 81.0 81.1 80.6  PLT 350 256 270 264    Basic Metabolic Panel: Recent Labs  Lab 05/21/20 1958 05/22/20 0629 05/23/20 0539 05/24/20 0517  NA 139 136 140 140  K 4.9 3.6 3.9 3.8  CL 108 108 104 104  CO2 21* 25 30 29   GLUCOSE 78 110* 95 99  BUN 13 9 5* 5*  CREATININE 0.83 0.63 0.65 0.53  CALCIUM 8.8* 8.1* 8.7* 8.6*  MG  --  1.9 2.0  --   PHOS  --   --  4.7*  --     GFR: Estimated Creatinine Clearance: 126.2 mL/min (by C-G formula based on SCr  of 0.53 mg/dL).  Liver Function Tests: Recent Labs  Lab 05/21/20 1958 05/22/20 0629 05/23/20 0539 05/24/20 0517  AST 30 12* 10* 13*  ALT 19 16 14 13   ALKPHOS 80 74 70 64  BILITOT 1.3* 0.7 1.1 0.6  PROT 6.8 5.9* 5.8* 5.7*  ALBUMIN 3.7 3.2* 3.0* 2.9*    CBG: Recent Labs  Lab 05/22/20 2351  GLUCAP 105*     Recent Results (from the past 240 hour(s))  Aerobic/Anaerobic Culture (surgical/deep wound)     Status: None   Collection Time: 05/14/20  2:28 PM   Specimen: Gallbladder; Abscess  Result Value Ref Range Status   Specimen Description   Final    GALL BLADDER Performed at [facility], [address] [place], [place]    Special Requests   Final    ABSCESS Performed at [facility], [address], [place], [place]    Gram Stain   Final    RARE WBC PRESENT, PREDOMINANTLY PMN NO ORGANISMS SEEN    Culture   Final    No growth aerobically or  anaerobically. Performed at [facility], [address], [place], [place]    Report Status 05/19/2020 FINAL  Final  Resp Panel by RT-PCR (Flu A&B, Covid) Nasopharyngeal Swab     Status: None   Collection Time: 05/21/20  9:59 PM   Specimen: Nasopharyngeal Swab; Nasopharyngeal(NP) swabs in vial transport medium  Result Value Ref Range Status   SARS Coronavirus 2 by RT PCR NEGATIVE NEGATIVE Final    Comment: (NOTE) SARS-CoV-2 target nucleic acids are NOT DETECTED.  The SARS-CoV-2 RNA is generally detectable in upper respiratory specimens during the acute phase of infection. The lowest concentration of SARS-CoV-2 viral copies this assay can detect is 138 copies/mL. A negative result does not preclude SARS-Cov-2 infection and should not be used as the sole basis for treatment or other patient management decisions. A negative result may occur with  improper specimen collection/handling, submission of specimen other than nasopharyngeal swab, presence of viral mutation(s) within the areas targeted by this assay, and inadequate number of viral copies(<138 copies/mL). A negative result must be combined with clinical observations, patient history, and epidemiological information. The expected result is Negative.  Fact Sheet for Patients:  05/23/20  Fact Sheet for Healthcare Providers:  BloggerCourse.com  This test is no t yet approved or cleared by the [employer] FDA and  has been authorized for detection and/or diagnosis of SARS-CoV-2 by FDA under an Emergency Use Authorization (EUA). This EUA will remain  in effect (meaning this test can be used) for the duration of the COVID-19 declaration under Section 564(b)(1) of the Act, 21 U.S.C.section 360bbb-3(b)(1), unless the authorization is terminated  or revoked sooner.       Influenza A by PCR NEGATIVE NEGATIVE Final   Influenza B by PCR NEGATIVE NEGATIVE Final     Comment: (NOTE) The Xpert Xpress SARS-CoV-2/FLU/RSV plus assay is intended as an aid in the diagnosis of influenza from Nasopharyngeal swab specimens and should not be used as a sole basis for treatment. Nasal washings and aspirates are unacceptable for Xpert Xpress SARS-CoV-2/FLU/RSV testing.  Fact Sheet for Patients: [place]  Fact Sheet for Healthcare Providers: BloggerCourse.com  This test is not yet approved or cleared by the [employer] FDA and has been authorized for detection and/or diagnosis of SARS-CoV-2 by FDA under an Emergency Use Authorization (EUA). This EUA will remain in effect (meaning this test  can be used) for the duration of the COVID-19 declaration under Section 564(b)(1) of the Act, 21 U.S.C. section 360bbb-3(b)(1), unless the authorization is terminated or revoked.  Performed at [facility], [address] [place], [place] [address]   Culture, blood (Routine X 2) w Reflex to ID Panel     Status: None (Preliminary result)   Collection Time: 05/22/20 12:11 PM   Specimen: BLOOD  Result Value Ref Range Status   Specimen Description   Final    BLOOD LEFT ANTECUBITAL Performed at [facility], [address] [place], [place] [address]    Special Requests   Final    BOTTLES DRAWN AEROBIC ONLY Blood Culture adequate volume Performed at [facility], [address] [place], [place] [address]    Culture   Final    NO GROWTH 2 DAYS Performed at [facility], [address], [place], [place] [address]    Report Status PENDING  Incomplete  Culture, blood (Routine X 2) w Reflex to ID Panel     Status: None (Preliminary result)   Collection Time: 05/22/20 12:11 PM   Specimen: BLOOD  Result Value Ref Range Status   Specimen Description   Final    BLOOD Performed by [facility] Performed at [facility], [address] [place], [place] [address]    Special Requests   Final    BOTTLES DRAWN AEROBIC ONLY Blood Culture adequate volume Performed at [facility], [address] [place], [place] [address]    Culture   Final    NO GROWTH 2 DAYS Performed at [facility], [address] [place], [place] [address]    Report Status PENDING  Incomplete         Radiology Studies: DG ERCP  Result Date: 05/24/2020 CLINICAL DATA:  ERCP with biliary stent exchange for bile leak. EXAM: ERCP TECHNIQUE: Multiple spot images obtained with the fluoroscopic device and submitted for interpretation post-procedure. FLUOROSCOPY TIME:  45 seconds COMPARISON:  ERCP-05/15/2020 Nuclear medicine HIDA scan-05/22/2020; 05/13/2020 CT abdomen and pelvis-05/21/2020; 05/13/2020 CT guided percutaneous drainage catheter placement into gallbladder fossa-05/14/2020 FINDINGS: Six spot fluoroscopic images of the right upper abdominal quadrant during ERCP are provided for review. Initial image demonstrates an ERCP probe overlying the right upper abdominal quadrant. Pre-existing internal biliary stent overlies expected location of the CBD. A percutaneous drainage catheter overlies expected location of the gallbladder fossa. Cholecystectomy clips. Subsequent images demonstrate interval removal of pre-existing biliary stent with selective cannulation and opacification of the CBD. There is opacification of the cystic duct without definitive evidence of contrast extravasation or passage of contrast to the level of the percutaneous drainage catheter however examination degraded secondary to technique. Completion images demonstrate placement of a new biliary stent overlying expected location of the CBD traversing the expected confluence with the cystic duct. IMPRESSION: ERCP with internal biliary stent exchange is above. These images were submitted for radiologic interpretation only. Please see the procedural report for  the amount of contrast and the fluoroscopy time utilized. Electronically Signed   By: Simonne Come M.D.   On: 05/24/2020 11:20   NM HEPATOBILIARY LEAK (POST-SURGICAL)  Result Date: 05/22/2020 CLINICAL DATA:  Status post cholecystectomy with previously suspected biliary leak EXAM: NUCLEAR MEDICINE HEPATOBILIARY IMAGING TECHNIQUE: Sequential images of the abdomen were obtained out to 120 minutes following intravenous administration of radiopharmaceutical. Note that biliary drain clamped for this study. RADIOPHARMACEUTICALS:  5.4 mCi Tc-58m  Choletec IV COMPARISON:  May 13, 2020 nuclear medicine hepatobiliary imaging study; ERCP May 15, 2020. CT abdomen and pelvis May 21, 2020 FINDINGS: Liver uptake of radiotracer is normal. There is prompt visualization of small bowel indicating patency of the common bile duct. There is a small focus of radiotracer uptake in the region of the gallbladder fossa, similar to findings on the previous study. No other areas of abnormal radiotracer uptake noted. IMPRESSION: Persistent small focus of abnormal uptake in the gallbladder fossa region. A small persistent contained leak is suspected. Contrast in a gallbladder remnant could present in this manner. Note that no gallbladder remnant evident on recent ERCP examination, supporting small residual contained biliary leak in the gallbladder fossa as a more likely differential consideration. Study otherwise unremarkable. Note patency of the common bile duct. Electronically Signed   By: Bretta Bang III M.D.   On: 05/22/2020 17:55        Scheduled Meds: . [MAR Hold] enoxaparin (LOVENOX) injection  40 mg Subcutaneous Q24H  . [MAR Hold] lip balm  1 application Topical BID  . [MAR Hold] polycarbophil  625 mg Oral BID  . [MAR Hold] senna-docusate  1 tablet Oral BID   Continuous Infusions: . [MAR Hold] lactated ringers    . lactated ringers 50 mL/hr at 05/24/20 1023  . [MAR Hold] meropenem (MERREM) IV 1 g (05/24/20 0526)  .  [MAR Hold] methocarbamol (ROBAXIN) IV    . [MAR Hold] ondansetron (ZOFRAN) IV       LOS: 2 days    Time spent: 35 minutes    Ramiro Harvest, MD [facility]   To contact the attending provider between 7A-7P or the covering provider during after hours 7P-7A, please log into the web site [email or web address] and access using universal [place] password for that web site. If you do not have the password, please call the hospital operator.  05/24/2020, 11:40 AM

## 2020-05-24 NOTE — Transfer of Care (Signed)
Immediate Anesthesia Transfer of Care Note  Patient: Jordan Chung  Procedure(s) Performed: ENDOSCOPIC RETROGRADE CHOLANGIOPANCREATOGRAPHY (ERCP) (N/A ) STENT REMOVAL BILIARY STENT PLACEMENT (N/A )  Patient Location: Endoscopy Unit  Anesthesia Type:General  Level of Consciousness: awake, alert , oriented, drowsy and patient cooperative  Airway & Oxygen Therapy: Patient Spontanous Breathing and Patient connected to face mask oxygen  Post-op Assessment: Report given to RN and Post -op Vital signs reviewed and stable  Post vital signs: Reviewed and stable  Last Vitals:  Vitals Value Taken Time  BP 144/60 05/24/20 1115  Temp    Pulse 80 05/24/20 1118  Resp 19 05/24/20 1118  SpO2 99 % 05/24/20 1118  Vitals shown include unvalidated device data.  Last Pain:  Vitals:   05/24/20 0900  TempSrc: Oral  PainSc: 0-No pain         Complications: No complications documented.

## 2020-05-25 DIAGNOSIS — K839 Disease of biliary tract, unspecified: Secondary | ICD-10-CM | POA: Diagnosis not present

## 2020-05-25 LAB — CBC WITH DIFFERENTIAL/PLATELET
Abs Immature Granulocytes: 0.04 10*3/uL (ref 0.00–0.07)
Basophils Absolute: 0 10*3/uL (ref 0.0–0.1)
Basophils Relative: 0 %
Eosinophils Absolute: 0.1 10*3/uL (ref 0.0–0.5)
Eosinophils Relative: 1 %
HCT: 31.5 % — ABNORMAL LOW (ref 36.0–46.0)
Hemoglobin: 9.5 g/dL — ABNORMAL LOW (ref 12.0–15.0)
Immature Granulocytes: 1 %
Lymphocytes Relative: 32 %
Lymphs Abs: 2.7 10*3/uL (ref 0.7–4.0)
MCH: 24 pg — ABNORMAL LOW (ref 26.0–34.0)
MCHC: 30.2 g/dL (ref 30.0–36.0)
MCV: 79.5 fL — ABNORMAL LOW (ref 80.0–100.0)
Monocytes Absolute: 0.5 10*3/uL (ref 0.1–1.0)
Monocytes Relative: 6 %
Neutro Abs: 5.2 10*3/uL (ref 1.7–7.7)
Neutrophils Relative %: 60 %
Platelets: 319 10*3/uL (ref 150–400)
RBC: 3.96 MIL/uL (ref 3.87–5.11)
RDW: 17.5 % — ABNORMAL HIGH (ref 11.5–15.5)
WBC: 8.5 10*3/uL (ref 4.0–10.5)
nRBC: 0 % (ref 0.0–0.2)

## 2020-05-25 LAB — COMPREHENSIVE METABOLIC PANEL
ALT: 15 U/L (ref 0–44)
AST: 15 U/L (ref 15–41)
Albumin: 3.3 g/dL — ABNORMAL LOW (ref 3.5–5.0)
Alkaline Phosphatase: 61 U/L (ref 38–126)
Anion gap: 7 (ref 5–15)
BUN: 6 mg/dL (ref 6–20)
CO2: 27 mmol/L (ref 22–32)
Calcium: 8.8 mg/dL — ABNORMAL LOW (ref 8.9–10.3)
Chloride: 109 mmol/L (ref 98–111)
Creatinine, Ser: 0.64 mg/dL (ref 0.44–1.00)
GFR, Estimated: >60 mL/min (ref >60)
Glucose, Bld: 104 mg/dL — ABNORMAL HIGH (ref 70–99)
Potassium: 3.5 mmol/L (ref 3.5–5.1)
Sodium: 143 mmol/L (ref 135–145)
Total Bilirubin: 0.6 mg/dL (ref 0.3–1.2)
Total Protein: 6.1 g/dL — ABNORMAL LOW (ref 6.5–8.1)

## 2020-05-25 LAB — GLUCOSE, CAPILLARY
Glucose-Capillary: 106 mg/dL — ABNORMAL HIGH (ref 70–99)
Glucose-Capillary: 97 mg/dL (ref 70–99)

## 2020-05-25 LAB — MAGNESIUM: Magnesium: 2 mg/dL (ref 1.7–2.4)

## 2020-05-25 MED ORDER — IBUPROFEN 200 MG PO TABS: 200.0000 mg | ORAL_TABLET | Freq: Four times a day (QID) | ORAL | 0 refills | Status: DC | PRN

## 2020-05-25 MED ORDER — SODIUM CHLORIDE 0.9 % IV SOLN
INTRAVENOUS | Status: DC | PRN
  Administered 2020-05-24: 10 mL

## 2020-05-25 MED ORDER — OXYCODONE HCL 5 MG PO TABS: 5.0000 mg | ORAL_TABLET | Freq: Four times a day (QID) | ORAL | 0 refills | Status: AC | PRN

## 2020-05-25 NOTE — Progress Notes (Addendum)
Pt discharged home with all belongings. Discharge education completed with pt, no questions or concerns at this time. Rx called into pt's pharmacy per MD order. Pt has follow up appointments scheduled with PCP, GI as well as IR.

## 2020-05-25 NOTE — Discharge Summary (Signed)
Physician Discharge Summary  Jordan Chung WUJ:[id number] DOB: 09-Oct-1990 DOA: 05/21/2020  PCP: [facility]  Admit date: 05/21/2020 Discharge date: 05/25/2020  Time spent: 40 minutes  Recommendations for Outpatient Follow-up:  1. Follow outpatient CBC/CMP 2. Follow outpatient with general surgery and IR as scheduled 3. Follow outpatient with gastroenterology in June for ERCP with stent removal and repeat cholangiogram in ~6 weeks 4. Follow atherosclerosis outpatient (noted to have age advanced atherosclerosis of aorta on imaging)  Discharge Diagnoses:  Principal Problem:   Bile leak Active Problems:   Abdominal pain   Acute pancreatitis   Anemia   SOB (shortness of breath)   Leukocytosis   S/P laparoscopic cholecystectomy   Discharge Condition: stable  Diet recommendation: heart healthy  Filed Weights   05/21/20 1926 05/24/20 0900  Weight: 103.9 kg 102.1 kg    History of present illness:  Patient is Jordan Chung pleasant 30 year old female, recent laparoscopic cholecystectomy 05/09/2020 complicated by postop bile leak subsequently underwent CT guided biloma drain placement.  On 05/15/2020 patient underwent second ERCP with sphincterotomy and stent placement per Dr. [name] and subsequently discharged home 05/17/2020.  She presented to the ED with worsening epigastric and right upper quadrant abdominal pain.  CT abdomen and pelvis with some pancreatic inflammation, elevated total bilirubin of 1.3, lipase level of 47 down from 173 ( 05/16/2020).  Patient also noted to have Terrilynn Postell leukocytosis white count of 19.1.  General surgery consulted and recommended medicine admission with general surgical consultation.  She was initially started on broad antibiotics.  GI recommened HIDA scan which was suggestive of persistent contained leak.  She's now s/p ERCP on 5/17 with stent exchange.  Stable for discharge at this time with plan for f/u outpatient with GI, IR, and surgery.  Hospital Course:  1  abdominal pain secondary to ongoing biliary leak/less likely post ERCP pancreatitis -Patient presented with epigastric and right upper quadrant abdominal pain after recent hospitalization for postop bile leak status post ERCP with sphincterotomy and stent placement 05/15/2020 as well as CT guided biloma drain placement on 5/7 by IR. -CT abdomen and pelvis done on presentation with percutaneous drainage catheter in gallbladder fossa with resolved fluid collection.  Biliary stent in place decompressing the biliary tree.  No intra or extrahepatic biliary ductal dilatation.  Faint peripancreatic stranding about the pancreatic head.  (see report)   -HIDA scan showed small focus of abnormal uptake in the gallbladder fossa region concerning for persistent contained leak.  Patency of CBD. -s/p ERCP 5/17 with stent exchange -improved pain today on day of discharge -Per surgery, recommending stop flushing drain, stable for d/c home from surgical standpoint - follow with Dr. Magnus Ivan and IR next week - ok to stop abx -Per GI, needs repeat ERCP with biliary stent removal and repeat cholangiogram in ~6 weeks - abx d/c'd at discharge  2.  Shortness of breath - resolved - CXR with subsegmental atelectasis  3.  Recent cholecystectomy complicated by bile leak/status post CT-guided bowel drain placement. -Per general surgery -> recommending stop flushing drain tohelp with healing of fistulous tarct, stable for d/c home per surg standpoint - f/u with Dr. Magnus Ivan and IR 5/24  4.  Leukocytosis -resolved   5.  Anemia -Likely iron deficiency anemia in menstruating female.  Patient status post recent childbirth. -Anemia panel with iron level of 28, ferritin of 49, folate of 18.7, vitamin B12 of 318.  -Hemoglobin stable at discharge -Transfusion threshold hemoglobin < 7.   Procedures: ERCP The major papilla  appeared normal. - One stent was exchanged in the common bile duct, upsized from 7 Fr to 10 Fr. Avoid  aspirin and nonsteroidal anti-inflammatory medicines for 3 days. - Watch for pancreatitis, bleeding, perforation, and cholangitis. - Clear liquid diet today. [facility] will follow. If ongoing JP bilious drainage, patient might have duct of Luschka leak not appreciated on her two recent ERCPs, for which ERCP + stent is sometimes not successful.  Consultations:  GI  Surgery  Discharge Exam: Vitals:   05/24/20 2020 05/25/20 0544  BP: 119/65 111/67  Pulse: 74 63  Resp: 18 16  Temp: 98.1 F (36.7 C) 98.3 F (36.8 C)  SpO2: 99% 100%   Comfortable, pain is better No new complaints  General: No acute distress. Cardiovascular: Heart sounds show Orla Jolliff regular rate, and rhythm.  Lungs: Clear to auscultation bilaterally  Abdomen: Soft, nontender, nondistended - drain with greenish/brown drainage (she notes lessening) Neurological: Alert and oriented 3. Moves all extremities 4 . Cranial nerves II through XII grossly intact. Skin: Warm and dry. No rashes or lesions. Extremities: No clubbing or cyanosis. No edema.   Discharge Instructions   Discharge Instructions    Call MD for:  difficulty breathing, headache or visual disturbances   Complete by: As directed    Call MD for:  extreme fatigue   Complete by: As directed    Call MD for:  hives   Complete by: As directed    Call MD for:  persistant dizziness or light-headedness   Complete by: As directed    Call MD for:  persistant nausea and vomiting   Complete by: As directed    Call MD for:  redness, tenderness, or signs of infection (pain, swelling, redness, odor or green/yellow discharge around incision site)   Complete by: As directed    Call MD for:  severe uncontrolled pain   Complete by: As directed    Call MD for:  temperature >100.4   Complete by: As directed    Diet - low sodium heart healthy   Complete by: As directed    Discharge instructions   Complete by: As directed    You were seen for Caera Enwright bile leak.  You had an  ERCP with stent exchange on 5/17 (avoid aspirin and NSAIDs for 3 days).  Your symptoms have improved at this time.  Please follow up with Dr. Magnus Ivan and interventional radiology as scheduled next week on 5/24.  Please follow up with gastroenterology on 07/06/20 for EGD with stent removal.  Return if new, recurrent, or worsening symptoms.  Please ask your PCP to request records from this hospitalization so they know what was done and what the next steps will be.   Discharge wound care:   Complete by: As directed    Stop flushing drain   Increase activity slowly   Complete by: As directed      Allergies as of 05/25/2020   No Known Allergies     Medication List    STOP taking these medications   sodium chloride flush 0.9 % Soln Commonly known as: NS   traMADol 50 MG tablet Commonly known as: Ultram     TAKE these medications   acetaminophen 325 MG tablet Commonly known as: TYLENOL Take 2 tablets (650 mg total) by mouth every 6 (six) hours as needed for mild pain (or temp > 100).   docusate sodium 100 MG capsule Commonly known as: Colace Take 1 capsule (100 mg total) by mouth 2 (two)  times daily. What changed:   when to take this  reasons to take this   ibuprofen 200 MG tablet Commonly known as: ADVIL Take 1 tablet (200 mg total) by mouth every 6 (six) hours as needed for moderate pain. Start taking on: May 27, 2020 What changed: These instructions start on May 27, 2020. If you are unsure what to do until then, ask your doctor or other care provider.   methocarbamol 500 MG tablet Commonly known as: ROBAXIN Take 1 tablet (500 mg total) by mouth every 6 (six) hours as needed for muscle spasms.   multivitamin-prenatal 27-0.8 MG Tabs tablet Take 1 tablet by mouth daily.   ondansetron 4 MG tablet Commonly known as: ZOFRAN Take 1 tablet (4 mg total) by mouth every 6 (six) hours.   oxyCODONE 5 MG immediate release tablet Commonly known as: Oxy IR/ROXICODONE Take 1  tablet (5 mg total) by mouth every 6 (six) hours as needed for up to 5 days for moderate pain or severe pain. What changed: how much to take            Discharge Care Instructions  (From admission, onward)         Start     Ordered   05/25/20 0000  Discharge wound care:       Comments: Stop flushing drain   05/25/20 1209         No Known Allergies  Follow-up Information    Abigail Miyamoto, MD Follow up on 05/31/2020.   Specialty: General Surgery Contact information: [address] [place] [address] [phone]        Gastroenterology, [facility] Follow up.   Why: Follow up on 07/06/2020 Contact information: [address] [place] [address] [phone]                The results of significant diagnostics from this hospitalization (including imaging, microbiology, ancillary and laboratory) are listed below for reference.    Significant Diagnostic Studies: CT Abdomen Pelvis Wo Contrast  Result Date: 05/21/2020 CLINICAL DATA:  Abdominal pain and fever. Postop. Recent cholecystectomy complicated by bile leak with biliary stenting and percutaneous drainage. EXAM: CT ABDOMEN AND PELVIS WITHOUT CONTRAST TECHNIQUE: Multidetector CT imaging of the abdomen and pelvis was performed following the standard protocol without IV contrast. COMPARISON:  Most recent CT 05/13/2020 FINDINGS: Lower chest: Mild lower lobe atelectasis.  No pleural fluid. Hepatobiliary: Biliary stent in place decompressing the biliary tree. No intra or extrahepatic biliary ductal dilatation. Drainage catheter in the gallbladder fossa with resolution of previous fluid collection. No inflammation along the course of the catheter. No new hepatic abnormality. Pancreas: Faint peripancreatic stranding about the pancreatic head, series 2, image 34. No ductal dilatation. No acute pancreatic collection. Spleen: Normal in size without focal abnormality. Adrenals/Urinary Tract: Normal adrenal  glands. Bilateral intrarenal calculi without hydronephrosis or perinephric edema. No ureteral stones. No perinephric edema. Urinary bladder is minimally distended. Stomach/Bowel: Patulous distal esophagus. Unremarkable stomach. Normal positioning of the duodenum and ligament of Treitz. Normal small bowel without obstruction or inflammation. Normal appendix. Moderate colonic stool burden without colonic wall thickening or inflammation. Vascular/Lymphatic: Normal caliber abdominal aorta. Mild aortic atherosclerosis, advanced for age. No enlarged lymph nodes in the abdomen or pelvis. Reproductive: Uterus and bilateral adnexa are unremarkable. Other: Fluid collection in the gallbladder fossa has resolved after percutaneous drainage. No new intra-abdominal collection. No free fluid. No free air. Postsurgical change of the anterior abdominal wall without subcutaneous collection.  Musculoskeletal: There are no acute or suspicious osseous abnormalities. IMPRESSION: 1. Percutaneous drainage catheter in the gallbladder fossa with resolved fluid collection. 2. Biliary stent in place decompressing the biliary tree. No intra or extrahepatic biliary ductal dilatation. 3. Faint peripancreatic stranding about the pancreatic head, can be seen with acute pancreatitis. Recommend correlation with pancreatic enzymes. 4. Bilateral nonobstructing nephrolithiasis. 5. Mild aortic atherosclerosis is age advanced. Aortic Atherosclerosis (ICD10-I70.0). Electronically Signed   By: Narda Rutherford M.D.   On: 05/21/2020 23:46   DG Abdomen 1 View  Result Date: 05/21/2020 CLINICAL DATA:  Abdominal pain. Cholecystectomy 12 days ago, recent biliary stent placement. EXAM: ABDOMEN - 1 VIEW COMPARISON:  CT 05/13/2020 FINDINGS: Portable supine views of the abdomen obtained. Right upper quadrant biliary stent in place. Right upper quadrant drainage catheter in place. Surgical clips from cholecystectomy. No bowel dilatation or evidence of obstruction.  Small to moderate colonic stool burden. Intrarenal calculi on recent CT are partially obscured by overlying stool. C shaped 2 cm small bore catheter projects over the left sacrum, unclear if this is external to the patient. Low lung volumes without significant pleural effusion or focal airspace disease. IMPRESSION: 1. Right upper quadrant biliary stent in place. Right upper quadrant drainage catheter in place. 2. Small bore C-shaped density projecting over the left pelvis is of unknown etiology, may be external to the patient. 3. Normal bowel gas pattern. Electronically Signed   By: Narda Rutherford M.D.   On: 05/21/2020 20:28   CT ABDOMEN PELVIS W CONTRAST  Result Date: 05/13/2020 CLINICAL DATA:  Recent cholecystectomy with pain EXAM: CT ABDOMEN AND PELVIS WITH CONTRAST TECHNIQUE: Multidetector CT imaging of the abdomen and pelvis was performed using the standard protocol following bolus administration of intravenous contrast. CONTRAST:  OMNIPAQUE IOHEXOL 300 MG/ML  SOLN COMPARISON:  May 11, 2020 FINDINGS: Lower chest: There is bibasilar atelectasis. Lung bases otherwise are clear. Hepatobiliary: No focal liver lesions are appreciable. Gallbladder is absent. There is slightly more fluid in the gallbladder fossa region compared to 2 days prior with fluid collection in the gallbladder fossa region measuring 5.9 x 2.5 cm. This fluid collection is better defined than on study from 2 days prior. There is fat stranding in the medial gallbladder fossa region consistent with recent surgery, similar to 2 days prior. Trace air along the periphery of this fluid collection may be of postoperative etiology. Samarrah Tranchina well-defined air collection within the fluid in the gallbladder fossa is not appreciable. Soft tissue stranding is noted along the lateral right abdomen consistent with recent surgery, essentially stable. There is no appreciable biliary duct dilatation. Pancreas: No pancreatic mass or inflammatory focus. Spleen: No  splenic lesions are evident. Adrenals/Urinary Tract: Adrenals bilaterally appear normal. There is no evident mass or hydronephrosis on either side. There is Sanjna Haskew 2 mm calculus with an adjacent 3 mm calculus in the mid right kidney, unchanged. There is Kyrstal Monterrosa 2 mm calculus in the mid left kidney, unchanged. No ureteral calculus evident on either side. Urinary bladder is midline with wall thickness within normal limits. Stomach/Bowel: There is no bowel wall or mesenteric thickening. No evident bowel obstruction. Terminal ileum appears normal. Appendix appears normal. There is no evident free air or portal venous air. Small amount of pneumoperitoneum seen 2 days prior no longer evident. Vascular/Lymphatic: No abdominal aortic aneurysm. No arterial vascular lesions are evident. Major venous structures appear patent. There is Jenevie Casstevens circumaortic left renal vein, an anatomic variant. No evident adenopathy in the abdomen or pelvis. Reproductive:  Uterus is anteverted. No adnexal masses evident. There is Eldred Sooy small amount of fluid in the cul-de-sac, stable. Other: No well-defined abscess in the abdomen or pelvis. Minimal fluid tracking along the right lateral conal fascia is likely secondary to recent surgery. No ascites beyond the mild fluid in the cul-de-sac. Musculoskeletal: No blastic or lytic bone lesions. No intramuscular lesions. Evidence of recent surgery at the umbilicus level. IMPRESSION: 1. There has been an increase in Allyson Tineo well-defined fluid collection in the gallbladder fossa compared to 2 days prior with fluid in this area currently measuring 5.9 x 2.5 cm. No appreciable air within this collection noted. Early developing infectious lesion in this area must be of concern given the increase in fluid in this area. This fluid collection is somewhat better defined than 2 days prior. Close clinical and imaging surveillance in this regard advised. 2. Postoperative soft tissue stranding in the right abdomen with slight fluid tracking  into the right lateral conal fascia. No evidence suggesting abscess in these areas. 3. No bowel wall thickening or bowel obstruction. Appendix appears normal. 4. Nonobstructing calculi in each kidney. No hydronephrosis or ureteral calculus. Urinary bladder wall thickness normal. Electronically Signed   By: Bretta BangWilliam  Woodruff III M.D.   On: 05/13/2020 07:55   CT ABDOMEN PELVIS W CONTRAST  Result Date: 05/11/2020 CLINICAL DATA:  Laparoscopic cholecystectomy 05/09/2020, continued abdominal pain EXAM: CT ABDOMEN AND PELVIS WITH CONTRAST TECHNIQUE: Multidetector CT imaging of the abdomen and pelvis was performed using the standard protocol following bolus administration of intravenous contrast. CONTRAST:  100mL OMNIPAQUE IOHEXOL 300 MG/ML  SOLN COMPARISON:  05/09/2020, 03/27/2019 FINDINGS: Lower chest: No acute pleural or parenchymal lung disease. Hepatobiliary: Postsurgical changes from cholecystectomy, with minimal fluid and fat stranding in the gallbladder fossa compatible with postsurgical change. No evidence of fluid collection or abscess. The liver is unremarkable without biliary dilation. Pancreas: Unremarkable. No pancreatic ductal dilatation or surrounding inflammatory changes. Spleen: Normal in size without focal abnormality. Adrenals/Urinary Tract: There are bilateral nonobstructing renal calculi. There are two 3 mm nonobstructing calculi on the right, with Cashmere Harmes single 3 mm calculus on the left. No obstructive uropathy within either kidney. The adrenals and bladder are unremarkable. Stomach/Bowel: No bowel obstruction or ileus. Normal appendix central upper pelvis. No bowel wall thickening or inflammatory change. Vascular/Lymphatic: Aortic atherosclerosis. No enlarged abdominal or pelvic lymph nodes. Reproductive: Uterus and bilateral adnexa are unremarkable. Other: There is Abeera Flannery small amount of pneumoperitoneum within the upper abdomen consistent with recent laparoscopic surgery. Trace free fluid in the right  upper quadrant and lower pelvis consistent with postsurgical change. Postsurgical change at the umbilicus from laparoscopy port. No fluid collection. No abdominal wall hernia. Musculoskeletal: No acute or destructive bony lesions. Reconstructed images demonstrate no additional findings. IMPRESSION: 1. Expected postsurgical changes from laparoscopic cholecystectomy. No evidence of fluid collection or abscess. 2. Bilateral nonobstructing renal calculi. 3.  Aortic Atherosclerosis (ICD10-I70.0). Electronically Signed   By: Sharlet SalinaMichael  Brown M.D.   On: 05/11/2020 19:01   US Abdomen Limited  Result Date: 05/13/2020 CLINICAL DATA:  Pain post cholecystectomy EXAM: ULTRASOUND ABDOMEN LIMITED RIGHT UPPER QUADRANT COMPARISON:  05/11/2020, ultrasound 05/09/2020 FINDINGS: Gallbladder: Post cholecystectomy. Question some fluid towards the gallbladder fossa the exam was terminated prematurely due to patient discomfort. Common bile duct: Nonvisualized Liver: Incomplete assessment of the liver with only partial visualization of the left lobe. Other: Exam was terminated prematurely by the patient due to inability to tolerate scanning procedure with exquisite pain and tenderness upon transducer pressure. IMPRESSION:  Premature termination of the exam due to patient discomfort and inability to tolerate scanning procedure. Post cholecystectomy. Question some fluid towards the gallbladder fossa. Minimal visualization of the left lobe liver. Nonvisualization of the common bile duct. Electronically Signed   By: Kreg Shropshire M.D.   On: 05/13/2020 06:55   DG CHEST PORT 1 VIEW  Result Date: 05/22/2020 CLINICAL DATA:  Increased shortness of breath. EXAM: PORTABLE CHEST 1 VIEW COMPARISON:  05/13/2020 FINDINGS: There is stable elevation of the RIGHT hemidiaphragm. Heart size is accentuated by technique. There are scattered areas of subsegmental atelectasis. No consolidations or pulmonary edema. IMPRESSION: Shallow inflation.  Subsegmental  atelectasis. Electronically Signed   By: Norva Pavlov M.D.   On: 05/22/2020 14:25   DG CHEST PORT 1 VIEW  Result Date: 05/13/2020 CLINICAL DATA:  One month postpartum with recent laparoscopic cholecystectomy. Right upper quadrant abdominal pain. EXAM: PORTABLE CHEST 1 VIEW COMPARISON:  None. FINDINGS: The heart size and mediastinal contours are within normal limits. Low lung volumes. No focal airspace disease. The visualized skeletal structures are unremarkable. IMPRESSION: No evidence of acute cardiopulmonary disease. Electronically Signed   By: Caprice Renshaw   On: 05/13/2020 09:40   DG ERCP  Result Date: 05/24/2020 CLINICAL DATA:  ERCP with biliary stent exchange for bile leak. EXAM: ERCP TECHNIQUE: Multiple spot images obtained with the fluoroscopic device and submitted for interpretation post-procedure. FLUOROSCOPY TIME:  45 seconds COMPARISON:  ERCP-05/15/2020 Nuclear medicine HIDA scan-05/22/2020; 05/13/2020 CT abdomen and pelvis-05/21/2020; 05/13/2020 CT guided percutaneous drainage catheter placement into gallbladder fossa-05/14/2020 FINDINGS: Six spot fluoroscopic images of the right upper abdominal quadrant during ERCP are provided for review. Initial image demonstrates an ERCP probe overlying the right upper abdominal quadrant. Pre-existing internal biliary stent overlies expected location of the CBD. Latash Nouri percutaneous drainage catheter overlies expected location of the gallbladder fossa. Cholecystectomy clips. Subsequent images demonstrate interval removal of pre-existing biliary stent with selective cannulation and opacification of the CBD. There is opacification of the cystic duct without definitive evidence of contrast extravasation or passage of contrast to the level of the percutaneous drainage catheter however examination degraded secondary to technique. Completion images demonstrate placement of Jkwon Treptow new biliary stent overlying expected location of the CBD traversing the expected confluence  with the cystic duct. IMPRESSION: ERCP with internal biliary stent exchange is above. These images were submitted for radiologic interpretation only. Please see the procedural report for the amount of contrast and the fluoroscopy time utilized. Electronically Signed   By: Simonne Come M.D.   On: 05/24/2020 11:20   DG ERCP BILIARY & PANCREATIC DUCTS  Result Date: 05/15/2020 CLINICAL DATA:  30 year old female with bile leak following cholecystectomy. EXAM: ERCP TECHNIQUE: Multiple spot images obtained with the fluoroscopic device and submitted for interpretation post-procedure. FLUOROSCOPY TIME:  Fluoroscopy Time:  2 minutes 30 seconds Radiation Exposure Index (if provided by the fluoroscopic device): 58.3 mGy Number of Acquired Spot Images: 0 COMPARISON:  None. FINDINGS: Lincy Belles total of 7 intraoperative spot images are submitted for review. The images demonstrate Seanpaul Preece flexible endoscope in the descending duodenum. Initial wire cannulation of the pancreatic duct with placement of Jourdon Zimmerle plastic pancreatic stent. Subsequently, the common bile duct is cannulated. Contrast injection performed. No frank extravasation on the provided images. Catheleen Langhorne plastic biliary stent is then placed. Percutaneous drainage catheter noted in the right upper quadrant consistent with recent gallbladder fossa drain placement. IMPRESSION: ERCP with placement of pancreatic and biliary plastic stents. These images were submitted for radiologic interpretation only. Please see the  procedural report for the amount of contrast and the fluoroscopy time utilized. Electronically Signed   By: Malachy Moan M.D.   On: 05/15/2020 10:23   CT IMAGE GUIDED DRAINAGE BY PERCUTANEOUS CATHETER  Result Date: 05/14/2020 INDICATION: 30 year old female with history of biloma formation the gallbladder fossa status post laparoscopic cholecystectomy. EXAM: CT IMAGE GUIDED DRAINAGE BY PERCUTANEOUS CATHETER COMPARISON:  None. MEDICATIONS: The patient is currently admitted to the  hospital and receiving intravenous antibiotics. The antibiotics were administered within an appropriate time frame prior to the initiation of the procedure. ANESTHESIA/SEDATION: Moderate (conscious) sedation was employed during this procedure. Blu Mcglaun total of Versed 4 mg and Fentanyl 100 mcg was administered intravenously. Moderate Sedation Time: 15 minutes. The patient's level of consciousness and vital signs were monitored continuously by radiology nursing throughout the procedure under my direct supervision. CONTRAST:  None COMPLICATIONS: None immediate. PROCEDURE: Informed written consent was obtained from the patient after Aolani Piggott discussion of the risks, benefits and alternatives to treatment. The patient was placed supine on the CT gantry and Sheehan Stacey pre procedural CT was performed re-demonstrating the known abscess/fluid collection within the gallbladder fossa. The procedure was planned. Kathryn Linarez timeout was performed prior to the initiation of the procedure. The right upper quadrant was prepped and draped in the usual sterile fashion. The overlying soft tissues were anesthetized with 1% lidocaine with epinephrine. Appropriate trajectory was planned with the use of Tonimarie Gritz 22 gauge spinal needle. Oni Dietzman 21 gauge Chiba needle was advanced into the abscess/fluid collection followed by placement of Katrina Daddona 0.018 inch wire. Appropriate position was confirmed with Makya Yurko limited CT scan. Over the wire, Benito Lemmerman 6 [place] transition dilator was placed followed by Anahla Bevis short Amplatz super stiff wire was coiled within the collection. Appropriate positioning was confirmed with Sahalie Beth limited CT scan. The tract was serially dilated allowing placement of Malacai Grantz 10.2 [place] all-purpose drainage catheter. Appropriate positioning was confirmed with Taylar Hartsough limited postprocedural CT scan. Approximately 20 ml of thin maroon/brown fluid was aspirated. The tube was connected to Dalton Mille bulb suction and sutured in place. Dakin Madani dressing was placed. The patient tolerated the procedure well without immediate  post procedural complication. IMPRESSION: Successful CT guided placement of Engelbert Sevin 10.2 [place] all purpose drain catheter into the gallbladder fossa fluid collection with aspiration of approximately 20 mL of dark brown, maroon fluid. Samples were sent to the laboratory as requested by the ordering clinical team. Marliss Coots, MD [facility] Radiology Electronically Signed   By: Marliss Coots MD   On: 05/14/2020 15:02   NM HEPATOBILIARY LEAK (POST-SURGICAL)  Result Date: 05/22/2020 CLINICAL DATA:  Status post cholecystectomy with previously suspected biliary leak EXAM: NUCLEAR MEDICINE HEPATOBILIARY IMAGING TECHNIQUE: Sequential images of the abdomen were obtained out to 120 minutes following intravenous administration of radiopharmaceutical. Note that biliary drain clamped for this study. RADIOPHARMACEUTICALS:  5.4 mCi Tc-79m  Choletec IV COMPARISON:  May 13, 2020 nuclear medicine hepatobiliary imaging study; ERCP May 15, 2020. CT abdomen and pelvis May 21, 2020 FINDINGS: Liver uptake of radiotracer is normal. There is prompt visualization of small bowel indicating patency of the common bile duct. There is Starsky Nanna small focus of radiotracer uptake in the region of the gallbladder fossa, similar to findings on the previous study. No other areas of abnormal radiotracer uptake noted. IMPRESSION: Persistent small focus of abnormal uptake in the gallbladder fossa region. Alina Gilkey small persistent contained leak is suspected. Contrast in Jadalyn Oliveri gallbladder remnant could present in this manner. Note that no gallbladder remnant evident  on recent ERCP examination, supporting small residual contained biliary leak in the gallbladder fossa as Anaston Koehn more likely differential consideration. Study otherwise unremarkable. Note patency of the common bile duct. Electronically Signed   By: Bretta Bang III M.D.   On: 05/22/2020 17:55   NM HEPATOBILIARY LEAK (POST-SURGICAL)  Result Date:  05/13/2020 CLINICAL DATA:  Status post cholecystectomy with right upper quadrant pain. EXAM: NUCLEAR MEDICINE HEPATOBILIARY IMAGING TECHNIQUE: Sequential images of the abdomen were obtained out to 60 minutes following intravenous administration of radiopharmaceutical. RADIOPHARMACEUTICALS:  4.9 mCi Tc-108m  Choletec IV COMPARISON:  CT 05/13/20 FINDINGS: Prompt uptake and biliary excretion of activity by the liver is seen. Biliary activity passes into small bowel, consistent with patent common bile duct. At the end of the first hour Simon Llamas small focus mild increased radiotracer accumulation is noted within the gallbladder fossa. During the second hour there is persistent and mildly progressive radiotracer accumulation within the gallbladder fossa. IMPRESSION: 1. Small focus of mild progressive radiotracer accumulation within the gallbladder fossa. Primary differential consideration includes: small contained leak versus radiotracer accumulation within gallbladder remnant. 2. Common bile duct appears patent with most of the radiotracer accumulating within the small bowel loops. 3. These results were shared via EPIC CHAT at the time of interpretation on 05/13/2020 at 12:53 pm to provider [facility] , who acknowledged these results. Electronically Signed   By: Signa Kell M.D.   On: 05/13/2020 12:53   US Abdomen Limited RUQ (LIVER/GB)  Result Date: 05/09/2020 CLINICAL DATA:  Acute right upper quadrant pain EXAM: ULTRASOUND ABDOMEN LIMITED RIGHT UPPER QUADRANT COMPARISON:  CT 03/27/2019, ultrasound 03/25/2019 FINDINGS: Gallbladder: Echogenic, shadowing gallstone seen towards the gallbladder neck measuring to 1.8 cm in diameter. Gallbladder wall thickness is within normal limits. No pericholecystic fluid or inflammation. Sonographic Eulah Pont sign is reportedly negative. Common bile duct: Diameter: 7.3 mm, borderline dilated. No visible intraductal gallstones though portions of the distal common bile duct are poorly  visualized. Liver: No focal lesion identified. Within normal limits in parenchymal echogenicity. Portal vein is patent on color Doppler imaging with normal direction of blood flow towards the liver. Other: None. IMPRESSION: 1. Cholelithiasis.  No evidence of acute cholecystitis. 2. Borderline biliary ductal dilatation. Recommend correlation with serologies and if abnormal, MRCP could be obtained. This recommendation follows ACR consensus guidelines: White Paper of the ACR Incidental Findings Committee II on Gallbladder and Biliary Findings. J Am Coll Radiol 2013;10:953-956. Electronically Signed   By: Kreg Shropshire M.D.   On: 05/09/2020 06:53    Microbiology: Recent Results (from the past 240 hour(s))  Resp Panel by RT-PCR (Flu Joniel Graumann&B, Covid) Nasopharyngeal Swab     Status: None   Collection Time: 05/21/20  9:59 PM   Specimen: Nasopharyngeal Swab; Nasopharyngeal(NP) swabs in vial transport medium  Result Value Ref Range Status   SARS Coronavirus 2 by RT PCR NEGATIVE NEGATIVE Final    Comment: (NOTE) SARS-CoV-2 target nucleic acids are NOT DETECTED.  The SARS-CoV-2 RNA is generally detectable in upper respiratory specimens during the acute phase of infection. The lowest concentration of SARS-CoV-2 viral copies this assay can detect is 138 copies/mL. Jiovany Scheffel negative result does not preclude SARS-Cov-2 infection and should not be used as the sole basis for treatment or other patient management decisions. Jayna Mulnix negative result may occur with  improper specimen collection/handling, submission of specimen other than nasopharyngeal swab, presence of viral mutation(s) within the areas targeted by this assay, and inadequate number of viral copies(<138 copies/mL). [place] negative result must be  combined with clinical observations, patient history, and epidemiological information. The expected result is Negative.  Fact Sheet for Patients:  BloggerCourse.com  Fact Sheet for Healthcare  Providers:  SeriousBroker.it  This test is no t yet approved or cleared by the [place] FDA and  has been authorized for detection and/or diagnosis of SARS-CoV-2 by FDA under an Emergency Use Authorization (EUA). This EUA will remain  in effect (meaning this test can be used) for the duration of the COVID-19 declaration under Section 564(b)(1) of the Act, 21 U.S.C.section 360bbb-3(b)(1), unless the authorization is terminated  or revoked sooner.       Influenza Gianne Shugars by PCR NEGATIVE NEGATIVE Final   Influenza B by PCR NEGATIVE NEGATIVE Final    Comment: (NOTE) The Xpert Xpress SARS-CoV-2/FLU/RSV plus assay is intended as an aid in the diagnosis of influenza from Nasopharyngeal swab specimens and should not be used as Delando Satter sole basis for treatment. Nasal washings and aspirates are unacceptable for Xpert Xpress SARS-CoV-2/FLU/RSV testing.  Fact Sheet for Patients: BloggerCourse.com  Fact Sheet for Healthcare Providers: SeriousBroker.it  This test is not yet approved or cleared by the [place] FDA and has been authorized for detection and/or diagnosis of SARS-CoV-2 by FDA under an Emergency Use Authorization (EUA). This EUA will remain in effect (meaning this test can be used) for the duration of the COVID-19 declaration under Section 564(b)(1) of the Act, 21 U.S.C. section 360bbb-3(b)(1), unless the authorization is terminated or revoked.  Performed at [facility], [address], [place], [place] [address]   Culture, blood (Routine X 2) w Reflex to ID Panel     Status: None (Preliminary result)   Collection Time: 05/22/20 12:11 PM   Specimen: BLOOD  Result Value Ref Range Status   Specimen Description   Final    BLOOD LEFT ANTECUBITAL Performed at [facility], [address] [place], [place] [address]    Special Requests   Final    BOTTLES DRAWN AEROBIC  ONLY Blood Culture adequate volume Performed at [facility], [address], [place], [place] [address]    Culture   Final    NO GROWTH 3 DAYS Performed at [facility], [address], [place], [place] [address]    Report Status PENDING  Incomplete  Culture, blood (Routine X 2) w Reflex to ID Panel     Status: None (Preliminary result)   Collection Time: 05/22/20 12:11 PM   Specimen: BLOOD  Result Value Ref Range Status   Specimen Description   Final    BLOOD Performed by [facility] Performed at [facility], [address] [place], [place] [address]    Special Requests   Final    BOTTLES DRAWN AEROBIC ONLY Blood Culture adequate volume Performed at [facility], [address], [place], [place] [address]    Culture   Final    NO GROWTH 3 DAYS Performed at [facility], [address] [place], [place] [address]    Report Status PENDING  Incomplete     Labs: Basic Metabolic Panel: Recent Labs  Lab 05/21/20 1958 05/22/20 0629 05/23/20 0539 05/24/20 0517 05/25/20 0502  NA 139 136 140 140 143  K 4.9 3.6 3.9 3.8 3.5  CL 108 108 104 104 109  CO2 21* 25 30 29 27   GLUCOSE 78 110* 95 99 104*  BUN 13 9 5* 5* 6  CREATININE 0.83 0.63 0.65 0.53 0.64  CALCIUM 8.8* 8.1* 8.7* 8.6* 8.8*  MG  --  1.9 2.0  --  2.0  PHOS  --   --  4.7*  --   --    Liver Function Tests: Recent Labs  Lab 05/21/20 1958 05/22/20 0629 05/23/20 0539 05/24/20 0517 05/25/20 0502  AST 30 12* 10* 13* 15  ALT 19 16 14 13 15   ALKPHOS 80 74 70 64 61  BILITOT 1.3* 0.7 1.1 0.6 0.6  PROT 6.8 5.9* 5.8* 5.7* 6.1*  ALBUMIN 3.7 3.2* 3.0* 2.9* 3.3*   Recent Labs  Lab 05/21/20 1958 05/22/20 0629  LIPASE 47 21   No results for input(s): AMMONIA in the last 168 hours. CBC: Recent Labs  Lab 05/21/20 1958 05/22/20 0629 05/23/20 0539 05/24/20 0517 05/25/20 0502  WBC 19.1* 13.9* 10.0 6.8 8.5  NEUTROABS 14.8* 10.1* 6.1 3.1  5.2  HGB 11.4* 9.7* 9.6* 9.3* 9.5*  HCT 37.8 32.4* 31.7* 30.3* 31.5*  MCV 80.4 81.0 81.1 80.6 79.5*  PLT 350 256 270 264 319   Cardiac Enzymes: No results for input(s): CKTOTAL, CKMB, CKMBINDEX, TROPONINI in the last 168 hours. BNP: BNP (last 3 results) No results for input(s): BNP in the last 8760 hours.  ProBNP (last 3 results) No results for input(s): PROBNP in the last 8760 hours.  CBG: Recent Labs  Lab 05/22/20 2351 05/25/20 0002 05/25/20 0744  GLUCAP 105* 106* 97       Signed:  Lacretia Nicks MD.  [facility] 05/25/2020, 5:31 PM

## 2020-05-25 NOTE — Progress Notes (Signed)
[facility] Gastroenterology Progress Note  Jordan Chung 30 y.o. 02-05-1990  CC:  Bile leak   Subjective: Patient reports feeling much better.  States her pain is currently a 2/10.  Denies nausea/vomiting.  Has had decreased output from drain.  Had a loose stool last night.  ROS : Review of Systems  Cardiovascular: Negative for chest pain and palpitations.  Gastrointestinal: Positive for abdominal pain and diarrhea. Negative for blood in stool, constipation, heartburn, melena, nausea and vomiting.   Objective: Vital signs in last 24 hours: Vitals:   05/24/20 2020 05/25/20 0544  BP: 119/65 111/67  Pulse: 74 63  Resp: 18 16  Temp: 98.1 F (36.7 C) 98.3 F (36.8 C)  SpO2: 99% 100%    Physical Exam:  General:  Alert, cooperative, no distress  Head:  Normocephalic, without obvious abnormality, atraumatic  Eyes:  Anicteric sclera, EOMs intact  Lungs:   Clear to auscultation bilaterally, respirations unlabored  Heart:  Regular rate and rhythm, S1, S2 normal  Abdomen:   Soft, mild epigastric and RUQ tenderness, bowel sounds active all four quadrants  Extremities: Extremities normal, atraumatic, no  edema    Lab Results: Recent Labs    05/23/20 0539 05/24/20 0517 05/25/20 0502  NA 140 140 143  K 3.9 3.8 3.5  CL 104 104 109  CO2 30 29 27   GLUCOSE 95 99 104*  BUN 5* 5* 6  CREATININE 0.65 0.53 0.64  CALCIUM 8.7* 8.6* 8.8*  MG 2.0  --  2.0  PHOS 4.7*  --   --    Recent Labs    05/24/20 0517 05/25/20 0502  AST 13* 15  ALT 13 15  ALKPHOS 64 61  BILITOT 0.6 0.6  PROT 5.7* 6.1*  ALBUMIN 2.9* 3.3*   Recent Labs    05/24/20 0517 05/25/20 0502  WBC 6.8 8.5  NEUTROABS 3.1 5.2  HGB 9.3* 9.5*  HCT 30.3* 31.5*  MCV 80.6 79.5*  PLT 264 319   No results for input(s): LABPROT, INR in the last 72 hours.    Assessment/Plan: Bile leak s/p ERCP with stent replacement 5/17 -Normal LFTs: T. Bili 0.6/ AST 15/ ALT 15/ ALP 61 -Leukocytosis, resolved (WBCs 8.5)  Plan: OK to  discharge from a GI standpoint.  Patient has EGD with stent removal scheduled 07/06/20.  Patient advised to call our office or seek care sooner if worsening pain, increased output, or any other concerning symptoms. She verbalized understanding.  [facility] will sign off. Please contact 07/08/20 if we can be of any further assistance during this hospital stay.  [place] PA-C 05/25/2020, 9:20 AM  Contact #  [phone]

## 2020-05-25 NOTE — Progress Notes (Signed)
1 Day Post-Op  Subjective: No further pain.  Tolerating CLD with no nausea, vomiting, or increasing pain.  Drain output less since ERCP yesterday.  ROS: See above, otherwise other systems negative  Objective: Vital signs in last 24 hours: Temp:  [97.7 F (36.5 C)-98.5 F (36.9 C)] 98.3 F (36.8 C) (05/18 0544) Pulse Rate:  [60-88] 63 (05/18 0544) Resp:  [16-20] 16 (05/18 0544) BP: (100-144)/(48-67) 111/67 (05/18 0544) SpO2:  [93 %-100 %] 100 % (05/18 0544) Last BM Date: 05/24/20  Intake/Output from previous day: 05/17 0701 - 05/18 0700 In: 2767 [P.O.:960; I.V.:934.6; IV Piggyback:872.5] Out: 25 [Drains:25] Intake/Output this shift: No intake/output data recorded.  PE: Heart: regular Lungs: CTAB Abd: soft, ND, really not tender anymore, drain with maybe about 10cc of bilious output and not been emptied since last night.   Lab Results:  Recent Labs    05/24/20 0517 05/25/20 0502  WBC 6.8 8.5  HGB 9.3* 9.5*  HCT 30.3* 31.5*  PLT 264 319   BMET Recent Labs    05/24/20 0517 05/25/20 0502  NA 140 143  K 3.8 3.5  CL 104 109  CO2 29 27  GLUCOSE 99 104*  BUN 5* 6  CREATININE 0.53 0.64  CALCIUM 8.6* 8.8*   PT/INR No results for input(s): LABPROT, INR in the last 72 hours. CMP     Component Value Date/Time   NA 143 05/25/2020 0502   K 3.5 05/25/2020 0502   CL 109 05/25/2020 0502   CO2 27 05/25/2020 0502   GLUCOSE 104 (H) 05/25/2020 0502   BUN 6 05/25/2020 0502   CREATININE 0.64 05/25/2020 0502   CALCIUM 8.8 (L) 05/25/2020 0502   PROT 6.1 (L) 05/25/2020 0502   ALBUMIN 3.3 (L) 05/25/2020 0502   AST 15 05/25/2020 0502   ALT 15 05/25/2020 0502   ALKPHOS 61 05/25/2020 0502   BILITOT 0.6 05/25/2020 0502   GFRNONAA >60 05/25/2020 0502   GFRAA >60 10/05/2019 2227   Lipase     Component Value Date/Time   LIPASE 21 05/22/2020 0629       Studies/Results: DG ERCP  Result Date: 05/24/2020 CLINICAL DATA:  ERCP with biliary stent exchange for bile  leak. EXAM: ERCP TECHNIQUE: Multiple spot images obtained with the fluoroscopic device and submitted for interpretation post-procedure. FLUOROSCOPY TIME:  45 seconds COMPARISON:  ERCP-05/15/2020 Nuclear medicine HIDA scan-05/22/2020; 05/13/2020 CT abdomen and pelvis-05/21/2020; 05/13/2020 CT guided percutaneous drainage catheter placement into gallbladder fossa-05/14/2020 FINDINGS: Six spot fluoroscopic images of the right upper abdominal quadrant during ERCP are provided for review. Initial image demonstrates an ERCP probe overlying the right upper abdominal quadrant. Pre-existing internal biliary stent overlies expected location of the CBD. A percutaneous drainage catheter overlies expected location of the gallbladder fossa. Cholecystectomy clips. Subsequent images demonstrate interval removal of pre-existing biliary stent with selective cannulation and opacification of the CBD. There is opacification of the cystic duct without definitive evidence of contrast extravasation or passage of contrast to the level of the percutaneous drainage catheter however examination degraded secondary to technique. Completion images demonstrate placement of a new biliary stent overlying expected location of the CBD traversing the expected confluence with the cystic duct. IMPRESSION: ERCP with internal biliary stent exchange is above. These images were submitted for radiologic interpretation only. Please see the procedural report for the amount of contrast and the fluoroscopy time utilized. Electronically Signed   By: Simonne Come M.D.   On: 05/24/2020 11:20    Anti-infectives: Anti-infectives (From admission, onward)  Start     Dose/Rate Route Frequency Ordered Stop   05/24/20 1400  metroNIDAZOLE (FLAGYL) IVPB 500 mg        500 mg 100 mL/hr over 60 Minutes Intravenous Every 8 hours 05/24/20 1228     05/24/20 1400  ceFEPIme (MAXIPIME) 2 g in sodium chloride 0.9 % 100 mL IVPB        2 g 200 mL/hr over 30 Minutes  Intravenous Every 8 hours 05/24/20 1228     05/22/20 0730  meropenem (MERREM) 1 g in sodium chloride 0.9 % 100 mL IVPB  Status:  Discontinued        1 g 200 mL/hr over 30 Minutes Intravenous Every 8 hours 05/22/20 0634 05/24/20 1228       Assessment/Plan POD 16, s/p lap chole by DB on 5/2 with post op bile leak, s/p ERCP now with pancreatitis -s/p repeat ERCP yesterday.  Drain output already going down per patient. -stop flushing drain to help with healing of fistulous tract -adv to low fat diet and see how she does -surgically stable for DC home when felt stable by med/GI given tolerance of oral intake -has follow up with Dr. Magnus Ivan and IR next week on 5/24  FEN - HH diet/low fat VTE - Lovenox ID - maxipime, no indication for abx.  She does NOT have an intra-abdominal infection. Her bile leak is well controlled and does not need abx therapy   LOS: 3 days    Jordan Chung , [facility] Surgery 05/25/2020, 9:47 AM Please see Amion for pager number during day hours 7:00am-4:30pm or 7:00am -11:30am on weekends

## 2020-05-26 ENCOUNTER — Encounter (HOSPITAL_COMMUNITY): Payer: Self-pay | Admitting: Gastroenterology

## 2020-05-27 LAB — CULTURE, BLOOD (ROUTINE X 2)
Culture: NO GROWTH
Culture: NO GROWTH
Special Requests: ADEQUATE
Special Requests: ADEQUATE

## 2020-05-31 ENCOUNTER — Ambulatory Visit
Admission: RE | Admit: 2020-05-31 | Discharge: 2020-05-31 | Disposition: A | Discharge status: Home / Self Care | Payer: Medicaid Other | Source: Ambulatory Visit | Attending: Radiology | Admitting: Radiology

## 2020-05-31 ENCOUNTER — Encounter (HOSPITAL_COMMUNITY): Payer: Self-pay

## 2020-05-31 ENCOUNTER — Ambulatory Visit
Admission: RE | Admit: 2020-05-31 | Discharge: 2020-05-31 | Disposition: A | Discharge status: Home / Self Care | Payer: Medicaid Other | Source: Ambulatory Visit | Attending: Surgery | Admitting: Surgery

## 2020-05-31 ENCOUNTER — Other Ambulatory Visit: Payer: Medicaid Other

## 2020-05-31 ENCOUNTER — Other Ambulatory Visit: Payer: Self-pay

## 2020-05-31 ENCOUNTER — Inpatient Hospital Stay (HOSPITAL_COMMUNITY)
Admission: EM | Admit: 2020-05-31 | Discharge: 2020-06-04 | DRG: 393 | Disposition: A | Discharge status: Home / Self Care | Payer: Medicaid Other | Attending: Internal Medicine | Admitting: Internal Medicine

## 2020-05-31 ENCOUNTER — Emergency Department (HOSPITAL_COMMUNITY): Payer: Medicaid Other

## 2020-05-31 DIAGNOSIS — R7881 Bacteremia: Secondary | ICD-10-CM | POA: Diagnosis not present

## 2020-05-31 DIAGNOSIS — D6489 Other specified anemias: Secondary | ICD-10-CM | POA: Diagnosis present

## 2020-05-31 DIAGNOSIS — K9189 Other postprocedural complications and disorders of digestive system: Secondary | ICD-10-CM

## 2020-05-31 DIAGNOSIS — B9689 Other specified bacterial agents as the cause of diseases classified elsewhere: Secondary | ICD-10-CM

## 2020-05-31 DIAGNOSIS — A419 Sepsis, unspecified organism: Secondary | ICD-10-CM | POA: Diagnosis not present

## 2020-05-31 DIAGNOSIS — K838 Other specified diseases of biliary tract: Secondary | ICD-10-CM

## 2020-05-31 DIAGNOSIS — Y838 Other surgical procedures as the cause of abnormal reaction of the patient, or of later complication, without mention of misadventure at the time of the procedure: Secondary | ICD-10-CM | POA: Diagnosis present

## 2020-05-31 DIAGNOSIS — K839 Disease of biliary tract, unspecified: Secondary | ICD-10-CM

## 2020-05-31 DIAGNOSIS — D649 Anemia, unspecified: Secondary | ICD-10-CM | POA: Diagnosis present

## 2020-05-31 DIAGNOSIS — Z20822 Contact with and (suspected) exposure to covid-19: Secondary | ICD-10-CM | POA: Diagnosis present

## 2020-05-31 DIAGNOSIS — Z9049 Acquired absence of other specified parts of digestive tract: Secondary | ICD-10-CM

## 2020-05-31 DIAGNOSIS — Z1611 Resistance to penicillins: Secondary | ICD-10-CM | POA: Diagnosis present

## 2020-05-31 DIAGNOSIS — D72829 Elevated white blood cell count, unspecified: Secondary | ICD-10-CM | POA: Diagnosis not present

## 2020-05-31 DIAGNOSIS — B3731 Acute candidiasis of vulva and vagina: Secondary | ICD-10-CM

## 2020-05-31 DIAGNOSIS — B373 Candidiasis of vulva and vagina: Secondary | ICD-10-CM | POA: Diagnosis present

## 2020-05-31 DIAGNOSIS — R109 Unspecified abdominal pain: Secondary | ICD-10-CM | POA: Diagnosis present

## 2020-05-31 DIAGNOSIS — R6520 Severe sepsis without septic shock: Secondary | ICD-10-CM | POA: Diagnosis present

## 2020-05-31 DIAGNOSIS — A4159 Other Gram-negative sepsis: Secondary | ICD-10-CM | POA: Diagnosis present

## 2020-05-31 DIAGNOSIS — K8309 Other cholangitis: Secondary | ICD-10-CM | POA: Diagnosis not present

## 2020-05-31 DIAGNOSIS — R509 Fever, unspecified: Secondary | ICD-10-CM | POA: Diagnosis present

## 2020-05-31 HISTORY — PX: IR RADIOLOGIST EVAL & MGMT: IMG5224

## 2020-05-31 LAB — URINALYSIS, ROUTINE W REFLEX MICROSCOPIC
Bilirubin Urine: NEGATIVE
Glucose, UA: NEGATIVE mg/dL
Hgb urine dipstick: NEGATIVE
Ketones, ur: NEGATIVE mg/dL
Nitrite: NEGATIVE
Protein, ur: NEGATIVE mg/dL
Specific Gravity, Urine: 1.038 — ABNORMAL HIGH (ref 1.005–1.030)
pH: 6 (ref 5.0–8.0)

## 2020-05-31 LAB — RESP PANEL BY RT-PCR (FLU A&B, COVID) ARPGX2
Influenza A by PCR: NEGATIVE
Influenza B by PCR: NEGATIVE
SARS Coronavirus 2 by RT PCR: NEGATIVE

## 2020-05-31 LAB — CBC WITH DIFFERENTIAL/PLATELET
Abs Immature Granulocytes: 0.02 10*3/uL (ref 0.00–0.07)
Basophils Absolute: 0 10*3/uL (ref 0.0–0.1)
Basophils Relative: 0 %
Eosinophils Absolute: 0 10*3/uL (ref 0.0–0.5)
Eosinophils Relative: 1 %
HCT: 39.3 % (ref 36.0–46.0)
Hemoglobin: 12 g/dL (ref 12.0–15.0)
Immature Granulocytes: 0 %
Lymphocytes Relative: 21 %
Lymphs Abs: 1.6 10*3/uL (ref 0.7–4.0)
MCH: 24.1 pg — ABNORMAL LOW (ref 26.0–34.0)
MCHC: 30.5 g/dL (ref 30.0–36.0)
MCV: 78.9 fL — ABNORMAL LOW (ref 80.0–100.0)
Monocytes Absolute: 0.1 10*3/uL (ref 0.1–1.0)
Monocytes Relative: 1 %
Neutro Abs: 6.1 10*3/uL (ref 1.7–7.7)
Neutrophils Relative %: 77 %
Platelets: 430 10*3/uL — ABNORMAL HIGH (ref 150–400)
RBC: 4.98 MIL/uL (ref 3.87–5.11)
RDW: 18.1 % — ABNORMAL HIGH (ref 11.5–15.5)
WBC: 7.8 10*3/uL (ref 4.0–10.5)
nRBC: 0 % (ref 0.0–0.2)

## 2020-05-31 LAB — CBC
HCT: 32.3 % — ABNORMAL LOW (ref 36.0–46.0)
Hemoglobin: 10 g/dL — ABNORMAL LOW (ref 12.0–15.0)
MCH: 24.3 pg — ABNORMAL LOW (ref 26.0–34.0)
MCHC: 31 g/dL (ref 30.0–36.0)
MCV: 78.6 fL — ABNORMAL LOW (ref 80.0–100.0)
Platelets: 347 10*3/uL (ref 150–400)
RBC: 4.11 MIL/uL (ref 3.87–5.11)
RDW: 18 % — ABNORMAL HIGH (ref 11.5–15.5)
WBC: 15.2 10*3/uL — ABNORMAL HIGH (ref 4.0–10.5)
nRBC: 0 % (ref 0.0–0.2)

## 2020-05-31 LAB — LACTIC ACID, PLASMA
Lactic Acid, Venous: 2 mmol/L (ref 0.5–1.9)
Lactic Acid, Venous: 1.5 mmol/L (ref 0.5–1.9)

## 2020-05-31 LAB — COMPREHENSIVE METABOLIC PANEL
ALT: 29 U/L (ref 0–44)
AST: 27 U/L (ref 15–41)
Albumin: 4.2 g/dL (ref 3.5–5.0)
Alkaline Phosphatase: 97 U/L (ref 38–126)
Anion gap: 11 (ref 5–15)
BUN: 13 mg/dL (ref 6–20)
CO2: 26 mmol/L (ref 22–32)
Calcium: 9.3 mg/dL (ref 8.9–10.3)
Chloride: 100 mmol/L (ref 98–111)
Creatinine, Ser: 0.83 mg/dL (ref 0.44–1.00)
GFR, Estimated: >60 mL/min (ref >60)
Glucose, Bld: 98 mg/dL (ref 70–99)
Potassium: 4 mmol/L (ref 3.5–5.1)
Sodium: 137 mmol/L (ref 135–145)
Total Bilirubin: 0.7 mg/dL (ref 0.3–1.2)
Total Protein: 7.9 g/dL (ref 6.5–8.1)

## 2020-05-31 LAB — PROTIME-INR
INR: 1 (ref 0.8–1.2)
Prothrombin Time: 13.1 seconds (ref 11.4–15.2)

## 2020-05-31 LAB — APTT: aPTT: 23 seconds — ABNORMAL LOW (ref 24–36)

## 2020-05-31 LAB — LIPASE, BLOOD: Lipase: 43 U/L (ref 11–51)

## 2020-05-31 LAB — CREATININE, SERUM
Creatinine, Ser: 0.75 mg/dL (ref 0.44–1.00)
GFR, Estimated: >60 mL/min (ref >60)

## 2020-05-31 MED ORDER — METRONIDAZOLE 500 MG/100ML IV SOLN
500.0000 mg | Freq: Once | INTRAVENOUS | Status: AC
  Administered 2020-05-31: 500 mg via INTRAVENOUS
  Filled 2020-05-31: qty 100

## 2020-05-31 MED ORDER — ONDANSETRON HCL 4 MG/2ML IJ SOLN
4.0000 mg | Freq: Four times a day (QID) | INTRAMUSCULAR | Status: DC | PRN
  Administered 2020-05-31: 4 mg via INTRAVENOUS
  Filled 2020-05-31: qty 2

## 2020-05-31 MED ORDER — SCOPOLAMINE 1 MG/3DAYS TD PT72
1.0000 | MEDICATED_PATCH | TRANSDERMAL | Status: DC
  Administered 2020-05-31: 1.5 mg via TRANSDERMAL
  Filled 2020-05-31: qty 1

## 2020-05-31 MED ORDER — ACETAMINOPHEN 650 MG RE SUPP: 650.0000 mg | Freq: Four times a day (QID) | RECTAL | Status: DC | PRN

## 2020-05-31 MED ORDER — LACTATED RINGERS IV SOLN
INTRAVENOUS | Status: DC
  Administered 2020-05-31: 125 mL/h via INTRAVENOUS

## 2020-05-31 MED ORDER — SODIUM CHLORIDE 0.9 % IV SOLN
2.0000 g | INTRAVENOUS | Status: DC
  Administered 2020-05-31: 2 g via INTRAVENOUS
  Filled 2020-05-31 (×2): qty 20

## 2020-05-31 MED ORDER — ONDANSETRON HCL 4 MG/2ML IJ SOLN
4.0000 mg | Freq: Once | INTRAMUSCULAR | Status: AC
  Administered 2020-05-31: 4 mg via INTRAVENOUS
  Filled 2020-05-31: qty 2

## 2020-05-31 MED ORDER — ENOXAPARIN SODIUM 60 MG/0.6ML IJ SOSY
50.0000 mg | PREFILLED_SYRINGE | INTRAMUSCULAR | Status: DC
  Administered 2020-05-31 – 2020-06-03 (×4): 50 mg via SUBCUTANEOUS
  Filled 2020-05-31: qty 0.6
  Filled 2020-05-31: qty 0.5
  Filled 2020-05-31: qty 0.6
  Filled 2020-05-31: qty 0.5

## 2020-05-31 MED ORDER — IOPAMIDOL (ISOVUE-300) INJECTION 61%
100.0000 mL | Freq: Once | INTRAVENOUS | Status: AC | PRN
  Administered 2020-05-31: 100 mL via INTRAVENOUS

## 2020-05-31 MED ORDER — ACETAMINOPHEN 325 MG PO TABS
650.0000 mg | ORAL_TABLET | Freq: Once | ORAL | Status: AC
  Administered 2020-05-31: 650 mg via ORAL
  Filled 2020-05-31: qty 2

## 2020-05-31 MED ORDER — FENTANYL CITRATE (PF) 100 MCG/2ML IJ SOLN
50.0000 ug | Freq: Once | INTRAMUSCULAR | Status: AC
Start: 2020-05-31 — End: 2020-05-31
  Administered 2020-05-31: 50 ug via INTRAVENOUS
  Filled 2020-05-31: qty 2

## 2020-05-31 MED ORDER — METRONIDAZOLE 500 MG/100ML IV SOLN
500.0000 mg | Freq: Three times a day (TID) | INTRAVENOUS | Status: DC
  Administered 2020-05-31 – 2020-06-02 (×5): 500 mg via INTRAVENOUS
  Filled 2020-05-31 (×5): qty 100

## 2020-05-31 MED ORDER — ACETAMINOPHEN 325 MG PO TABS
650.0000 mg | ORAL_TABLET | Freq: Four times a day (QID) | ORAL | Status: DC | PRN
  Administered 2020-05-31 – 2020-06-02 (×5): 650 mg via ORAL
  Filled 2020-05-31 (×5): qty 2

## 2020-05-31 MED ORDER — SODIUM CHLORIDE 0.9 % IV SOLN
2.0000 g | Freq: Once | INTRAVENOUS | Status: AC
  Administered 2020-05-31: 2 g via INTRAVENOUS
  Filled 2020-05-31: qty 2

## 2020-05-31 MED ORDER — LACTATED RINGERS IV SOLN
INTRAVENOUS | Status: AC
  Administered 2020-05-31: 150 mL/h via INTRAVENOUS

## 2020-05-31 MED ORDER — ONDANSETRON HCL 4 MG PO TABS: 4.0000 mg | ORAL_TABLET | Freq: Four times a day (QID) | ORAL | Status: DC | PRN

## 2020-05-31 MED ORDER — LACTATED RINGERS IV BOLUS (SEPSIS)
1000.0000 mL | Freq: Once | INTRAVENOUS | Status: AC
  Administered 2020-05-31: 1000 mL via INTRAVENOUS

## 2020-05-31 NOTE — ED Triage Notes (Addendum)
Patient states she went to IR today and they did a CT and x-ray with Fluro with contrast. Patient's drain was flushed at this time. Patient states when she got home she began freezing and having chills. Patient called [facility] and was told they probably flushed some liver enzymes into her blood stream and to go to the ER ASAP;  Patient is actively vomiting in triage.

## 2020-05-31 NOTE — ED Notes (Signed)
Provider at the bedside to evaluate. 

## 2020-05-31 NOTE — Progress Notes (Signed)
Chief Complaint: Patient was seen in consultation today for GB fossa drain at the request of Allred,Darrell K  Referring Physician(s): Dr. Carman Chingoug Blackman  History of Present Illness: Jordan Chung is a 30 y.o. female who underwent laparoscopic cholecystectomy which was complicated by bile leak and biloma formation.  Percutaneous drain placed on 05/14/2020.  Subsequently, patient underwent ERCP with sphincterotomy and placement of a plastic biliary drain on 05/15/2020.  Patient presents today for follow-up evaluation.  She denies abdominal pain, nausea, vomiting or other she continues to have significant bilious output from the drainage catheter of approximately 30-40 mL daily.  CT scan performed today demonstrates resolution of the biloma and a well-positioned drainage catheter.  No evidence of biliary ductal dilatation.  With subsequent injection through her existing drain demonstrates a collapsed biloma cavity and a fistulous communication between the drain and the adjacent hepatic parenchyma.  There is focal parenchymal blush with intravasation of contrast material into portal venous branches and the middle hepatic vein.  Patient experienced some mild discomfort at the time of injection but this was transient.  Past Medical History:  Diagnosis Date  . Hyperemesis gravidarum   . Postpartum depression     Past Surgical History:  Procedure Laterality Date  . BILIARY STENT PLACEMENT N/A 05/15/2020   Procedure: BILIARY STENT PLACEMENT;  Surgeon: Kerin SalenKarki, Arya, MD;  Location: [facility];  Service: Gastroenterology;  Laterality: N/A;  . BILIARY STENT PLACEMENT N/A 05/24/2020   Procedure: BILIARY STENT PLACEMENT;  Surgeon: Willis Modenautlaw, William, MD;  Location: [facility];  Service: Endoscopy;  Laterality: N/A;  . CHOLECYSTECTOMY N/A 05/09/2020   Procedure: LAPAROSCOPIC CHOLECYSTECTOMY WITH INTRAOPERATIVE CHOLANGIOGRAM;  Surgeon: Abigail MiyamotoBlackman, Douglas, MD;  Location: [facility];  Service: General;  Laterality:  N/A;  . ENDOSCOPIC RETROGRADE CHOLANGIOPANCREATOGRAPHY (ERCP) WITH PROPOFOL N/A 05/15/2020   Procedure: ENDOSCOPIC RETROGRADE CHOLANGIOPANCREATOGRAPHY (ERCP) WITH PROPOFOL;  Surgeon: Kerin SalenKarki, Arya, MD;  Location: [facility];  Service: Gastroenterology;  Laterality: N/A;  . ERCP N/A 05/24/2020   Procedure: ENDOSCOPIC RETROGRADE CHOLANGIOPANCREATOGRAPHY (ERCP);  Surgeon: Willis Modenautlaw, William, MD;  [facility];  Service: Endoscopy;  Laterality: N/A;  . IR RADIOLOGIST EVAL & MGMT  05/31/2020  . PANCREATIC STENT PLACEMENT  05/15/2020   Procedure: PANCREATIC STENT PLACEMENT;  Surgeon: Kerin SalenKarki, Arya, MD;  Location: [facility];  Service: Gastroenterology;;  . Dennison MascotSPHINCTEROTOMY  05/15/2020   Procedure: Dennison MascotSPHINCTEROTOMY;  Surgeon: Kerin SalenKarki, Arya, MD;  Location: [facility];  Service: Gastroenterology;;  . Francine GravenSTENT REMOVAL  05/24/2020   Procedure: STENT REMOVAL;  Surgeon: Willis Modenautlaw, William, MD;  Location: [facility];  Service: Endoscopy;;    Allergies: Patient has no known allergies.  Medications: Prior to Admission medications   Medication Sig Start Date End Date Taking? Authorizing Provider  acetaminophen (TYLENOL) 325 MG tablet Take 2 tablets (650 mg total) by mouth every 6 (six) hours as needed for mild pain (or temp > 100). 05/17/20   Juliet RudeJohnson, Kelly R, PA-C  docusate sodium (COLACE) 100 MG capsule Take 1 capsule (100 mg total) by mouth 2 (two) times daily. Patient taking differently: Take 100 mg by mouth daily as needed for mild constipation. 12/05/19 12/04/20  Rasch, Victorino DikeJennifer I, NP  ibuprofen (ADVIL) 200 MG tablet Take 1 tablet (200 mg total) by mouth every 6 (six) hours as needed for moderate pain. 05/27/20   Zigmund DanielPowell, A Caldwell Jr., MD  methocarbamol (ROBAXIN) 500 MG tablet Take 1 tablet (500 mg total) by mouth every 6 (six) hours as needed for muscle spasms. 05/17/20   Juliet RudeJohnson, Kelly R, PA-C  ondansetron (ZOFRAN) 4 MG tablet Take 1 tablet (4 mg total) by mouth every 6 (six) hours. 05/11/20   Curatolo, Adam, DO   Prenatal Vit-Fe Fumarate-FA (MULTIVITAMIN-PRENATAL) 27-0.8 MG TABS tablet Take 1 tablet by mouth daily.    [provider]  dicyclomine (BENTYL) 20 MG tablet Take 1 tablet (20 mg total) by mouth 2 (two) times daily. 03/27/19 10/06/19  Lorelee New, PA-C     No family history on file.  Social History   Socioeconomic History  . Marital status: Divorced    Spouse name: Not on file  . Number of children: Not on file  . Years of education: Not on file  . Highest education level: Not on file  Occupational History  . Not on file  Tobacco Use  . Smoking status: Never Smoker  . Smokeless tobacco: Never Used  Vaping Use  . Vaping Use: Never used  Substance and Sexual Activity  . Alcohol use: Never  . Drug use: Never  . Sexual activity: Not on file  Other Topics Concern  . Not on file  Social History Narrative  . Not on file   Social Determinants of Health   Financial Resource Strain: Not on file  Food Insecurity: Not on file  Transportation Needs: Not on file  Physical Activity: Not on file  Stress: Not on file  Social Connections: Not on file    Review of Systems: A 12 point ROS discussed and pertinent positives are indicated in the HPI above.  All other systems are negative.  Review of Systems  Vital Signs: LMP 07/23/2019 (Approximate) Comment: Gravida 2, Para 1  Physical Exam Exam conducted with a chaperone present.  Constitutional:      General: She is not in acute distress.    Appearance: Normal appearance.  HENT:     Head: Normocephalic and atraumatic.  Eyes:     General: No scleral icterus. Cardiovascular:     Rate and Rhythm: Normal rate.  Pulmonary:     Effort: Pulmonary effort is normal.  Abdominal:     General: Abdomen is flat.     Palpations: Abdomen is soft.       Comments: Drain site clean/dry/intact  Skin:    General: Skin is warm and dry.  Neurological:     Mental Status: She is alert and oriented to person, place, and time.   Psychiatric:        Mood and Affect: Mood normal.        Behavior: Behavior normal.      Imaging: CT Abdomen Pelvis Wo Contrast  Result Date: 05/21/2020 CLINICAL DATA:  Abdominal pain and fever. Postop. Recent cholecystectomy complicated by bile leak with biliary stenting and percutaneous drainage. EXAM: CT ABDOMEN AND PELVIS WITHOUT CONTRAST TECHNIQUE: Multidetector CT imaging of the abdomen and pelvis was performed following the standard protocol without IV contrast. COMPARISON:  Most recent CT 05/13/2020 FINDINGS: Lower chest: Mild lower lobe atelectasis.  No pleural fluid. Hepatobiliary: Biliary stent in place decompressing the biliary tree. No intra or extrahepatic biliary ductal dilatation. Drainage catheter in the gallbladder fossa with resolution of previous fluid collection. No inflammation along the course of the catheter. No new hepatic abnormality. Pancreas: Faint peripancreatic stranding about the pancreatic head, series 2, image 34. No ductal dilatation. No acute pancreatic collection. Spleen: Normal in size without focal abnormality. Adrenals/Urinary Tract: Normal adrenal glands. Bilateral intrarenal calculi without hydronephrosis or perinephric edema. No ureteral stones. No perinephric edema. Urinary bladder is minimally distended. Stomach/Bowel: Patulous  distal esophagus. Unremarkable stomach. Normal positioning of the duodenum and ligament of Treitz. Normal small bowel without obstruction or inflammation. Normal appendix. Moderate colonic stool burden without colonic wall thickening or inflammation. Vascular/Lymphatic: Normal caliber abdominal aorta. Mild aortic atherosclerosis, advanced for age. No enlarged lymph nodes in the abdomen or pelvis. Reproductive: Uterus and bilateral adnexa are unremarkable. Other: Fluid collection in the gallbladder fossa has resolved after percutaneous drainage. No new intra-abdominal collection. No free fluid. No free air. Postsurgical change of the  anterior abdominal wall without subcutaneous collection. Musculoskeletal: There are no acute or suspicious osseous abnormalities. IMPRESSION: 1. Percutaneous drainage catheter in the gallbladder fossa with resolved fluid collection. 2. Biliary stent in place decompressing the biliary tree. No intra or extrahepatic biliary ductal dilatation. 3. Faint peripancreatic stranding about the pancreatic head, can be seen with acute pancreatitis. Recommend correlation with pancreatic enzymes. 4. Bilateral nonobstructing nephrolithiasis. 5. Mild aortic atherosclerosis is age advanced. Aortic Atherosclerosis (ICD10-I70.0). Electronically Signed   By: Narda Rutherford M.D.   On: 05/21/2020 23:46   DG Abdomen 1 View  Result Date: 05/21/2020 CLINICAL DATA:  Abdominal pain. Cholecystectomy 12 days ago, recent biliary stent placement. EXAM: ABDOMEN - 1 VIEW COMPARISON:  CT 05/13/2020 FINDINGS: Portable supine views of the abdomen obtained. Right upper quadrant biliary stent in place. Right upper quadrant drainage catheter in place. Surgical clips from cholecystectomy. No bowel dilatation or evidence of obstruction. Small to moderate colonic stool burden. Intrarenal calculi on recent CT are partially obscured by overlying stool. C shaped 2 cm small bore catheter projects over the left sacrum, unclear if this is external to the patient. Low lung volumes without significant pleural effusion or focal airspace disease. IMPRESSION: 1. Right upper quadrant biliary stent in place. Right upper quadrant drainage catheter in place. 2. Small bore C-shaped density projecting over the left pelvis is of unknown etiology, may be external to the patient. 3. Normal bowel gas pattern. Electronically Signed   By: Narda Rutherford M.D.   On: 05/21/2020 20:28   CT ABDOMEN PELVIS W CONTRAST  Result Date: 05/31/2020 CLINICAL DATA:  30 year old female with a history of bile leak following laparoscopic cholecystectomy. A percutaneous drainage  catheter was placed in the gallbladder fossa on 05/14/2020. Patient continues to have relatively high volume bilious output despite an endoscopically placed biliary stent. EXAM: CT ABDOMEN AND PELVIS WITH CONTRAST TECHNIQUE: Multidetector CT imaging of the abdomen and pelvis was performed using the standard protocol following bolus administration of intravenous contrast. CONTRAST:  ISOVUE-300 IOPAMIDOL (ISOVUE-300) INJECTION 61% COMPARISON:  Prior CT scan 05/21/2020 FINDINGS: Lower chest: No acute abnormality. Hepatobiliary: No biliary ductal dilatation. Percutaneous drainage catheter remains in good position. No evidence of undrained fluid collection. Small amount of pneumobilia not unexpected given the presence of the plastic biliary stent. Pancreas: Unremarkable. No pancreatic ductal dilatation or surrounding inflammatory changes. Spleen: Normal in size without focal abnormality. Adrenals/Urinary Tract: Normal adrenal glands. Bilateral nephrolithiasis again visualized without interval change. Unremarkable ureters and bladder. Stomach/Bowel: Stomach is within normal limits. Appendix appears normal. No evidence of bowel wall thickening, distention, or inflammatory changes. Vascular/Lymphatic: No significant vascular findings are present. No enlarged abdominal or pelvic lymph nodes. Reproductive: Uterus and bilateral adnexa are unremarkable. Other: No abdominal wall hernia or abnormality. No abdominopelvic ascites. Musculoskeletal: No acute or significant osseous findings. IMPRESSION: 1. Well-positioned percutaneous drainage catheter in the gallbladder fossa without evidence of residual or undrained fluid. 2. Well-positioned plastic biliary stent. Electronically Signed   By: Isac Caddy.D.  On: 05/31/2020 13:21   CT ABDOMEN PELVIS W CONTRAST  Result Date: 05/13/2020 CLINICAL DATA:  Recent cholecystectomy with pain EXAM: CT ABDOMEN AND PELVIS WITH CONTRAST TECHNIQUE: Multidetector CT imaging of the  abdomen and pelvis was performed using the standard protocol following bolus administration of intravenous contrast. CONTRAST:  OMNIPAQUE IOHEXOL 300 MG/ML  SOLN COMPARISON:  May 11, 2020 FINDINGS: Lower chest: There is bibasilar atelectasis. Lung bases otherwise are clear. Hepatobiliary: No focal liver lesions are appreciable. Gallbladder is absent. There is slightly more fluid in the gallbladder fossa region compared to 2 days prior with fluid collection in the gallbladder fossa region measuring 5.9 x 2.5 cm. This fluid collection is better defined than on study from 2 days prior. There is fat stranding in the medial gallbladder fossa region consistent with recent surgery, similar to 2 days prior. Trace air along the periphery of this fluid collection may be of postoperative etiology. A well-defined air collection within the fluid in the gallbladder fossa is not appreciable. Soft tissue stranding is noted along the lateral right abdomen consistent with recent surgery, essentially stable. There is no appreciable biliary duct dilatation. Pancreas: No pancreatic mass or inflammatory focus. Spleen: No splenic lesions are evident. Adrenals/Urinary Tract: Adrenals bilaterally appear normal. There is no evident mass or hydronephrosis on either side. There is a 2 mm calculus with an adjacent 3 mm calculus in the mid right kidney, unchanged. There is a 2 mm calculus in the mid left kidney, unchanged. No ureteral calculus evident on either side. Urinary bladder is midline with wall thickness within normal limits. Stomach/Bowel: There is no bowel wall or mesenteric thickening. No evident bowel obstruction. Terminal ileum appears normal. Appendix appears normal. There is no evident free air or portal venous air. Small amount of pneumoperitoneum seen 2 days prior no longer evident. Vascular/Lymphatic: No abdominal aortic aneurysm. No arterial vascular lesions are evident. Major venous structures appear patent. There is a  circumaortic left renal vein, an anatomic variant. No evident adenopathy in the abdomen or pelvis. Reproductive: Uterus is anteverted. No adnexal masses evident. There is a small amount of fluid in the cul-de-sac, stable. Other: No well-defined abscess in the abdomen or pelvis. Minimal fluid tracking along the right lateral conal fascia is likely secondary to recent surgery. No ascites beyond the mild fluid in the cul-de-sac. Musculoskeletal: No blastic or lytic bone lesions. No intramuscular lesions. Evidence of recent surgery at the umbilicus level. IMPRESSION: 1. There has been an increase in a well-defined fluid collection in the gallbladder fossa compared to 2 days prior with fluid in this area currently measuring 5.9 x 2.5 cm. No appreciable air within this collection noted. Early developing infectious lesion in this area must be of concern given the increase in fluid in this area. This fluid collection is somewhat better defined than 2 days prior. Close clinical and imaging surveillance in this regard advised. 2. Postoperative soft tissue stranding in the right abdomen with slight fluid tracking into the right lateral conal fascia. No evidence suggesting abscess in these areas. 3. No bowel wall thickening or bowel obstruction. Appendix appears normal. 4. Nonobstructing calculi in each kidney. No hydronephrosis or ureteral calculus. Urinary bladder wall thickness normal. Electronically Signed   By: Bretta Bang III M.D.   On: 05/13/2020 07:55   CT ABDOMEN PELVIS W CONTRAST  Result Date: 05/11/2020 CLINICAL DATA:  Laparoscopic cholecystectomy 05/09/2020, continued abdominal pain EXAM: CT ABDOMEN AND PELVIS WITH CONTRAST TECHNIQUE: Multidetector CT imaging of the abdomen and  pelvis was performed using the standard protocol following bolus administration of intravenous contrast. CONTRAST:  OMNIPAQUE IOHEXOL 300 MG/ML  SOLN COMPARISON:  05/09/2020, 03/27/2019 FINDINGS: Lower chest: No acute pleural  or parenchymal lung disease. Hepatobiliary: Postsurgical changes from cholecystectomy, with minimal fluid and fat stranding in the gallbladder fossa compatible with postsurgical change. No evidence of fluid collection or abscess. The liver is unremarkable without biliary dilation. Pancreas: Unremarkable. No pancreatic ductal dilatation or surrounding inflammatory changes. Spleen: Normal in size without focal abnormality. Adrenals/Urinary Tract: There are bilateral nonobstructing renal calculi. There are two 3 mm nonobstructing calculi on the right, with a single 3 mm calculus on the left. No obstructive uropathy within either kidney. The adrenals and bladder are unremarkable. Stomach/Bowel: No bowel obstruction or ileus. Normal appendix central upper pelvis. No bowel wall thickening or inflammatory change. Vascular/Lymphatic: Aortic atherosclerosis. No enlarged abdominal or pelvic lymph nodes. Reproductive: Uterus and bilateral adnexa are unremarkable. Other: There is a small amount of pneumoperitoneum within the upper abdomen consistent with recent laparoscopic surgery. Trace free fluid in the right upper quadrant and lower pelvis consistent with postsurgical change. Postsurgical change at the umbilicus from laparoscopy port. No fluid collection. No abdominal wall hernia. Musculoskeletal: No acute or destructive bony lesions. Reconstructed images demonstrate no additional findings. IMPRESSION: 1. Expected postsurgical changes from laparoscopic cholecystectomy. No evidence of fluid collection or abscess. 2. Bilateral nonobstructing renal calculi. 3.  Aortic Atherosclerosis (ICD10-I70.0). Electronically Signed   By: Sharlet Salina M.D.   On: 05/11/2020 19:01   US Abdomen Limited  Result Date: 05/13/2020 CLINICAL DATA:  Pain post cholecystectomy EXAM: ULTRASOUND ABDOMEN LIMITED RIGHT UPPER QUADRANT COMPARISON:  05/11/2020, ultrasound 05/09/2020 FINDINGS: Gallbladder: Post cholecystectomy. Question some fluid  towards the gallbladder fossa the exam was terminated prematurely due to patient discomfort. Common bile duct: Nonvisualized Liver: Incomplete assessment of the liver with only partial visualization of the left lobe. Other: Exam was terminated prematurely by the patient due to inability to tolerate scanning procedure with exquisite pain and tenderness upon transducer pressure. IMPRESSION: Premature termination of the exam due to patient discomfort and inability to tolerate scanning procedure. Post cholecystectomy. Question some fluid towards the gallbladder fossa. Minimal visualization of the left lobe liver. Nonvisualization of the common bile duct. Electronically Signed   By: Kreg Shropshire M.D.   On: 05/13/2020 06:55   DG Sinus/Fist Tube Chk-Non GI  Result Date: 05/31/2020 INDICATION: 30 year old female with persistent bilious output from a percutaneous drainage catheter in the gallbladder fossa despite resolution of biloma on CT imaging and a well-positioned plastic biliary stent. EXAM: Drain injection MEDICATIONS: None. ANESTHESIA/SEDATION: None. COMPLICATIONS: None immediate. PROCEDURE: A gentle hand injection of contrast material was performed under fluoroscopic imaging. The contrast opacifies the collapsed biloma cavity in the gallbladder fossa. There is a small fistulous communication extending into the hepatic parenchyma with parenchymal opacification as well as intravasation into both the portal venous and hepatic venous systems. No evidence of direct communication with the cystic duct remanent or biliary tree. IMPRESSION: 1. Resolved biloma. 2. 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 This likely represents the source of bilious weeping. There is no direct fistulous communication with the cystic duct or biliary tree itself.  Electronically Signed   By: Malachy Moan M.D.   On: 05/31/2020 13:27   DG Chest Port 1 View  Result Date: 05/31/2020 CLINICAL DATA:  Fever and chills following recent contrast injection in biloma drain EXAM: PORTABLE CHEST 1 VIEW COMPARISON:  05/22/2020 FINDINGS: Cardiac shadow is within normal limits. Overall inspiratory effort is poor. No focal infiltrate or effusion is seen. No bony abnormality is noted. IMPRESSION: No acute abnormality noted. Electronically Signed   By: Alcide Clever M.D.   On: 05/31/2020 16:21   DG CHEST PORT 1 VIEW  Result Date: 05/22/2020 CLINICAL DATA:  Increased shortness of breath. EXAM: PORTABLE CHEST 1 VIEW COMPARISON:  05/13/2020 FINDINGS: There is stable elevation of the RIGHT hemidiaphragm. Heart size is accentuated by technique. There are scattered areas of subsegmental atelectasis. No consolidations or pulmonary edema. IMPRESSION: Shallow inflation.  Subsegmental atelectasis. Electronically Signed   By: Norva Pavlov M.D.   On: 05/22/2020 14:25   DG CHEST PORT 1 VIEW  Result Date: 05/13/2020 CLINICAL DATA:  One month postpartum with recent laparoscopic cholecystectomy. Right upper quadrant abdominal pain. EXAM: PORTABLE CHEST 1 VIEW COMPARISON:  None. FINDINGS: The heart size and mediastinal contours are within normal limits. Low lung volumes. No focal airspace disease. The visualized skeletal structures are unremarkable. IMPRESSION: No evidence of acute cardiopulmonary disease. Electronically Signed   By: Caprice Renshaw   On: 05/13/2020 09:40   DG ERCP  Result Date: 05/24/2020 CLINICAL DATA:  ERCP with biliary stent exchange for bile leak. EXAM: ERCP TECHNIQUE: Multiple spot images obtained with the fluoroscopic device and submitted for interpretation post-procedure. FLUOROSCOPY TIME:  45 seconds COMPARISON:  ERCP-05/15/2020 Nuclear medicine HIDA scan-05/22/2020; 05/13/2020 CT abdomen and pelvis-05/21/2020; 05/13/2020 CT guided percutaneous drainage catheter  placement into gallbladder fossa-05/14/2020 FINDINGS: Six spot fluoroscopic images of the right upper abdominal quadrant during ERCP are provided for review. Initial image demonstrates an ERCP probe overlying the right upper abdominal quadrant. Pre-existing internal biliary stent overlies expected location of the CBD. A percutaneous drainage catheter overlies expected location of the gallbladder fossa. Cholecystectomy clips. Subsequent images demonstrate interval removal of pre-existing biliary stent with selective cannulation and opacification of the CBD. There is opacification of the cystic duct without definitive evidence of contrast extravasation or passage of contrast to the level of the percutaneous drainage catheter however examination degraded secondary to technique. Completion images demonstrate placement of a new biliary stent overlying expected location of the CBD traversing the expected confluence with the cystic duct. IMPRESSION: ERCP with internal biliary stent exchange is above. These images were submitted for radiologic interpretation only. Please see the procedural report for the amount of contrast and the fluoroscopy time utilized. Electronically Signed   By: Simonne Come M.D.   On: 05/24/2020 11:20   DG ERCP BILIARY & PANCREATIC DUCTS  Result Date: 05/15/2020 CLINICAL DATA:  30 year old female with bile leak following cholecystectomy. EXAM: ERCP TECHNIQUE: Multiple spot images obtained with the fluoroscopic device and submitted for interpretation post-procedure. FLUOROSCOPY TIME:  Fluoroscopy Time:  2 minutes 30 seconds Radiation Exposure Index (if provided by the fluoroscopic device): 58.3 mGy Number of Acquired Spot Images: 0 COMPARISON:  None. FINDINGS: A total of 7 intraoperative spot images are submitted for review. The images demonstrate a flexible endoscope in the descending duodenum. Initial wire cannulation of the pancreatic duct with placement of a plastic pancreatic stent.  Subsequently, the common bile duct is cannulated. Contrast injection performed. No frank extravasation on the provided images. A plastic biliary stent is then placed. Percutaneous drainage catheter noted in the right upper quadrant consistent with recent gallbladder fossa drain placement. IMPRESSION: ERCP with placement of pancreatic and biliary plastic stents. These  images were submitted for radiologic interpretation only. Please see the procedural report for the amount of contrast and the fluoroscopy time utilized. Electronically Signed   By: Malachy Moan M.D.   On: 05/15/2020 10:23   CT IMAGE GUIDED DRAINAGE BY PERCUTANEOUS CATHETER  Result Date: 05/14/2020 INDICATION: 30 year old female with history of biloma formation the gallbladder fossa status post laparoscopic cholecystectomy. EXAM: CT IMAGE GUIDED DRAINAGE BY PERCUTANEOUS CATHETER COMPARISON:  None. MEDICATIONS: The patient is currently admitted to the hospital and receiving intravenous antibiotics. The antibiotics were administered within an appropriate time frame prior to the initiation of the procedure. ANESTHESIA/SEDATION: Moderate (conscious) sedation was employed during this procedure. A total of Versed 4 mg and Fentanyl 100 mcg was administered intravenously. Moderate Sedation Time: 15 minutes. The patient's level of consciousness and vital signs were monitored continuously by radiology nursing throughout the procedure under my direct supervision. CONTRAST:  None COMPLICATIONS: None immediate. PROCEDURE: Informed written consent was obtained from the patient after a discussion of the risks, benefits and alternatives to treatment. The patient was placed supine on the CT gantry and a pre procedural CT was performed re-demonstrating the known abscess/fluid collection within the gallbladder fossa. The procedure was planned. A timeout was performed prior to the initiation of the procedure. The right upper quadrant was prepped and draped in the  usual sterile fashion. The overlying soft tissues were anesthetized with 1% lidocaine with epinephrine. Appropriate trajectory was planned with the use of a 22 gauge spinal needle. A 21 gauge Chiba needle was advanced into the abscess/fluid collection followed by placement of a 0.018 inch wire. Appropriate position was confirmed with a limited CT scan. Over the wire, a 6 [place] transition dilator was placed followed by a short Amplatz super stiff wire was coiled within the collection. Appropriate positioning was confirmed with a limited CT scan. The tract was serially dilated allowing placement of a 10.2 [place] all-purpose drainage catheter. Appropriate positioning was confirmed with a limited postprocedural CT scan. Approximately 20 ml of thin maroon/brown fluid was aspirated. The tube was connected to a bulb suction and sutured in place. A dressing was placed. The patient tolerated the procedure well without immediate post procedural complication. IMPRESSION: Successful CT guided placement of a 10.2 [place] all purpose drain catheter into the gallbladder fossa fluid collection with aspiration of approximately 20 mL of dark brown, maroon fluid. Samples were sent to the laboratory as requested by the ordering clinical team. Marliss Coots, MD [facility] Radiology Electronically Signed   By: Marliss Coots MD   On: 05/14/2020 15:02   NM HEPATOBILIARY LEAK (POST-SURGICAL)  Result Date: 05/22/2020 CLINICAL DATA:  Status post cholecystectomy with previously suspected biliary leak EXAM: NUCLEAR MEDICINE HEPATOBILIARY IMAGING TECHNIQUE: Sequential images of the abdomen were obtained out to 120 minutes following intravenous administration of radiopharmaceutical. Note that biliary drain clamped for this study. RADIOPHARMACEUTICALS:  5.4 mCi Tc-51m  Choletec IV COMPARISON:  May 13, 2020 nuclear medicine hepatobiliary imaging study; ERCP May 15, 2020. CT abdomen and pelvis May 21, 2020 FINDINGS: Liver uptake of radiotracer is normal. There is prompt visualization of small bowel indicating patency of the common bile duct. There is a small focus of radiotracer uptake in the region of the gallbladder fossa, similar to findings on the previous study. No other areas of abnormal radiotracer uptake noted. IMPRESSION: Persistent small focus of abnormal uptake in the gallbladder fossa region. A small persistent contained leak is suspected. Contrast in a gallbladder remnant could  present in this manner. Note that no gallbladder remnant evident on recent ERCP examination, supporting small residual contained biliary leak in the gallbladder fossa as a more likely differential consideration. Study otherwise unremarkable. Note patency of the common bile duct. Electronically Signed   By: Bretta Bang III M.D.   On: 05/22/2020 17:55   NM HEPATOBILIARY LEAK (POST-SURGICAL)  Result Date: 05/13/2020 CLINICAL DATA:  Status post cholecystectomy with right upper quadrant pain. EXAM: NUCLEAR MEDICINE HEPATOBILIARY IMAGING TECHNIQUE: Sequential images of the abdomen were obtained out to 60 minutes following intravenous administration of radiopharmaceutical. RADIOPHARMACEUTICALS:  4.9 mCi Tc-106m  Choletec IV COMPARISON:  CT 05/13/20 FINDINGS: Prompt uptake and biliary excretion of activity by the liver is seen. Biliary activity passes into small bowel, consistent with patent common bile duct. At the end of the first hour a small focus mild increased radiotracer accumulation is noted within the gallbladder fossa. During the second hour there is persistent and mildly progressive radiotracer accumulation within the gallbladder fossa. IMPRESSION: 1. Small focus of mild progressive radiotracer accumulation within the gallbladder fossa. Primary differential consideration includes: small contained leak versus radiotracer accumulation within gallbladder remnant. 2. Common bile duct appears patent with most of the  radiotracer accumulating within the small bowel loops. 3. These results were shared via EPIC CHAT at the time of interpretation on 05/13/2020 at 12:53 pm to provider [facility] , who acknowledged these results. Electronically Signed   By: Signa Kell M.D.   On: 05/13/2020 12:53   IR Radiologist Eval & Mgmt  Result Date: 05/31/2020 Please refer to notes tab for details about interventional procedure. (Op Note)  US Abdomen Limited RUQ (LIVER/GB)  Result Date: 05/09/2020 CLINICAL DATA:  Acute right upper quadrant pain EXAM: ULTRASOUND ABDOMEN LIMITED RIGHT UPPER QUADRANT COMPARISON:  CT 03/27/2019, ultrasound 03/25/2019 FINDINGS: Gallbladder: Echogenic, shadowing gallstone seen towards the gallbladder neck measuring to 1.8 cm in diameter. Gallbladder wall thickness is within normal limits. No pericholecystic fluid or inflammation. Sonographic Eulah Pont sign is reportedly negative. Common bile duct: Diameter: 7.3 mm, borderline dilated. No visible intraductal gallstones though portions of the distal common bile duct are poorly visualized. Liver: No focal lesion identified. Within normal limits in parenchymal echogenicity. Portal vein is patent on color Doppler imaging with normal direction of blood flow towards the liver. Other: None. IMPRESSION: 1. Cholelithiasis.  No evidence of acute cholecystitis. 2. Borderline biliary ductal dilatation. Recommend correlation with serologies and if abnormal, MRCP could be obtained. This recommendation follows ACR consensus guidelines: White Paper of the ACR Incidental Findings Committee II on Gallbladder and Biliary Findings. J Am Coll Radiol 2013;10:953-956. Electronically Signed   By: Kreg Shropshire M.D.   On: 05/09/2020 06:53    Labs:  CBC: Recent Labs    05/23/20 0539 05/24/20 0517 05/25/20 0502 05/31/20 1548  WBC 10.0 6.8 8.5 7.8  HGB 9.6* 9.3* 9.5* 12.0  HCT 31.7* 30.3* 31.5* 39.3  PLT 270 264 319 430*    COAGS: Recent Labs    05/31/20 1548  INR  1.0  APTT 23*    BMP: Recent Labs    10/05/19 2227 05/09/20 0428 05/22/20 0629 05/23/20 0539 05/24/20 0517 05/25/20 0502  NA 138   < > 136 140 140 143  K 3.6   < > 3.6 3.9 3.8 3.5  CL 107   < > 108 104 104 109  CO2 22   < > 25 30 29 27   GLUCOSE 108*   < > 110* 95 99 104*  BUN  5*   < > 9 5* 5* 6  CALCIUM 8.6*   < > 8.1* 8.7* 8.6* 8.8*  CREATININE 0.54   < > 0.63 0.65 0.53 0.64  GFRNONAA >60   < > >60 >60 >60 >60  GFRAA >60  --   --   --   --   --    < > = values in this interval not displayed.    LIVER FUNCTION TESTS: Recent Labs    05/22/20 0629 05/23/20 0539 05/24/20 0517 05/25/20 0502  BILITOT 0.7 1.1 0.6 0.6  AST 12* 10* 13* 15  ALT 16 14 13 15   ALKPHOS 74 70 64 61  PROT 5.9* 5.8* 5.7* 6.1*  ALBUMIN 3.2* 3.0* 2.9* 3.3*    TUMOR MARKERS: No results for input(s): AFPTM, CEA, CA199, CHROMGRNA in the last 8760 hours.  Assessment and Plan:  30 year old female with an indwelling percutaneous drain in the gallbladder fossa.  Despite apparent resolution of biloma on CT imaging, she continues to have persistent bilious output of approximately 30 mL daily.  Contrast injection through the tube today demonstrates a fistulous communication between the tube and the adjacent liver parenchyma with opacification of the portal and hepatic veins.  I suspect there is a small portion of the gallbladder fossa that is not yet healed and is communicating with the percutaneous drainage catheter.  There may be some accessory ducts of L[place]chka in the area resulting in the persistent bilious drainage.  No direct communication between the drainage catheter and the cystic duct or biliary tree was identified.  1.) No further flushing at this time to facilitate healing.  2.)  Our office staff will reach out to the patient in 2 weeks to inquire about drain output.  If decreasing, we will ask her to contact [phone] when drain output is minimal for at least 48 hours to come in for final evaluation and  drain removal.  If drain output remains unchanged at 2 weeks, I will have her come in to [facility] interventional radiology for a drain exchange and downsize in an effort to promote further healing.   Electronically Signed: [facility] 05/31/2020, 4:36 PM   I spent a total of  15 Minutes in face to face in clinical consultation, greater than 50% of which was counseling/coordinating care for percutaneous drain.

## 2020-05-31 NOTE — ED Provider Notes (Signed)
[facility]-EMERGENCY DEPT Provider Note   CSN: [id number] Arrival date & time: 05/31/20  1458     History Chief Complaint  Patient presents with  . Abdominal Pain  . Chills    Jordan Chung is a 30 y.o. female with a past medical history of pancreatitis, status post lap chole with biliary stent placement and continued leak since 05/09/20 who presents today for evaluation of multiple complaints. She states that earlier today she was at [facility] radiology IR and they flushed her JP drain with contrast. They then obtained CT scan with contrast.  Her imaging shows a persistent bile leak.  This ended at about 1:00 today. She states that she was then home and about 230 she had sudden onset of chills, uncontrollable shaking, nausea, vomiting, and feeling lightheaded. She denies any hives, itching, rash, or shortness of breath.  She has no history of allergic reaction to contrast and states she has had it before. She states that she called [facility] radiology, was told that she most likely has bacteria flushed into her bloodstream when they flush to the JP drain and to go immediately to the emergency room.  She states that prior to this she was having negligible abdominal pain, however it is now a 8 out of 10.    She states that a systolic BP in the 110s is normal for her.   She is currently breast feeding, has been pumping and dumping.    HPI     Past Medical History:  Diagnosis Date  . Hyperemesis gravidarum   . Postpartum depression     Patient Active Problem List   Diagnosis Date Noted  . Severe sepsis (HCC) 05/31/2020  . Bile leak 05/23/2020  . Abdominal pain 05/22/2020  . Acute pancreatitis 05/22/2020  . Post-ERCP acute pancreatitis 05/22/2020  . Anemia 05/22/2020  . SOB (shortness of breath) 05/22/2020  . Leukocytosis 05/22/2020  . S/P laparoscopic cholecystectomy 05/22/2020  . Post-op pain 05/13/2020    Past Surgical History:  Procedure  Laterality Date  . BILIARY STENT PLACEMENT N/A 05/15/2020   Procedure: BILIARY STENT PLACEMENT;  Surgeon: Kerin Salen, MD;  Location: [facility] ENDOSCOPY;  Service: Gastroenterology;  Laterality: N/A;  . BILIARY STENT PLACEMENT N/A 05/24/2020   Procedure: BILIARY STENT PLACEMENT;  Surgeon: Willis Modena, MD;  Location: [facility];  Service: Endoscopy;  Laterality: N/A;  . CHOLECYSTECTOMY N/A 05/09/2020   Procedure: LAPAROSCOPIC CHOLECYSTECTOMY WITH INTRAOPERATIVE CHOLANGIOGRAM;  Surgeon: Abigail Miyamoto, MD;  Location: [facility] ORS;  Service: General;  Laterality: N/A;  . ENDOSCOPIC RETROGRADE CHOLANGIOPANCREATOGRAPHY (ERCP) WITH PROPOFOL N/A 05/15/2020   Procedure: ENDOSCOPIC RETROGRADE CHOLANGIOPANCREATOGRAPHY (ERCP) WITH PROPOFOL;  Surgeon: Kerin Salen, MD;  Location: [facility];  Service: Gastroenterology;  Laterality: N/A;  . ERCP N/A 05/24/2020   Procedure: ENDOSCOPIC RETROGRADE CHOLANGIOPANCREATOGRAPHY (ERCP);  Surgeon: Willis Modena, MD;  Location: [facility];  Service: Endoscopy;  Laterality: N/A;  . IR RADIOLOGIST EVAL & MGMT  05/31/2020  . PANCREATIC STENT PLACEMENT  05/15/2020   Procedure: PANCREATIC STENT PLACEMENT;  Surgeon: Kerin Salen, MD;  Location: [facility];  Service: Gastroenterology;;  . [name]  05/15/2020   Procedure: [name];  Surgeon: Kerin Salen, MD;  Location: [facility];  Service: Gastroenterology;;  . Francine Graven REMOVAL  05/24/2020   Procedure: STENT REMOVAL;  Surgeon: Willis Modena, MD;  Location: [facility];  Service: Endoscopy;;     OB History    Gravida  2   Para  1   Term  1   Preterm  0  AB  0   Living  1     SAB  0   IAB  0   Ectopic  0   Multiple  0   Live Births  1           No family history on file.  Social History   Tobacco Use  . Smoking status: Never Smoker  . Smokeless tobacco: Never Used  Vaping Use  . Vaping Use: Never used  Substance Use Topics  . Alcohol use: Never  . Drug use: Never    Home  Medications Prior to Admission medications   Medication Sig Start Date End Date Taking? Authorizing Provider  acetaminophen (TYLENOL) 325 MG tablet Take 2 tablets (650 mg total) by mouth every 6 (six) hours as needed for mild pain (or temp > 100). 05/17/20   Juliet Rude, PA-C  docusate sodium (COLACE) 100 MG capsule Take 1 capsule (100 mg total) by mouth 2 (two) times daily. Patient taking differently: Take 100 mg by mouth daily as needed for mild constipation. 12/05/19 12/04/20  Rasch, Victorino Dike I, NP  ibuprofen (ADVIL) 200 MG tablet Take 1 tablet (200 mg total) by mouth every 6 (six) hours as needed for moderate pain. 05/27/20   Zigmund Daniel., MD  methocarbamol (ROBAXIN) 500 MG tablet Take 1 tablet (500 mg total) by mouth every 6 (six) hours as needed for muscle spasms. 05/17/20   Juliet Rude, PA-C  ondansetron (ZOFRAN) 4 MG tablet Take 1 tablet (4 mg total) by mouth every 6 (six) hours. 05/11/20   Curatolo, Adam, DO  Prenatal Vit-Fe Fumarate-FA (MULTIVITAMIN-PRENATAL) 27-0.8 MG TABS tablet Take 1 tablet by mouth daily.    [provider]  dicyclomine (BENTYL) 20 MG tablet Take 1 tablet (20 mg total) by mouth 2 (two) times daily. 03/27/19 10/06/19  Lorelee New, PA-C    Allergies    Patient has no known allergies.  Review of Systems   Review of Systems  Constitutional: Positive for chills and fatigue. Negative for fever.  HENT: Negative for sore throat, trouble swallowing and voice change.   Eyes: Negative for visual disturbance.  Respiratory: Negative for shortness of breath.   Cardiovascular: Negative for chest pain.  Gastrointestinal: Positive for abdominal pain, nausea and vomiting.  Genitourinary: Negative for dysuria.  Musculoskeletal: Negative for back pain and neck pain.  Skin: Negative for color change and rash.  Neurological: Positive for light-headedness. Negative for headaches.  Psychiatric/Behavioral: Negative for confusion.  All other systems  reviewed and are negative.   Physical Exam Updated Vital Signs BP (!) 98/57   Pulse 92   Temp 99.1 F (37.3 C) (Oral)   Resp (!) 21   Ht 5\' 8"  (1.727 m)   Wt 101.6 kg   LMP 07/23/2019 (Approximate) Comment: Gravida 2, Para 1  SpO2 98%   BMI 34.06 kg/m   Physical Exam Vitals and nursing note reviewed.  Constitutional:      Appearance: She is ill-appearing and diaphoretic.  HENT:     Head: Normocephalic and atraumatic.  Eyes:     General: No scleral icterus.       Right eye: No discharge.        Left eye: No discharge.     Conjunctiva/sclera: Conjunctivae normal.  Cardiovascular:     Rate and Rhythm: Regular rhythm. Tachycardia present.  Pulmonary:     Effort: Pulmonary effort is normal. No respiratory distress.     Breath sounds: No stridor.  Abdominal:  General: There is no distension.     Palpations: Abdomen is soft.     Tenderness: There is abdominal tenderness. There is guarding.     Comments: Diffuse abdominal TTP worse in the RUQ, epigastric, areas.    Musculoskeletal:        General: No deformity.     Cervical back: Normal range of motion and neck supple.  Skin:    General: Skin is warm.     Coloration: Skin is pale.  Neurological:     Mental Status: She is alert and oriented to person, place, and time.     Motor: No abnormal muscle tone.  Psychiatric:        Mood and Affect: Mood normal.        Behavior: Behavior normal.     ED Results / Procedures / Treatments   Labs (all labs ordered are listed, but only abnormal results are displayed) Labs Reviewed  CBC WITH DIFFERENTIAL/PLATELET - Abnormal; Notable for the following components:      Result Value   MCV 78.9 (*)    MCH 24.1 (*)    RDW 18.1 (*)    Platelets 430 (*)    All other components within normal limits  LACTIC ACID, PLASMA - Abnormal; Notable for the following components:   Lactic Acid, Venous 2.0 (*)    All other components within normal limits  URINALYSIS, ROUTINE W REFLEX  MICROSCOPIC - Abnormal; Notable for the following components:   Specific Gravity, Urine 1.038 (*)    Leukocytes,Ua TRACE (*)    Bacteria, UA RARE (*)    All other components within normal limits  APTT - Abnormal; Notable for the following components:   aPTT 23 (*)    All other components within normal limits  RESP PANEL BY RT-PCR (FLU A&B, COVID) ARPGX2  CULTURE, BLOOD (ROUTINE X 2)  CULTURE, BLOOD (ROUTINE X 2)  URINE CULTURE  COMPREHENSIVE METABOLIC PANEL  LIPASE, BLOOD  PROTIME-INR  LACTIC ACID, PLASMA  I-STAT BETA HCG BLOOD, ED ([facility], [facility], [facility] ONLY)    EKG None  Radiology CT ABDOMEN PELVIS WO CONTRAST  Result Date: 05/31/2020 CLINICAL DATA:  30 year old female with abdominal pain and fever. EXAM: CT ABDOMEN AND PELVIS WITHOUT CONTRAST TECHNIQUE: Multidetector CT imaging of the abdomen and pelvis was performed following the standard protocol without IV contrast. COMPARISON:  CT abdomen pelvis dated 05/21/2020. FINDINGS: Evaluation of this exam is limited in the absence of intravenous contrast. Lower chest: The visualized lung bases are clear. No intra-abdominal free air or free fluid. Hepatobiliary: The liver is unremarkable. No intrahepatic biliary dilatation. There is a percutaneous transhepatic pigtail drainage catheter with tip in the region of the gallbladder fossa. No residual fluid identified. Cholecystectomy. A biliary stent is again noted. There is minimal pneumobilia. No retained calcified stone noted in the central CBD or along the stent. Pancreas: Unremarkable. No pancreatic ductal dilatation or surrounding inflammatory changes. Spleen: Normal in size without focal abnormality. Adrenals/Urinary Tract: The adrenal glands unremarkable. There is no hydronephrosis on either side. Excreted contrast noted in the renal collecting system and urinary bladder. Stomach/Bowel: There is no bowel obstruction or active inflammation. The appendix is normal. Vascular/Lymphatic: The abdominal aorta  and IVC are grossly unremarkable on this noncontrast CT. No portal venous gas. There is no adenopathy. Reproductive: The uterus is anteverted and grossly unremarkable. There is a 3 cm left ovarian dominant follicle corpus luteum. The right ovary is unremarkable. Other: None Musculoskeletal: No acute or significant osseous findings. IMPRESSION:  1. No acute intra-abdominal or pelvic pathology. No change in position of the subhepatic drainage catheter or CBD stent compared to prior CT. No drainable fluid collection or abscess. 2. No bowel obstruction. Normal appendix. Electronically Signed   By: Elgie Collard M.D.   On: 05/31/2020 17:02   CT ABDOMEN PELVIS W CONTRAST  Result Date: 05/31/2020 CLINICAL DATA:  29 year old female with a history of bile leak following laparoscopic cholecystectomy. A percutaneous drainage catheter was placed in the gallbladder fossa on 05/14/2020. Patient continues to have relatively high volume bilious output despite an endoscopically placed biliary stent. EXAM: CT ABDOMEN AND PELVIS WITH CONTRAST TECHNIQUE: Multidetector CT imaging of the abdomen and pelvis was performed using the standard protocol following bolus administration of intravenous contrast. CONTRAST:  ISOVUE-300 IOPAMIDOL (ISOVUE-300) INJECTION 61% COMPARISON:  Prior CT scan 05/21/2020 FINDINGS: Lower chest: No acute abnormality. Hepatobiliary: No biliary ductal dilatation. Percutaneous drainage catheter remains in good position. No evidence of undrained fluid collection. Small amount of pneumobilia not unexpected given the presence of the plastic biliary stent. Pancreas: Unremarkable. No pancreatic ductal dilatation or surrounding inflammatory changes. Spleen: Normal in size without focal abnormality. Adrenals/Urinary Tract: Normal adrenal glands. Bilateral nephrolithiasis again visualized without interval change. Unremarkable ureters and bladder. Stomach/Bowel: Stomach is within normal limits. Appendix appears  normal. No evidence of bowel wall thickening, distention, or inflammatory changes. Vascular/Lymphatic: No significant vascular findings are present. No enlarged abdominal or pelvic lymph nodes. Reproductive: Uterus and bilateral adnexa are unremarkable. Other: No abdominal wall hernia or abnormality. No abdominopelvic ascites. Musculoskeletal: No acute or significant osseous findings. IMPRESSION: 1. Well-positioned percutaneous drainage catheter in the gallbladder fossa without evidence of residual or undrained fluid. 2. Well-positioned plastic biliary stent. Electronically Signed   By: Malachy Moan M.D.   On: 05/31/2020 13:21   DG Sinus/Fist Tube Chk-Non GI  Result Date: 05/31/2020 INDICATION: 30 year old female with persistent bilious output from a percutaneous drainage catheter in the gallbladder fossa despite resolution of biloma on CT imaging and a well-positioned plastic biliary stent. EXAM: Drain injection MEDICATIONS: None. ANESTHESIA/SEDATION: None. COMPLICATIONS: None immediate. PROCEDURE: A gentle hand injection of contrast material was performed under fluoroscopic imaging. The contrast opacifies the collapsed biloma cavity in the gallbladder fossa. There is a small fistulous communication extending into the hepatic parenchyma with parenchymal opacification as well as intravasation into both the portal venous and hepatic venous systems. No evidence of direct communication with the cystic duct remanent or biliary tree. IMPRESSION: 1. Resolved biloma. 2. 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 This likely represents the source of bilious weeping. There is no direct fistulous communication with the cystic duct or biliary tree itself. Electronically Signed   By: Malachy Moan M.D.   On: 05/31/2020 13:27   DG Chest Port 1  View  Result Date: 05/31/2020 CLINICAL DATA:  Fever and chills following recent contrast injection in biloma drain EXAM: PORTABLE CHEST 1 VIEW COMPARISON:  05/22/2020 FINDINGS: Cardiac shadow is within normal limits. Overall inspiratory effort is poor. No focal infiltrate or effusion is seen. No bony abnormality is noted. IMPRESSION: No acute abnormality noted. Electronically Signed   By: Alcide Clever M.D.   On: 05/31/2020 16:21   IR Radiologist Eval & Mgmt  Result Date: 05/31/2020 Please refer to notes tab for details about interventional procedure. (Op Note)   Procedures .Critical Care Performed by: Lyndel Safe  W, PA-C Authorized by: Cristina Gong, PA-C   Critical care provider statement:    Critical care time (minutes):  45   Critical care was time spent personally by me on the following activities:  Discussions with consultants, evaluation of patient's response to treatment, examination of patient, ordering and performing treatments and interventions, ordering and review of laboratory studies, ordering and review of radiographic studies, pulse oximetry, re-evaluation of patient's condition, obtaining history from patient or surrogate and review of old charts     Medications Ordered in ED Medications  lactated ringers infusion (150 mL/hr Intravenous New Bag/Given 05/31/20 2131)  enoxaparin (LOVENOX) injection 50 mg (50 mg Subcutaneous Given 05/31/20 2303)  lactated ringers infusion (125 mL/hr Intravenous New Bag/Given 05/31/20 2133)  cefTRIAXone (ROCEPHIN) 2 g in sodium chloride 0.9 % 100 mL IVPB (2 g Intravenous Not Given 06/01/20 0009)  metroNIDAZOLE (FLAGYL) IVPB 500 mg (500 mg Intravenous New Bag/Given 05/31/20 2139)  acetaminophen (TYLENOL) tablet 650 mg (650 mg Oral Given 05/31/20 2141)    Or  acetaminophen (TYLENOL) suppository 650 mg ( Rectal See Alternative 05/31/20 2141)  ondansetron (ZOFRAN) tablet 4 mg ( Oral See Alternative 05/31/20 2134)    Or  ondansetron  (ZOFRAN) injection 4 mg (4 mg Intravenous Given 05/31/20 2134)  scopolamine (TRANSDERM-SCOP) 1 MG/3DAYS 1.5 mg (1.5 mg Transdermal Patch Applied 05/31/20 2302)  lactated ringers bolus 1,000 mL (0 mLs Intravenous Stopped 05/31/20 2134)  ondansetron (ZOFRAN) injection 4 mg (4 mg Intravenous Given 05/31/20 1614)  ceFEPIme (MAXIPIME) 2 g in sodium chloride 0.9 % 100 mL IVPB (0 g Intravenous Stopped 05/31/20 2135)  metroNIDAZOLE (FLAGYL) IVPB 500 mg (0 mg Intravenous Stopped 05/31/20 2239)  fentaNYL (SUBLIMAZE) injection 50 mcg (50 mcg Intravenous Given 05/31/20 1614)  acetaminophen (TYLENOL) tablet 650 mg (650 mg Oral Given 05/31/20 1743)    ED Course  I have reviewed the triage vital signs and the nursing notes.  Pertinent labs & imaging results that were available during my care of the patient were reviewed by me and considered in my medical decision making (see chart for details).  Clinical Course as of 05/31/20 1839  Tue May 31, 2020  1559 I spoke with Dr. Malachy Moan who read her scans earlier today. He states that her JP drain when they flushed it with contrast showed that there is a communication into the liver and was taken into her veins and he suspects a gram-negative septicemia. Given that she does have increased pain recommend CT scan abdomen pelvis without contrast.   [EH]  1643 I attempted to reevaluate patient for her sepsis reevaluation, she is out of the room. [EH]  [id number] I spoke with Dr. Mikeal Hawthorne who will see patient for admission.  [EH]    Clinical Course User Index [EH] Norman Clay   MDM Rules/Calculators/A&P                         Patient is a 30 year old woman who presents today for evaluation of abdominal pain and chills.  This started about an hour after she had a IR procedure and they reportedly flush the drain with contrast and this was then up taken into the liver.  Given this history and the onset of symptoms I spoke with IR Dr. Archer Asa who suspects a  gram-negative septicemia.  This would fit given her fever, tachycardia, tachypnea, and reported rigors at home. Code sepsis is called, she is started on broad-spectrum intra-abdominal antibiotics.  She  did not require 30 mL/kg as she was not hypotensive and her lactic was not over 4 while in my care. After discussions with IR repeat CT abdomen without contrast is obtained without change.    Initial CBC, CMP are unremarkable.  Lipase is not elevated.  COVID and flu testing is negative.  Patient is recommended for admission.  I spoke with Dr. Mikeal Hawthorne who will see the patient.  The patient appears reasonably stabilized for admission considering the current resources, flow, and capabilities available in the ED at this time, and I doubt any other [facility] requiring further screening and/or treatment in the ED prior to admission assuming timely admission and bed placement.  Note: Portions of this report may have been transcribed using voice recognition software. Every effort was made to ensure accuracy; however, inadvertent computerized transcription errors may be present   Final Clinical Impression(s) / ED Diagnoses Final diagnoses:  Sepsis without acute organ dysfunction, due to unspecified organism [facility])  Bile leak    Rx / DC Orders ED Discharge Orders    None       Norman Clay 06/01/20 Glendell Docker, MD 06/01/20 1459

## 2020-05-31 NOTE — Progress Notes (Signed)
A consult was received from an ED physician for cefepime per pharmacy dosing.  The patient's profile has been reviewed for ht/wt/allergies/indication/available labs.   A one time order has been placed for cefepime 2g.  Further antibiotics/pharmacy consults should be ordered by admitting physician if indicated.                       Thank you, Loralee Pacas, PharmD, BCPS 05/31/2020  4:02 PM

## 2020-05-31 NOTE — H&P (Signed)
History and Physical   Jordan Boehringermma Hinds ZOX:[id number] DOB: 07/27/1990 DOA: 05/31/2020  Referring MD/NP/PA: Dr Estell HarpinZammit  PCP[facility] Gean BirchwoodPa, [facility]   Outpatient Specialists: Dr Timoteo AceMcCullough, Heather, IR   Patient coming from: Home  Chief Complaint: Fever and chills  HPI: Jordan Chung is a 30 y.o. female with medical history significant of laparoscopic cholecystectomy with biliary stent placement on May 2 with subsequent leak and JP drain placement.  Patient was seen today by IR where she had flushing of the JP drain with contrast.  CT scan was obtained after that.  This apparently showed persistent bile leak.  Patient went home and an hour and a half later started having chills uncontrollable shaking nausea vomiting and lightheadedness.  No hives.  No itching.  Patient came back to the emergency room where her vitals indicated ongoing sepsis.  Suspicion is the flushing related to some bacteremia leading to the sepsis.  She arrived tachycardic and diaphoretic.  Currently much better.  Heart rate is back to normal.  Cultures were obtained and patient being admitted with sepsis.  Patient is 8 weeks postpartum.  Her initial blood pressure was low indicating severe sepsis.  Currently responded to treatment with fluids..  ED Course: Temperature 101.9, initial blood pressure 74/60, pulse 122, respiratory of 30 and oxygen sat 98% on room air.  White count is 15.2, hemoglobin 10.0.  Platelets 347.  Urinalysis essentially negative lactic acid 1.5.  Chemistry essentially within normal.  Has CT abdomen pelvis earlier in the day did show continued bile leak.  Patient still have the JP drain in place.  She is being admitted for further evaluation.  Review of Systems: As per HPI otherwise 10 point review of systems negative.    Past Medical History:  Diagnosis Date  . Hyperemesis gravidarum   . Postpartum depression     Past Surgical History:  Procedure Laterality Date  . BILIARY STENT PLACEMENT N/A 05/15/2020    Procedure: BILIARY STENT PLACEMENT;  Surgeon: Kerin SalenKarki, Arya, MD;  Location: [facility];  Service: Gastroenterology;  Laterality: N/A;  . BILIARY STENT PLACEMENT N/A 05/24/2020   Procedure: BILIARY STENT PLACEMENT;  Surgeon: Willis Modenautlaw, William, MD;  Location: [facility];  Service: Endoscopy;  Laterality: N/A;  . CHOLECYSTECTOMY N/A 05/09/2020   Procedure: LAPAROSCOPIC CHOLECYSTECTOMY WITH INTRAOPERATIVE CHOLANGIOGRAM;  Surgeon: Abigail MiyamotoBlackman, Douglas, MD;  Location: [facility];  Service: General;  Laterality: N/A;  . ENDOSCOPIC RETROGRADE CHOLANGIOPANCREATOGRAPHY (ERCP) WITH PROPOFOL N/A 05/15/2020   Procedure: ENDOSCOPIC RETROGRADE CHOLANGIOPANCREATOGRAPHY (ERCP) WITH PROPOFOL;  Surgeon: Kerin SalenKarki, Arya, MD;  Location: [facility];  Service: Gastroenterology;  Laterality: N/A;  . ERCP N/A 05/24/2020   Procedure: ENDOSCOPIC RETROGRADE CHOLANGIOPANCREATOGRAPHY (ERCP);  Surgeon: Willis Modenautlaw, William, MD;  [facility];  Service: Endoscopy;  Laterality: N/A;  . IR RADIOLOGIST EVAL & MGMT  05/31/2020  . PANCREATIC STENT PLACEMENT  05/15/2020   Procedure: PANCREATIC STENT PLACEMENT;  Surgeon: Kerin SalenKarki, Arya, MD;  [facility];  Service: Gastroenterology;;  . Dennison MascotSPHINCTEROTOMY  05/15/2020   Procedure: Dennison MascotSPHINCTEROTOMY;  Surgeon: Kerin SalenKarki, Arya, MD;  Location[facility];  Service: Gastroenterology;;  . Francine GravenSTENT REMOVAL  05/24/2020   Procedure: STENT REMOVAL;  Surgeon: Willis Modenautlaw, William, MD;  Location: [facility];  Service: Endoscopy;;     reports that she has never smoked. She has never used smokeless tobacco. She reports that she does not drink alcohol and does not use drugs.  No Known Allergies  No family history on file.   Prior to Admission medications   Medication Sig Start Date End  Date Taking? Authorizing Provider  acetaminophen (TYLENOL) 325 MG tablet Take 2 tablets (650 mg total) by mouth every 6 (six) hours as needed for mild pain (or temp > 100). 05/17/20   Juliet Rude, PA-C  docusate sodium  (COLACE) 100 MG capsule Take 1 capsule (100 mg total) by mouth 2 (two) times daily. Patient taking differently: Take 100 mg by mouth daily as needed for mild constipation. 12/05/19 12/04/20  Rasch, Victorino Dike I, NP  ibuprofen (ADVIL) 200 MG tablet Take 1 tablet (200 mg total) by mouth every 6 (six) hours as needed for moderate pain. 05/27/20   Zigmund Daniel., MD  methocarbamol (ROBAXIN) 500 MG tablet Take 1 tablet (500 mg total) by mouth every 6 (six) hours as needed for muscle spasms. 05/17/20   Juliet Rude, PA-C  ondansetron (ZOFRAN) 4 MG tablet Take 1 tablet (4 mg total) by mouth every 6 (six) hours. 05/11/20   Curatolo, Adam, DO  Prenatal Vit-Fe Fumarate-FA (MULTIVITAMIN-PRENATAL) 27-0.8 MG TABS tablet Take 1 tablet by mouth daily.    [provider]  dicyclomine (BENTYL) 20 MG tablet Take 1 tablet (20 mg total) by mouth 2 (two) times daily. 03/27/19 10/06/19  Lorelee New, PA-C    Physical Exam: Vitals:   05/31/20 1744 05/31/20 1827 05/31/20 2140 05/31/20 2309  BP:  (!) 98/57 (!) 101/58   Pulse:  92 90   Resp:  (!) 21 20   Temp: 99.1 F (37.3 C)   98.4 F (36.9 C)  TempSrc: Oral   Oral  SpO2:  98% 98%   Weight:      Height:          Constitutional: Acutely ill looking, flushed, mild distress Vitals:   05/31/20 1744 05/31/20 1827 05/31/20 2140 05/31/20 2309  BP:  (!) 98/57 (!) 101/58   Pulse:  92 90   Resp:  (!) 21 20   Temp: 99.1 F (37.3 C)   98.4 F (36.9 C)  TempSrc: Oral   Oral  SpO2:  98% 98%   Weight:      Height:       Eyes: PERRL, lids and conjunctivae normal ENMT: Mucous membranes are moist. Posterior pharynx clear of any exudate or lesions.Normal dentition.  Neck: normal, supple, no masses, no thyromegaly Respiratory: clear to auscultation bilaterally, no wheezing, no crackles. Normal respiratory effort. No accessory muscle use.  Cardiovascular: Sinus tachycardia no murmurs / rubs / gallops. No extremity edema. 2+ pedal pulses. No carotid  bruits.  Abdomen: Right upper quadrant abdominal tenderness with a JP drain in place,, no masses palpated. No hepatosplenomegaly. Bowel sounds positive.  Musculoskeletal: no clubbing / cyanosis. No joint deformity upper and lower extremities. Good ROM, no contractures. Normal muscle tone.  Skin: no rashes, lesions, ulcers. No induration Neurologic: CN 2-12 grossly intact. Sensation intact, DTR normal. Strength 5/5 in all 4.  Psychiatric: Normal judgment and insight. Alert and oriented x 3. Normal mood.     Labs on Admission: I have personally reviewed following labs and imaging studies  CBC: Recent Labs  Lab 05/25/20 0502 05/31/20 1548 05/31/20 2143  WBC 8.5 7.8 15.2*  NEUTROABS 5.2 6.1  --   HGB 9.5* 12.0 10.0*  HCT 31.5* 39.3 32.3*  MCV 79.5* 78.9* 78.6*  PLT 319 430* 347   Basic Metabolic Panel: Recent Labs  Lab 05/25/20 0502 05/31/20 1548 05/31/20 2143  NA 143 137  --   K 3.5 4.0  --   CL 109 100  --  CO2 27 26  --   GLUCOSE 104* 98  --   BUN 6 13  --   CREATININE 0.64 0.83 0.75  CALCIUM 8.8* 9.3  --   MG 2.0  --   --    GFR: Estimated Creatinine Clearance: 125.9 mL/min (by C-G formula based on SCr of 0.75 mg/dL). Liver Function Tests: Recent Labs  Lab 05/25/20 0502 05/31/20 1548  AST 15 27  ALT 15 29  ALKPHOS 61 97  BILITOT 0.6 0.7  PROT 6.1* 7.9  ALBUMIN 3.3* 4.2   Recent Labs  Lab 05/31/20 1548  LIPASE 43   No results for input(s): AMMONIA in the last 168 hours. Coagulation Profile: Recent Labs  Lab 05/31/20 1548  INR 1.0   Cardiac Enzymes: No results for input(s): CKTOTAL, CKMB, CKMBINDEX, TROPONINI in the last 168 hours. BNP (last 3 results) No results for input(s): PROBNP in the last 8760 hours. HbA1C: No results for input(s): HGBA1C in the last 72 hours. CBG: Recent Labs  Lab 05/25/20 0002 05/25/20 0744  GLUCAP 106* 97   Lipid Profile: No results for input(s): CHOL, HDL, LDLCALC, TRIG, CHOLHDL, LDLDIRECT in the last 72  hours. Thyroid Function Tests: No results for input(s): TSH, T4TOTAL, FREET4, T3FREE, THYROIDAB in the last 72 hours. Anemia Panel: No results for input(s): VITAMINB12, FOLATE, FERRITIN, TIBC, IRON, RETICCTPCT in the last 72 hours. Urine analysis:    Component Value Date/Time   COLORURINE YELLOW 05/31/2020 1747   APPEARANCEUR CLEAR 05/31/2020 1747   LABSPEC 1.038 (H) 05/31/2020 1747   PHURINE 6.0 05/31/2020 1747   GLUCOSEU NEGATIVE 05/31/2020 1747   HGBUR NEGATIVE 05/31/2020 1747   BILIRUBINUR NEGATIVE 05/31/2020 1747   KETONESUR NEGATIVE 05/31/2020 1747   PROTEINUR NEGATIVE 05/31/2020 1747   NITRITE NEGATIVE 05/31/2020 1747   LEUKOCYTESUR TRACE (A) 05/31/2020 1747   Sepsis Labs: (procalcitonin:4,lacticidven:4) ) Recent Results (from the past 240 hour(s))  Culture, blood (Routine X 2) w Reflex to ID Panel     Status: None   Collection Time: 05/22/20 12:11 PM   Specimen: BLOOD  Result Value Ref Range Status   Specimen Description   Final    BLOOD LEFT ANTECUBITAL Performed at [facility], [address] [place], [place] [address]    Special Requests   Final    BOTTLES DRAWN AEROBIC ONLY Blood Culture adequate volume Performed at [facility], [address] [place], [place] [address]    Culture   Final    NO GROWTH 5 DAYS Performed at [facility], [address], [place], [place] [address]    Report Status 05/27/2020 FINAL  Final  Culture, blood (Routine X 2) w Reflex to ID Panel     Status: None   Collection Time: 05/22/20 12:11 PM   Specimen: BLOOD  Result Value Ref Range Status   Specimen Description   Final    BLOOD Performed by [facility] Performed at [facility], [address], [place], [place] [address]    Special Requests   Final    BOTTLES DRAWN AEROBIC ONLY Blood Culture adequate volume Performed at [facility], [address] [place], [place]  [address]    Culture   Final    NO GROWTH 5 DAYS Performed at [facility], [address], [place], [place] [address]    Report Status 05/27/2020 FINAL  Final  Resp Panel by RT-PCR (Flu A&B, Covid) Nasopharyngeal Swab     Status:  None   Collection Time: 05/31/20  3:48 PM   Specimen: Nasopharyngeal Swab; Nasopharyngeal(NP) swabs in vial transport medium  Result Value Ref Range Status   SARS Coronavirus 2 by RT PCR NEGATIVE NEGATIVE Final    Comment: (NOTE) SARS-CoV-2 target nucleic acids are NOT DETECTED.  The SARS-CoV-2 RNA is generally detectable in upper respiratory specimens during the acute phase of infection. The lowest concentration of SARS-CoV-2 viral copies this assay can detect is 138 copies/mL. A negative result does not preclude SARS-Cov-2 infection and should not be used as the sole basis for treatment or other patient management decisions. A negative result may occur with  improper specimen collection/handling, submission of specimen other than nasopharyngeal swab, presence of viral mutation(s) within the areas targeted by this assay, and inadequate number of viral copies(<138 copies/mL). A negative result must be combined with clinical observations, patient history, and epidemiological information. The expected result is Negative.  Fact Sheet for Patients:  BloggerCourse.com  Fact Sheet for Healthcare Providers:  SeriousBroker.it  This test is no t yet approved or cleared by the [place] FDA and  has been authorized for detection and/or diagnosis of SARS-CoV-2 by FDA under an Emergency Use Authorization (EUA). This EUA will remain  in effect (meaning this test can be used) for the duration of the COVID-19 declaration under Section 564(b)(1) of the Act, 21 U.S.C.section 360bbb-3(b)(1), unless the authorization is terminated  or revoked sooner.       Influenza A by PCR NEGATIVE NEGATIVE Final   Influenza  B by PCR NEGATIVE NEGATIVE Final    Comment: (NOTE) The Xpert Xpress SARS-CoV-2/FLU/RSV plus assay is intended as an aid in the diagnosis of influenza from Nasopharyngeal swab specimens and should not be used as a sole basis for treatment. Nasal washings and aspirates are unacceptable for Xpert Xpress SARS-CoV-2/FLU/RSV testing.  Fact Sheet for Patients: BloggerCourse.com  Fact Sheet for Healthcare Providers: SeriousBroker.it  This test is not yet approved or cleared by the [place] FDA and has been authorized for detection and/or diagnosis of SARS-CoV-2 by FDA under an Emergency Use Authorization (EUA). This EUA will remain in effect (meaning this test can be used) for the duration of the COVID-19 declaration under Section 564(b)(1) of the Act, 21 U.S.C. section 360bbb-3(b)(1), unless the authorization is terminated or revoked.  Performed at [facility], [address] [place], [place] [address]      Radiological Exams on Admission: CT ABDOMEN PELVIS WO CONTRAST  Result Date: 05/31/2020 CLINICAL DATA:  30 year old female with abdominal pain and fever. EXAM: CT ABDOMEN AND PELVIS WITHOUT CONTRAST TECHNIQUE: Multidetector CT imaging of the abdomen and pelvis was performed following the standard protocol without IV contrast. COMPARISON:  CT abdomen pelvis dated 05/21/2020. FINDINGS: Evaluation of this exam is limited in the absence of intravenous contrast. Lower chest: The visualized lung bases are clear. No intra-abdominal free air or free fluid. Hepatobiliary: The liver is unremarkable. No intrahepatic biliary dilatation. There is a percutaneous transhepatic pigtail drainage catheter with tip in the region of the gallbladder fossa. No residual fluid identified. Cholecystectomy. A biliary stent is again noted. There is minimal pneumobilia. No retained calcified stone noted in the central CBD or along the stent.  Pancreas: Unremarkable. No pancreatic ductal dilatation or surrounding inflammatory changes. Spleen: Normal in size without focal abnormality. Adrenals/Urinary Tract: The adrenal glands unremarkable. There is no hydronephrosis on either side. Excreted contrast noted in the renal collecting system and urinary bladder. Stomach/Bowel: There is no bowel obstruction  or active inflammation. The appendix is normal. Vascular/Lymphatic: The abdominal aorta and IVC are grossly unremarkable on this noncontrast CT. No portal venous gas. There is no adenopathy. Reproductive: The uterus is anteverted and grossly unremarkable. There is a 3 cm left ovarian dominant follicle corpus luteum. The right ovary is unremarkable. Other: None Musculoskeletal: No acute or significant osseous findings. IMPRESSION: 1. No acute intra-abdominal or pelvic pathology. No change in position of the subhepatic drainage catheter or CBD stent compared to prior CT. No drainable fluid collection or abscess. 2. No bowel obstruction. Normal appendix. Electronically Signed   By: Elgie Collard M.D.   On: 05/31/2020 17:02   CT ABDOMEN PELVIS W CONTRAST  Result Date: 05/31/2020 CLINICAL DATA:  30 year old female with a history of bile leak following laparoscopic cholecystectomy. A percutaneous drainage catheter was placed in the gallbladder fossa on 05/14/2020. Patient continues to have relatively high volume bilious output despite an endoscopically placed biliary stent. EXAM: CT ABDOMEN AND PELVIS WITH CONTRAST TECHNIQUE: Multidetector CT imaging of the abdomen and pelvis was performed using the standard protocol following bolus administration of intravenous contrast. CONTRAST:  ISOVUE-300 IOPAMIDOL (ISOVUE-300) INJECTION 61% COMPARISON:  Prior CT scan 05/21/2020 FINDINGS: Lower chest: No acute abnormality. Hepatobiliary: No biliary ductal dilatation. Percutaneous drainage catheter remains in good position. No evidence of undrained fluid  collection. Small amount of pneumobilia not unexpected given the presence of the plastic biliary stent. Pancreas: Unremarkable. No pancreatic ductal dilatation or surrounding inflammatory changes. Spleen: Normal in size without focal abnormality. Adrenals/Urinary Tract: Normal adrenal glands. Bilateral nephrolithiasis again visualized without interval change. Unremarkable ureters and bladder. Stomach/Bowel: Stomach is within normal limits. Appendix appears normal. No evidence of bowel wall thickening, distention, or inflammatory changes. Vascular/Lymphatic: No significant vascular findings are present. No enlarged abdominal or pelvic lymph nodes. Reproductive: Uterus and bilateral adnexa are unremarkable. Other: No abdominal wall hernia or abnormality. No abdominopelvic ascites. Musculoskeletal: No acute or significant osseous findings. IMPRESSION: 1. Well-positioned percutaneous drainage catheter in the gallbladder fossa without evidence of residual or undrained fluid. 2. Well-positioned plastic biliary stent. Electronically Signed   By: Malachy Moan M.D.   On: 05/31/2020 13:21   DG Sinus/Fist Tube Chk-Non GI  Result Date: 05/31/2020 INDICATION: 30 year old female with persistent bilious output from a percutaneous drainage catheter in the gallbladder fossa despite resolution of biloma on CT imaging and a well-positioned plastic biliary stent. EXAM: Drain injection MEDICATIONS: None. ANESTHESIA/SEDATION: None. COMPLICATIONS: None immediate. PROCEDURE: A gentle hand injection of contrast material was performed under fluoroscopic imaging. The contrast opacifies the collapsed biloma cavity in the gallbladder fossa. There is a small fistulous communication extending into the hepatic parenchyma with parenchymal opacification as well as intravasation into both the portal venous and hepatic venous systems. No evidence of direct communication with the cystic duct remanent or biliary tree. IMPRESSION: 1. Resolved  biloma. 2. 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 This likely represents the source of bilious weeping. There is no direct fistulous communication with the cystic duct or biliary tree itself. Electronically Signed   By: Malachy Moan M.D.   On: 05/31/2020 13:27   DG Chest Port 1 View  Result Date: 05/31/2020 CLINICAL DATA:  Fever and chills following recent contrast injection in biloma drain EXAM: PORTABLE CHEST 1 VIEW COMPARISON:  05/22/2020 FINDINGS: Cardiac shadow is within normal limits. Overall inspiratory effort is poor. No  focal infiltrate or effusion is seen. No bony abnormality is noted. IMPRESSION: No acute abnormality noted. Electronically Signed   By: Alcide Clever M.D.   On: 05/31/2020 16:21   IR Radiologist Eval & Mgmt  Result Date: 05/31/2020 Please refer to notes tab for details about interventional procedure. (Op Note)   EKG: Independently reviewed.  Sinus tachycardia otherwise no significant finding  Assessment/Plan Principal Problem:   Severe sepsis (HCC) Active Problems:   Abdominal pain   Anemia   Leukocytosis   S/P laparoscopic cholecystectomy   Bile leak     #1 severe sepsis: Suspected due to flushing of her JP drain and possible transient bacteremia.  Patient will be admitted.  Sepsis protocol follow-up.  IV fluids resuscitation and IV antibiotics.  Initiated Rocephin and metronidazole.  Monitor response closely.  #2 bile leak persistent: JP drain in place.  Continue per IR  #3 leukocytosis: Secondary to severe sepsis.  Continue monitoring.  #4 status post laparoscopic cholecystectomy: Continue supportive care  #5 abdominal pain: Stable.  Continue current treatment.   DVT prophylaxis: Lovenox Code Status: Full code Family Communication: Husband and son at bedside Disposition  Plan: Home Consults called: Interventional radiology Admission status: Inpatient  Severity of Illness: The appropriate patient status for this patient is INPATIENT. Inpatient status is judged to be reasonable and necessary in order to provide the required intensity of service to ensure the patient's safety. The patient's presenting symptoms, physical exam findings, and initial radiographic and laboratory data in the context of their chronic comorbidities is felt to place them at high risk for further clinical deterioration. Furthermore, it is not anticipated that the patient will be medically stable for discharge from the hospital within 2 midnights of admission. The following factors support the patient status of inpatient.   " The patient's presenting symptoms include fever and chills. " The worrisome physical exam findings include sepsis.. " The initial radiographic and laboratory data are worrisome because of evidence of sepsis with leukocytosis. " The chronic co-morbidities include bile leak.   * I certify that at the point of admission it is my clinical judgment that the patient will require inpatient hospital care spanning beyond 2 midnights from the point of admission due to high intensity of service, high risk for further deterioration and high frequency of surveillance required.Lonia Blood MD [facility] Pager [phone]  If 7PM-7AM, please contact night-coverage [email or web address] Password [facility]  05/31/2020, 11:27 PM

## 2020-05-31 NOTE — ED Notes (Signed)
Family at bedside. 

## 2020-05-31 NOTE — Sepsis Progress Note (Signed)
elink monitoring code sepsis.  

## 2020-05-31 NOTE — ED Notes (Signed)
ED TO INPATIENT HANDOFF REPORT  Name/Age/Gender Tonna Boehringer 30 y.o. female  Code Status    Code Status Orders  (From admission, onward)         Start     Ordered   05/31/20 2107  Full code  Continuous        05/31/20 2106        Code Status History    Date Active Date Inactive Code Status Order ID Comments User Context   05/22/2020 0251 05/25/2020 1749 Full Code [id number]  Eduard Clos, MD ED   05/13/2020 0922 05/17/2020 1550 Full Code [id number]  Maczis, Orvan Falconer ED   Advance Care Planning Activity      Home/SNF/Other Home  Chief Complaint Severe sepsis (HCC) [A41.9, R65.20]  Level of Care/Admitting Diagnosis ED Disposition    ED Disposition Condition Comment   Admit  Hospital Area: [facility] [[id number]]  Level of Care: Telemetry [5]  Admit to tele based on following criteria: Complex arrhythmia (Bradycardia/Tachycardia)  May admit patient to [facility] or [facility] if equivalent level of care is available:: No  Covid Evaluation: Asymptomatic Screening Protocol (No Symptoms)  Diagnosis: Severe sepsis [facility]) [[id number]]  Admitting Physician: Rometta Emery [[id number]]  Attending Physician: Rometta Emery [[id number]]  Estimated length of stay: past midnight tomorrow  Certification:: I certify this patient will need inpatient services for at least 2 midnights       Medical History Past Medical History:  Diagnosis Date  . Hyperemesis gravidarum   . Postpartum depression     Allergies No Known Allergies  IV Location/Drains/Wounds Patient Lines/Drains/Airways Status    Active Line/Drains/Airways    Name Placement date Placement time Site Days   Peripheral IV 05/24/20 Left Wrist 05/24/20  0925  Wrist  7   Peripheral IV 05/31/20 20 G Left Antecubital 05/31/20  1530  Antecubital  less than 1   Closed System Drain 1 RUQ Bulb (JP) 10 Fr. 05/14/20  1417  RUQ  17   GI Stent 10 Fr. 05/24/20  1102  --  7   Incision (Closed) 05/09/20 Abdomen  Other (Comment) 05/09/20  1119  -- 22   Incision - 4 Ports Abdomen Umbilicus Upper;Mid Right;Lateral Right;Medial 05/09/20  1135  -- 22          Labs/Imaging Results for orders placed or performed during the hospital encounter of 05/31/20 (from the past 48 hour(s))  Comprehensive metabolic panel     Status: None   Collection Time: 05/31/20  3:48 PM  Result Value Ref Range   Sodium 137 135 - 145 mmol/L   Potassium 4.0 3.5 - 5.1 mmol/L   Chloride 100 98 - 111 mmol/L   CO2 26 22 - 32 mmol/L   Glucose, Bld 98 70 - 99 mg/dL    Comment: Glucose reference range applies only to samples taken after fasting for at least 8 hours.   BUN 13 6 - 20 mg/dL   Creatinine, Ser 1.57 0.44 - 1.00 mg/dL   Calcium 9.3 8.9 - 26.2 mg/dL   Total Protein 7.9 6.5 - 8.1 g/dL   Albumin 4.2 3.5 - 5.0 g/dL   AST 27 15 - 41 U/L   ALT 29 0 - 44 U/L   Alkaline Phosphatase 97 38 - 126 U/L   Total Bilirubin 0.7 0.3 - 1.2 mg/dL   GFR, Estimated >03 >55 mL/min    Comment: (NOTE) Calculated using the CKD-EPI Creatinine Equation (2021)    Anion gap  11 5 - 15    Comment: Performed at [facility], [address], [place], [place] [address]  Lipase, blood     Status: None   Collection Time: 05/31/20  3:48 PM  Result Value Ref Range   Lipase 43 11 - 51 U/L    Comment: Performed at [facility], [address] [place], [place] [address]  CBC with Differential     Status: Abnormal   Collection Time: 05/31/20  3:48 PM  Result Value Ref Range   WBC 7.8 4.0 - 10.5 K/uL   RBC 4.98 3.87 - 5.11 MIL/uL   Hemoglobin 12.0 12.0 - 15.0 g/dL   HCT 86.5 78.4 - 69.6 %   MCV 78.9 (L) 80.0 - 100.0 fL   MCH 24.1 (L) 26.0 - 34.0 pg   MCHC 30.5 30.0 - 36.0 g/dL   RDW 29.5 (H) 28.4 - 13.2 %   Platelets 430 (H) 150 - 400 K/uL   nRBC 0.0 0.0 - 0.2 %   Neutrophils Relative % 77 %   Neutro Abs 6.1 1.7 - 7.7 K/uL   Lymphocytes Relative 21 %   Lymphs Abs 1.6 0.7 - 4.0 K/uL   Monocytes Relative 1  %   Monocytes Absolute 0.1 0.1 - 1.0 K/uL   Eosinophils Relative 1 %   Eosinophils Absolute 0.0 0.0 - 0.5 K/uL   Basophils Relative 0 %   Basophils Absolute 0.0 0.0 - 0.1 K/uL   Immature Granulocytes 0 %   Abs Immature Granulocytes 0.02 0.00 - 0.07 K/uL    Comment: Performed at [facility], [address], [place], [place] [address]  Lactic acid, plasma     Status: Abnormal   Collection Time: 05/31/20  3:48 PM  Result Value Ref Range   Lactic Acid, Venous 2.0 (HH) 0.5 - 1.9 mmol/L    Comment: CRITICAL RESULT CALLED TO, READ BACK BY AND VERIFIED WITH: M.QUICK, RN AT 1729 ON 05.24.22 BY N.THOMPSON Performed at [facility], [address] [place], [place] [address]   Resp Panel by RT-PCR (Flu A&B, Covid) Nasopharyngeal Swab     Status: None   Collection Time: 05/31/20  3:48 PM   Specimen: Nasopharyngeal Swab; Nasopharyngeal(NP) swabs in vial transport medium  Result Value Ref Range   SARS Coronavirus 2 by RT PCR NEGATIVE NEGATIVE    Comment: (NOTE) SARS-CoV-2 target nucleic acids are NOT DETECTED.  The SARS-CoV-2 RNA is generally detectable in upper respiratory specimens during the acute phase of infection. The lowest concentration of SARS-CoV-2 viral copies this assay can detect is 138 copies/mL. A negative result does not preclude SARS-Cov-2 infection and should not be used as the sole basis for treatment or other patient management decisions. A negative result may occur with  improper specimen collection/handling, submission of specimen other than nasopharyngeal swab, presence of viral mutation(s) within the areas targeted by this assay, and inadequate number of viral copies(<138 copies/mL). A negative result must be combined with clinical observations, patient history, and epidemiological information. The expected result is Negative.  Fact Sheet for Patients:  BloggerCourse.com  Fact Sheet for Healthcare  Providers:  SeriousBroker.it  This test is no t yet approved or cleared by the [place] FDA and  has been authorized for detection and/or diagnosis of SARS-CoV-2 by FDA under an Emergency Use Authorization (EUA). This EUA will remain  in effect (meaning this test can be used) for the duration of the COVID-19 declaration under Section 564(b)(1) of  the Act, 21 U.S.C.section 360bbb-3(b)(1), unless the authorization is terminated  or revoked sooner.       Influenza A by PCR NEGATIVE NEGATIVE   Influenza B by PCR NEGATIVE NEGATIVE    Comment: (NOTE) The Xpert Xpress SARS-CoV-2/FLU/RSV plus assay is intended as an aid in the diagnosis of influenza from Nasopharyngeal swab specimens and should not be used as a sole basis for treatment. Nasal washings and aspirates are unacceptable for Xpert Xpress SARS-CoV-2/FLU/RSV testing.  Fact Sheet for Patients: BloggerCourse.comhttps://www.fda.gov/media/152166/download  Fact Sheet for Healthcare Providers: SeriousBroker.ithttps://www.fda.gov/media/152162/download  This test is not yet approved or cleared by the [place] FDA and has been authorized for detection and/or diagnosis of SARS-CoV-2 by FDA under an Emergency Use Authorization (EUA). This EUA will remain in effect (meaning this test can be used) for the duration of the COVID-19 declaration under Section 564(b)(1) of the Act, 21 U.S.C. section 360bbb-3(b)(1), unless the authorization is terminated or revoked.  Performed at [facility], [address] [place], [place] [address]   Protime-INR     Status: None   Collection Time: 05/31/20  3:48 PM  Result Value Ref Range   Prothrombin Time 13.1 11.4 - 15.2 seconds   INR 1.0 0.8 - 1.2    Comment: (NOTE) INR goal varies based on device and disease states. Performed at [facility], [address] [place], [place] [address]   APTT     Status: Abnormal   Collection Time: 05/31/20  3:48 PM   Result Value Ref Range   aPTT 23 (L) 24 - 36 seconds    Comment: Performed at [facility], [address] [place], [place] [address]  Urinalysis, Routine w reflex microscopic Urine, Clean Catch     Status: Abnormal   Collection Time: 05/31/20  5:47 PM  Result Value Ref Range   Color, Urine YELLOW YELLOW   APPearance CLEAR CLEAR   Specific Gravity, Urine 1.038 (H) 1.005 - 1.030   pH 6.0 5.0 - 8.0   Glucose, UA NEGATIVE NEGATIVE mg/dL   Hgb urine dipstick NEGATIVE NEGATIVE   Bilirubin Urine NEGATIVE NEGATIVE   Ketones, ur NEGATIVE NEGATIVE mg/dL   Protein, ur NEGATIVE NEGATIVE mg/dL   Nitrite NEGATIVE NEGATIVE   Leukocytes,Ua TRACE (A) NEGATIVE   RBC / HPF 0-5 0 - 5 RBC/hpf   WBC, UA 0-5 0 - 5 WBC/hpf   Bacteria, UA RARE (A) NONE SEEN   Squamous Epithelial / LPF 0-5 0 - 5    Comment: Performed at [facility], [address] [place], [place] [address]  Lactic acid, plasma     Status: None   Collection Time: 05/31/20  6:23 PM  Result Value Ref Range   Lactic Acid, Venous 1.5 0.5 - 1.9 mmol/L    Comment: Performed at [facility], [address] [place], [place] [address]  CBC     Status: Abnormal   Collection Time: 05/31/20  9:43 PM  Result Value Ref Range   WBC 15.2 (H) 4.0 - 10.5 K/uL   RBC 4.11 3.87 - 5.11 MIL/uL   Hemoglobin 10.0 (L) 12.0 - 15.0 g/dL   HCT 13.032.3 (L) 86.536.0 - 78.446.0 %   MCV 78.6 (L) 80.0 - 100.0 fL   MCH 24.3 (L) 26.0 - 34.0 pg   MCHC 31.0 30.0 - 36.0 g/dL   RDW 69.618.0 (H) 29.511.5 - 28.415.5 %   Platelets 347 150 - 400 K/uL   nRBC 0.0 0.0 -  0.2 %    Comment: Performed at [facility], [address] [place], [place] [address]  Creatinine, serum     Status: None   Collection Time: 05/31/20  9:43 PM  Result Value Ref Range   Creatinine, Ser 0.75 0.44 - 1.00 mg/dL   GFR, Estimated >60 >45 mL/min    Comment: (NOTE) Calculated using the CKD-EPI Creatinine Equation (2021) Performed at  [facility], [address] [place], [place] [address]    CT ABDOMEN PELVIS WO CONTRAST  Result Date: 05/31/2020 CLINICAL DATA:  30 year old female with abdominal pain and fever. EXAM: CT ABDOMEN AND PELVIS WITHOUT CONTRAST TECHNIQUE: Multidetector CT imaging of the abdomen and pelvis was performed following the standard protocol without IV contrast. COMPARISON:  CT abdomen pelvis dated 05/21/2020. FINDINGS: Evaluation of this exam is limited in the absence of intravenous contrast. Lower chest: The visualized lung bases are clear. No intra-abdominal free air or free fluid. Hepatobiliary: The liver is unremarkable. No intrahepatic biliary dilatation. There is a percutaneous transhepatic pigtail drainage catheter with tip in the region of the gallbladder fossa. No residual fluid identified. Cholecystectomy. A biliary stent is again noted. There is minimal pneumobilia. No retained calcified stone noted in the central CBD or along the stent. Pancreas: Unremarkable. No pancreatic ductal dilatation or surrounding inflammatory changes. Spleen: Normal in size without focal abnormality. Adrenals/Urinary Tract: The adrenal glands unremarkable. There is no hydronephrosis on either side. Excreted contrast noted in the renal collecting system and urinary bladder. Stomach/Bowel: There is no bowel obstruction or active inflammation. The appendix is normal. Vascular/Lymphatic: The abdominal aorta and IVC are grossly unremarkable on this noncontrast CT. No portal venous gas. There is no adenopathy. Reproductive: The uterus is anteverted and grossly unremarkable. There is a 3 cm left ovarian dominant follicle corpus luteum. The right ovary is unremarkable. Other: None Musculoskeletal: No acute or significant osseous findings. IMPRESSION: 1. No acute intra-abdominal or pelvic pathology. No change in position of the subhepatic drainage catheter or CBD stent compared to prior CT. No drainable fluid collection  or abscess. 2. No bowel obstruction. Normal appendix. Electronically Signed   By: Elgie Collard M.D.   On: 05/31/2020 17:02   CT ABDOMEN PELVIS W CONTRAST  Result Date: 05/31/2020 CLINICAL DATA:  30 year old female with a history of bile leak following laparoscopic cholecystectomy. A percutaneous drainage catheter was placed in the gallbladder fossa on 05/14/2020. Patient continues to have relatively high volume bilious output despite an endoscopically placed biliary stent. EXAM: CT ABDOMEN AND PELVIS WITH CONTRAST TECHNIQUE: Multidetector CT imaging of the abdomen and pelvis was performed using the standard protocol following bolus administration of intravenous contrast. CONTRAST:  ISOVUE-300 IOPAMIDOL (ISOVUE-300) INJECTION 61% COMPARISON:  Prior CT scan 05/21/2020 FINDINGS: Lower chest: No acute abnormality. Hepatobiliary: No biliary ductal dilatation. Percutaneous drainage catheter remains in good position. No evidence of undrained fluid collection. Small amount of pneumobilia not unexpected given the presence of the plastic biliary stent. Pancreas: Unremarkable. No pancreatic ductal dilatation or surrounding inflammatory changes. Spleen: Normal in size without focal abnormality. Adrenals/Urinary Tract: Normal adrenal glands. Bilateral nephrolithiasis again visualized without interval change. Unremarkable ureters and bladder. Stomach/Bowel: Stomach is within normal limits. Appendix appears normal. No evidence of bowel wall thickening, distention, or inflammatory changes. Vascular/Lymphatic: No significant vascular findings are present. No enlarged abdominal or pelvic lymph nodes. Reproductive: Uterus and bilateral adnexa are unremarkable. Other: No abdominal wall hernia or abnormality. No abdominopelvic ascites. Musculoskeletal: No acute or significant osseous findings.  IMPRESSION: 1. Well-positioned percutaneous drainage catheter in the gallbladder fossa without evidence of residual or undrained  fluid. 2. Well-positioned plastic biliary stent. Electronically Signed   By: Malachy Moan M.D.   On: 05/31/2020 13:21   DG Sinus/Fist Tube Chk-Non GI  Result Date: 05/31/2020 INDICATION: 30 year old female with persistent bilious output from a percutaneous drainage catheter in the gallbladder fossa despite resolution of biloma on CT imaging and a well-positioned plastic biliary stent. EXAM: Drain injection MEDICATIONS: None. ANESTHESIA/SEDATION: None. COMPLICATIONS: None immediate. PROCEDURE: A gentle hand injection of contrast material was performed under fluoroscopic imaging. The contrast opacifies the collapsed biloma cavity in the gallbladder fossa. There is a small fistulous communication extending into the hepatic parenchyma with parenchymal opacification as well as intravasation into both the portal venous and hepatic venous systems. No evidence of direct communication with the cystic duct remanent or biliary tree. IMPRESSION: 1. Resolved biloma. 2. 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 This likely represents the source of bilious weeping. There is no direct fistulous communication with the cystic duct or biliary tree itself. Electronically Signed   By: Malachy Moan M.D.   On: 05/31/2020 13:27   DG Chest Port 1 View  Result Date: 05/31/2020 CLINICAL DATA:  Fever and chills following recent contrast injection in biloma drain EXAM: PORTABLE CHEST 1 VIEW COMPARISON:  05/22/2020 FINDINGS: Cardiac shadow is within normal limits. Overall inspiratory effort is poor. No focal infiltrate or effusion is seen. No bony abnormality is noted. IMPRESSION: No acute abnormality noted. Electronically Signed   By: Alcide Clever M.D.   On: 05/31/2020 16:21   IR Radiologist Eval & Mgmt  Result Date: 05/31/2020 Please refer to notes tab  for details about interventional procedure. (Op Note)   Pending Labs Unresulted Labs (From admission, onward)          Start     Ordered   06/07/20 0500  Creatinine, serum  (enoxaparin (LOVENOX)    CrCl >/= 30 ml/min)  Weekly,   R     Comments: while on enoxaparin therapy    05/31/20 2106   06/01/20 0500  Protime-INR  Tomorrow morning,   R        05/31/20 2106   06/01/20 0500  Cortisol-am, blood  Tomorrow morning,   R        05/31/20 2106   06/01/20 0500  Procalcitonin  Tomorrow morning,   R        05/31/20 2106   06/01/20 0500  CBC  Tomorrow morning,   R        05/31/20 2106   06/01/20 0500  Comprehensive metabolic panel  Tomorrow morning,   R        05/31/20 2106   05/31/20 1531  Culture, blood (routine x 2)  BLOOD CULTURE X 2,   STAT      05/31/20 1532   05/31/20 1531  Urine culture  ONCE - STAT,   STAT        05/31/20 1532          Vitals/Pain Today's Vitals   05/31/20 2131 05/31/20 2140 05/31/20 2301 05/31/20 2309  BP:  (!) 101/58    Pulse:  90    Resp:  20    Temp:    98.4 F (36.9 C)  TempSrc:    Oral  SpO2:  98%    Weight:  Height:      PainSc: 7   5      Isolation Precautions No active isolations  Medications Medications  lactated ringers infusion (150 mL/hr Intravenous New Bag/Given 05/31/20 2131)  enoxaparin (LOVENOX) injection 50 mg (50 mg Subcutaneous Given 05/31/20 2303)  lactated ringers infusion (125 mL/hr Intravenous New Bag/Given 05/31/20 2133)  cefTRIAXone (ROCEPHIN) 2 g in sodium chloride 0.9 % 100 mL IVPB (2 g Intravenous New Bag/Given 05/31/20 2131)  metroNIDAZOLE (FLAGYL) IVPB 500 mg (500 mg Intravenous New Bag/Given 05/31/20 2139)  acetaminophen (TYLENOL) tablet 650 mg (650 mg Oral Given 05/31/20 2141)    Or  acetaminophen (TYLENOL) suppository 650 mg ( Rectal See Alternative 05/31/20 2141)  ondansetron (ZOFRAN) tablet 4 mg ( Oral See Alternative 05/31/20 2134)    Or  ondansetron (ZOFRAN) injection 4 mg (4 mg Intravenous Given 05/31/20  2134)  scopolamine (TRANSDERM-SCOP) 1 MG/3DAYS 1.5 mg (1.5 mg Transdermal Patch Applied 05/31/20 2302)  lactated ringers bolus 1,000 mL (0 mLs Intravenous Stopped 05/31/20 2134)  ondansetron (ZOFRAN) injection 4 mg (4 mg Intravenous Given 05/31/20 1614)  ceFEPIme (MAXIPIME) 2 g in sodium chloride 0.9 % 100 mL IVPB (0 g Intravenous Stopped 05/31/20 2135)  metroNIDAZOLE (FLAGYL) IVPB 500 mg (0 mg Intravenous Stopped 05/31/20 2239)  fentaNYL (SUBLIMAZE) injection 50 mcg (50 mcg Intravenous Given 05/31/20 1614)  acetaminophen (TYLENOL) tablet 650 mg (650 mg Oral Given 05/31/20 1743)    Mobility walks

## 2020-06-01 DIAGNOSIS — D72829 Elevated white blood cell count, unspecified: Secondary | ICD-10-CM | POA: Diagnosis not present

## 2020-06-01 DIAGNOSIS — Z9049 Acquired absence of other specified parts of digestive tract: Secondary | ICD-10-CM

## 2020-06-01 DIAGNOSIS — B3731 Acute candidiasis of vulva and vagina: Secondary | ICD-10-CM

## 2020-06-01 DIAGNOSIS — D649 Anemia, unspecified: Secondary | ICD-10-CM | POA: Diagnosis not present

## 2020-06-01 DIAGNOSIS — B373 Candidiasis of vulva and vagina: Secondary | ICD-10-CM

## 2020-06-01 DIAGNOSIS — A419 Sepsis, unspecified organism: Secondary | ICD-10-CM | POA: Diagnosis not present

## 2020-06-01 DIAGNOSIS — R7881 Bacteremia: Secondary | ICD-10-CM

## 2020-06-01 LAB — BLOOD CULTURE ID PANEL (REFLEXED) - BCID2

## 2020-06-01 LAB — COMPREHENSIVE METABOLIC PANEL
ALT: 23 U/L (ref 0–44)
AST: 20 U/L (ref 15–41)
Albumin: 3.2 g/dL — ABNORMAL LOW (ref 3.5–5.0)
Alkaline Phosphatase: 71 U/L (ref 38–126)
Anion gap: 6 (ref 5–15)
BUN: 9 mg/dL (ref 6–20)
CO2: 27 mmol/L (ref 22–32)
Calcium: 8.3 mg/dL — ABNORMAL LOW (ref 8.9–10.3)
Chloride: 103 mmol/L (ref 98–111)
Creatinine, Ser: 0.76 mg/dL (ref 0.44–1.00)
GFR, Estimated: >60 mL/min (ref >60)
Glucose, Bld: 109 mg/dL — ABNORMAL HIGH (ref 70–99)
Potassium: 3.8 mmol/L (ref 3.5–5.1)
Sodium: 136 mmol/L (ref 135–145)
Total Bilirubin: 1 mg/dL (ref 0.3–1.2)
Total Protein: 6.1 g/dL — ABNORMAL LOW (ref 6.5–8.1)

## 2020-06-01 LAB — CBC
HCT: 31 % — ABNORMAL LOW (ref 36.0–46.0)
Hemoglobin: 9.6 g/dL — ABNORMAL LOW (ref 12.0–15.0)
MCH: 24.1 pg — ABNORMAL LOW (ref 26.0–34.0)
MCHC: 31 g/dL (ref 30.0–36.0)
MCV: 77.9 fL — ABNORMAL LOW (ref 80.0–100.0)
Platelets: 296 10*3/uL (ref 150–400)
RBC: 3.98 MIL/uL (ref 3.87–5.11)
RDW: 18 % — ABNORMAL HIGH (ref 11.5–15.5)
WBC: 12.7 10*3/uL — ABNORMAL HIGH (ref 4.0–10.5)
nRBC: 0 % (ref 0.0–0.2)

## 2020-06-01 LAB — PROTIME-INR
INR: 1.2 (ref 0.8–1.2)
Prothrombin Time: 15.1 seconds (ref 11.4–15.2)

## 2020-06-01 LAB — CORTISOL-AM, BLOOD: Cortisol - AM: 9.3 ug/dL (ref 6.7–22.6)

## 2020-06-01 LAB — PROCALCITONIN: Procalcitonin: 42.99 ng/mL

## 2020-06-01 MED ORDER — SACCHAROMYCES BOULARDII 250 MG PO CAPS
250.0000 mg | ORAL_CAPSULE | Freq: Two times a day (BID) | ORAL | Status: DC
  Administered 2020-06-01 – 2020-06-04 (×6): 250 mg via ORAL
  Filled 2020-06-01 (×6): qty 1

## 2020-06-01 MED ORDER — CLOTRIMAZOLE 1 % VA CREA
1.0000 | TOPICAL_CREAM | Freq: Every day | VAGINAL | Status: DC
  Administered 2020-06-02 – 2020-06-03 (×2): 1 via VAGINAL
  Filled 2020-06-01: qty 45

## 2020-06-01 MED ORDER — OXYCODONE HCL 5 MG PO TABS
5.0000 mg | ORAL_TABLET | Freq: Four times a day (QID) | ORAL | Status: DC | PRN
Start: 2020-06-01 — End: 2020-06-02
  Administered 2020-06-01: 10 mg via ORAL
  Filled 2020-06-01 (×2): qty 2

## 2020-06-01 MED ORDER — CLOTRIMAZOLE 2 % VA CREA
1.0000 | TOPICAL_CREAM | Freq: Every day | VAGINAL | Status: DC
  Filled 2020-06-01: qty 21

## 2020-06-01 MED ORDER — FLUCONAZOLE 150 MG PO TABS
150.0000 mg | ORAL_TABLET | Freq: Once | ORAL | Status: AC
  Administered 2020-06-01: 150 mg via ORAL
  Filled 2020-06-01: qty 1

## 2020-06-01 MED ORDER — SODIUM CHLORIDE 0.9 % IV BOLUS
1000.0000 mL | Freq: Once | INTRAVENOUS | Status: AC
  Administered 2020-06-01: 1000 mL via INTRAVENOUS

## 2020-06-01 MED ORDER — HYDROMORPHONE HCL 1 MG/ML IJ SOLN
1.0000 mg | INTRAMUSCULAR | Status: AC | PRN
  Administered 2020-06-01 (×2): 1 mg via INTRAVENOUS
  Filled 2020-06-01 (×2): qty 1

## 2020-06-01 MED ORDER — SODIUM CHLORIDE 0.9 % IV SOLN: INTRAVENOUS | Status: DC | PRN

## 2020-06-01 MED ORDER — SODIUM CHLORIDE 0.9 % IV BOLUS
500.0000 mL | Freq: Once | INTRAVENOUS | Status: AC
  Administered 2020-06-01: 500 mL via INTRAVENOUS

## 2020-06-01 MED ORDER — HYDROMORPHONE HCL 1 MG/ML IJ SOLN: 0.5000 mg | INTRAMUSCULAR | Status: DC | PRN

## 2020-06-01 MED ORDER — HYDROMORPHONE HCL 1 MG/ML IJ SOLN: 1.0000 mg | INTRAMUSCULAR | Status: DC | PRN

## 2020-06-01 MED ORDER — SODIUM CHLORIDE 0.9 % IV SOLN
2.0000 g | Freq: Three times a day (TID) | INTRAVENOUS | Status: DC
  Administered 2020-06-01 – 2020-06-04 (×9): 2 g via INTRAVENOUS
  Filled 2020-06-01 (×12): qty 2

## 2020-06-01 NOTE — ED Notes (Signed)
Pt reports feeling warmer. Temp 98.34f oral. Will continue to monitor per unit standard

## 2020-06-01 NOTE — Progress Notes (Signed)
PHARMACY - PHYSICIAN COMMUNICATION CRITICAL VALUE ALERT - BLOOD CULTURE IDENTIFICATION (BCID)  Jordan Chung is an 30 y.o. female who presented to [facility] on 05/31/2020 with a chief complaint of fever chills following flushing of JP drain, s/p biliary stent placement 05/09/20.  Assessment:  2 of 4 bottles (1 bottle of each set) growing GNR, Enterobacter cloacea  Name of physician (or Provider) Contacted: Dr. Janee Morn  Current antibiotics: Ceftriaxone/Flagyl  Changes to prescribed antibiotics recommended:  Recommendations accepted by provider - change ceftriaxone to cefepime 2g IV q8h  Results for orders placed or performed during the hospital encounter of 05/31/20  Blood Culture ID Panel (Reflexed) (Collected: 05/31/2020  3:48 PM)  Result Value Ref Range   Enterococcus faecalis NOT DETECTED NOT DETECTED   Enterococcus Faecium NOT DETECTED NOT DETECTED   Listeria monocytogenes NOT DETECTED NOT DETECTED   Staphylococcus species NOT DETECTED NOT DETECTED   Staphylococcus aureus (BCID) NOT DETECTED NOT DETECTED   Staphylococcus epidermidis NOT DETECTED NOT DETECTED   Staphylococcus lugdunensis NOT DETECTED NOT DETECTED   Streptococcus species NOT DETECTED NOT DETECTED   Streptococcus agalactiae NOT DETECTED NOT DETECTED   Streptococcus pneumoniae NOT DETECTED NOT DETECTED   Streptococcus pyogenes NOT DETECTED NOT DETECTED   A.calcoaceticus-baumannii NOT DETECTED NOT DETECTED   Bacteroides fragilis NOT DETECTED NOT DETECTED   Enterobacterales DETECTED (A) NOT DETECTED   Enterobacter cloacae complex DETECTED (A) NOT DETECTED   Escherichia coli NOT DETECTED NOT DETECTED   Klebsiella aerogenes NOT DETECTED NOT DETECTED   Klebsiella oxytoca NOT DETECTED NOT DETECTED   Klebsiella pneumoniae NOT DETECTED NOT DETECTED   Proteus species NOT DETECTED NOT DETECTED   Salmonella species NOT DETECTED NOT DETECTED   Serratia marcescens NOT DETECTED NOT DETECTED   Haemophilus influenzae NOT DETECTED  NOT DETECTED   Neisseria meningitidis NOT DETECTED NOT DETECTED   Pseudomonas aeruginosa NOT DETECTED NOT DETECTED   Stenotrophomonas maltophilia NOT DETECTED NOT DETECTED   Candida albicans NOT DETECTED NOT DETECTED   Candida auris NOT DETECTED NOT DETECTED   Candida glabrata NOT DETECTED NOT DETECTED   Candida krusei NOT DETECTED NOT DETECTED   Candida parapsilosis NOT DETECTED NOT DETECTED   Candida tropicalis NOT DETECTED NOT DETECTED   Cryptococcus neoformans/gattii NOT DETECTED NOT DETECTED   CTX-M ESBL NOT DETECTED NOT DETECTED   Carbapenem resistance IMP NOT DETECTED NOT DETECTED   Carbapenem resistance KPC NOT DETECTED NOT DETECTED   Carbapenem resistance NDM NOT DETECTED NOT DETECTED   Carbapenem resist OXA 48 LIKE NOT DETECTED NOT DETECTED   Carbapenem resistance VIM NOT DETECTED NOT DETECTED    Loralee Pacas, PharmD, BCPS Pharmacy: [phone] 06/01/2020  11:58 AM

## 2020-06-01 NOTE — ED Notes (Signed)
Pt resting comfortably with family member at bedside.

## 2020-06-01 NOTE — ED Notes (Signed)
Patient declined her lunch tray. I told her if she changed her mind to please let me know.

## 2020-06-01 NOTE — Progress Notes (Signed)
PROGRESS NOTE    Armina Galloway  MCN:[id number] DOB: 1990/06/26 DOA: 05/31/2020 PCP: Winferd Humphrey   Chief Complaint  Patient presents with  . Abdominal Pain  . Chills    Brief Narrative:  Patient 30 year old female history of recent laparoscopic cholecystectomy with biliary stent placement May 2 with subsequent biliary leak and JP drain placement.  Patient recently hospitalized 5/14-5/18 for ongoing biliary leak status post ERCP with stent exchange (5/17) who had followed up with IR on day of admission for flushing of JP drain with contrast which showed persistent bile leak.  Patient sent home in an hour and a half later started to have uncontrollable chills, nausea, vomiting, lightheadedness presented back to the ED with concern for ongoing sepsis and suspicion for bacteremia secondary to recent JP flushing.  Patient noted on presentation to have a temperature of 101.9, hypotension with blood pressure of 74/60, tachycardic with a pulse of 122 which responded to IV fluids.  Patient also noted to have a leukocytosis with a white count of 15.2.  Urinalysis unremarkable.  CT abdomen and pelvis done showed ongoing bile leak, negative for abscess formation.  Patient admitted and placed empirically on IV antibiotics and pancultured.   Assessment & Plan:   Principal Problem:   Severe sepsis ([place]) Active Problems:   Abdominal pain   Anemia   Leukocytosis   S/P laparoscopic cholecystectomy   Bile leak   Sepsis ([place])   Bacteremia   Vaginal candidiasis  #1 severe sepsis secondary to Enterobacter Cloacae bacteremia -Likely translocated from JP flushing recently. -Patient met criteria for severe sepsis with fever, tachycardia, hypotension, leukocytosis, elevated procalcitonin, lactic acid level of 2.0 on admission.  Now with blood cultures positive for bacteremia -Blood cultures positive for Enterobacter cloacae with sensitivities pending. -Blood pressure seems to have responded to IV  fluids. -Currently afebrile, with improvement with tachycardia. -Patient insistent on being placed on 3 E. -As blood pressure has improved, tachycardia resolved, currently afebrile, with clinical improvement and on appropriate IV antibiotics will place on MedSurg.  With every 4 vitals and close monitoring. -If patient decompensates would not hesitate to transfer to a higher level of care. -Continue empiric IV cefepime. -Follow.  2.  Persistent biliary leak -JP drain in place. -We will need to follow-up with IR.  3.  Status post laparoscopic cholecystectomy -Currently stable. -Outpatient follow-up with general surgery.  4.  Leukocytosis -Likely secondary to problem #1.  5.  Vaginal candidiasis -Patient with complaints to RN with concerns for vaginal candidiasis likely secondary to antibiotics. -Diflucan 150 mg p.o. x1. -Clotrimazole vaginal cream nightly.  6.  Anemia -Patient with no overt GI bleed. -Biliary drain with dark drainage noted. -Follow H&H. -Transfusion threshold hemoglobin < 7.  DVT prophylaxis: Lovenox Code Status: Full Family Communication: Updated patient and husband at bedside Disposition:   Status is: Inpatient    Dispo: The patient is from: Home              Anticipated d/c is to: Home              Patient currently on IV antibiotics, concern for bacteremia, not stable for discharge   Difficult to place patient no       Consultants:   None  Procedures:   CT abdomen and pelvis 05/31/2020  Chest x-ray 05/31/2020  Antimicrobials:   IV cefepime 05/31/2020>>>>  IV Rocephin 05/31/2020>>> 06/01/2020  IV Flagyl 05/31/2020>>>>   Subjective: States had fever of 101.9 this morning. No CP. No  SOIB. Some abd discomfort.  Feeling overall better than when she presented.  Requesting to be placed on 3 E.  Objective: Vitals:   06/01/20 1430 06/01/20 1726 06/01/20 1855 06/01/20 1923  BP: (!) 114/57 (!) 102/46  (!) 91/45  Pulse:  73  71  Resp:    15   Temp: 99.6 F (37.6 C) (!) 103 F (39.4 C) 99.4 F (37.4 C) 99.2 F (37.3 C)  TempSrc: Oral Oral Oral Oral  SpO2: 97% 99%  97%  Weight:      Height:        Intake/Output Summary (Last 24 hours) at 06/01/2020 2127 Last data filed at 06/01/2020 1824 Gross per 24 hour  Intake 5702.22 ml  Output 25 ml  Net 5677.22 ml   Filed Weights   05/31/20 1510 05/31/20 1559  Weight: 102.1 kg 101.6 kg    Examination:  General exam: Appears calm and comfortable  Respiratory system: Clear to auscultation. Respiratory effort normal. Cardiovascular system: S1 & S2 heard, RRR. No JVD, murmurs, rubs, gallops or clicks. No pedal edema. Gastrointestinal system: Abdomen is nondistended, soft and some tenderness to palpation in the right upper quadrant.  Positive bowel sounds.  No rebound.  No guarding.  JP drain with dark bilious drainage.  Central nervous system: Alert and oriented. No focal neurological deficits. Extremities: Symmetric 5 x 5 power. Skin: No rashes, lesions or ulcers Psychiatry: Judgement and insight appear normal. Mood & affect appropriate.     Data Reviewed: I have personally reviewed following labs and imaging studies  CBC: Recent Labs  Lab 05/31/20 1548 05/31/20 2143 06/01/20 0420  WBC 7.8 15.2* 12.7*  NEUTROABS 6.1  --   --   HGB 12.0 10.0* 9.6*  HCT 39.3 32.3* 31.0*  MCV 78.9* 78.6* 77.9*  PLT 430* 347 616    Basic Metabolic Panel: Recent Labs  Lab 05/31/20 1548 05/31/20 2143 06/01/20 0420  NA 137  --  136  K 4.0  --  3.8  CL 100  --  103  CO2 26  --  27  GLUCOSE 98  --  109*  BUN 13  --  9  CREATININE 0.83 0.75 0.76  CALCIUM 9.3  --  8.3*    GFR: Estimated Creatinine Clearance: 125.9 mL/min (by C-G formula based on SCr of 0.76 mg/dL).  Liver Function Tests: Recent Labs  Lab 05/31/20 1548 06/01/20 0420  AST 27 20  ALT 29 23  ALKPHOS 97 71  BILITOT 0.7 1.0  PROT 7.9 6.1*  ALBUMIN 4.2 3.2*    CBG: No results for input(s): GLUCAP in the  last 168 hours.   Recent Results (from the past 240 hour(s))  Culture, blood (routine x 2)     Status: None (Preliminary result)   Collection Time: 05/31/20  3:48 PM   Specimen: BLOOD  Result Value Ref Range Status   Specimen Description   Final    BLOOD RIGHT ANTECUBITAL Performed at [facility] [address], [place], [place] [address]    Special Requests   Final    BOTTLES DRAWN AEROBIC AND ANAEROBIC Blood Culture results may not be optimal due to an inadequate volume of blood received in culture bottles Performed at [facility] [address] [place], [place] [address]    Culture  Setup Time   Final    GRAM NEGATIVE RODS IN BOTH AEROBIC AND ANAEROBIC BOTTLES CRITICAL RESULT CALLED TO, READ BACK BY AND VERIFIED WITH: PHARMD J.LEGGE AT 4854  ON 06/01/2020 BY T.SAAD. Performed at [facility], [place] [address], [place], [place] [address]    Culture GRAM NEGATIVE RODS  Final   Report Status PENDING  Incomplete  Culture, blood (routine x 2)     Status: None (Preliminary result)   Collection Time: 05/31/20  3:48 PM   Specimen: BLOOD  Result Value Ref Range Status   Specimen Description   Final    BLOOD LEFT ANTECUBITAL Performed at [facility] [address], [place], [place] [address]    Special Requests   Final    BOTTLES DRAWN AEROBIC AND ANAEROBIC Blood Culture results may not be optimal due to an inadequate volume of blood received in culture bottles Performed at [facility] [address] [place], [place] [address]    Culture  Setup Time   Final    GRAM NEGATIVE RODS AEROBIC BOTTLE ONLY Performed at [facility], [place] [address] [place], [place] [address]    Culture PENDING  Incomplete   Report Status PENDING  Incomplete  Resp Panel by RT-PCR (Flu A&B, Covid) Nasopharyngeal Swab     Status: None   Collection Time: 05/31/20  3:48 PM   Specimen: Nasopharyngeal Swab;  Nasopharyngeal(NP) swabs in vial transport medium  Result Value Ref Range Status   SARS Coronavirus 2 by RT PCR NEGATIVE NEGATIVE Final    Comment: (NOTE) SARS-CoV-2 target nucleic acids are NOT DETECTED.  The SARS-CoV-2 RNA is generally detectable in upper respiratory specimens during the acute phase of infection. The lowest concentration of SARS-CoV-2 viral copies this assay can detect is 138 copies/mL. A negative result does not preclude SARS-Cov-2 infection and should not be used as the sole basis for treatment or other patient management decisions. A negative result may occur with  improper specimen collection/handling, submission of specimen other than nasopharyngeal swab, presence of viral mutation(s) within the areas targeted by this assay, and inadequate number of viral copies(<138 copies/mL). A negative result must be combined with clinical observations, patient history, and epidemiological information. The expected result is Negative.  Fact Sheet for Patients:  EntrepreneurPulse.com.au  Fact Sheet for Healthcare Providers:  IncredibleEmployment.be  This test is no t yet approved or cleared by the [place] FDA and  has been authorized for detection and/or diagnosis of SARS-CoV-2 by FDA under an Emergency Use Authorization (EUA). This EUA will remain  in effect (meaning this test can be used) for the duration of the COVID-19 declaration under Section 564(b)(1) of the Act, 21 U.S.C.section 360bbb-3(b)(1), unless the authorization is terminated  or revoked sooner.       Influenza A by PCR NEGATIVE NEGATIVE Final   Influenza B by PCR NEGATIVE NEGATIVE Final    Comment: (NOTE) The Xpert Xpress SARS-CoV-2/FLU/RSV plus assay is intended as an aid in the diagnosis of influenza from Nasopharyngeal swab specimens and should not be used as a sole basis for treatment. Nasal washings and aspirates are unacceptable for Xpert Xpress  SARS-CoV-2/FLU/RSV testing.  Fact Sheet for Patients: EntrepreneurPulse.com.au  Fact Sheet for Healthcare Providers: IncredibleEmployment.be  This test is not yet approved or cleared by the [place] FDA and has been authorized for detection and/or diagnosis of SARS-CoV-2 by FDA under an Emergency Use Authorization (EUA). This EUA will remain in effect (meaning this test can be used) for the duration of the COVID-19 declaration under Section 564(b)(1) of the Act, 21 U.S.C. section 360bbb-3(b)(1), unless the authorization is terminated or revoked.  Performed at [facility]  [place] [address], [place], [place] [address]   Blood Culture ID Panel (Reflexed)     Status: Abnormal   Collection Time: 05/31/20  3:48 PM  Result Value Ref Range Status   Enterococcus faecalis NOT DETECTED NOT DETECTED Final   Enterococcus Faecium NOT DETECTED NOT DETECTED Final   Listeria monocytogenes NOT DETECTED NOT DETECTED Final   Staphylococcus species NOT DETECTED NOT DETECTED Final   Staphylococcus aureus (BCID) NOT DETECTED NOT DETECTED Final   Staphylococcus epidermidis NOT DETECTED NOT DETECTED Final   Staphylococcus lugdunensis NOT DETECTED NOT DETECTED Final   Streptococcus species NOT DETECTED NOT DETECTED Final   Streptococcus agalactiae NOT DETECTED NOT DETECTED Final   Streptococcus pneumoniae NOT DETECTED NOT DETECTED Final   Streptococcus pyogenes NOT DETECTED NOT DETECTED Final   A.calcoaceticus-baumannii NOT DETECTED NOT DETECTED Final   Bacteroides fragilis NOT DETECTED NOT DETECTED Final   Enterobacterales DETECTED (A) NOT DETECTED Final    Comment: Enterobacterales represent a large order of gram negative bacteria, not a single organism. CRITICAL RESULT CALLED TO, READ BACK BY AND VERIFIED WITH: PHARMD J.LEGGE AT 1150 ON 06/01/2020 BY T.SAAD.    Enterobacter cloacae complex DETECTED (A) NOT DETECTED Final    Comment:  CRITICAL RESULT CALLED TO, READ BACK BY AND VERIFIED WITH: PHARMD J.LEGGE AT 1150 ON 06/01/2020 BY T.SAAD.    Escherichia coli NOT DETECTED NOT DETECTED Final   Klebsiella aerogenes NOT DETECTED NOT DETECTED Final   Klebsiella oxytoca NOT DETECTED NOT DETECTED Final   Klebsiella pneumoniae NOT DETECTED NOT DETECTED Final   Proteus species NOT DETECTED NOT DETECTED Final   Salmonella species NOT DETECTED NOT DETECTED Final   Serratia marcescens NOT DETECTED NOT DETECTED Final   Haemophilus influenzae NOT DETECTED NOT DETECTED Final   Neisseria meningitidis NOT DETECTED NOT DETECTED Final   Pseudomonas aeruginosa NOT DETECTED NOT DETECTED Final   Stenotrophomonas maltophilia NOT DETECTED NOT DETECTED Final   Candida albicans NOT DETECTED NOT DETECTED Final   Candida auris NOT DETECTED NOT DETECTED Final   Candida glabrata NOT DETECTED NOT DETECTED Final   Candida krusei NOT DETECTED NOT DETECTED Final   Candida parapsilosis NOT DETECTED NOT DETECTED Final   Candida tropicalis NOT DETECTED NOT DETECTED Final   Cryptococcus neoformans/gattii NOT DETECTED NOT DETECTED Final   CTX-M ESBL NOT DETECTED NOT DETECTED Final   Carbapenem resistance IMP NOT DETECTED NOT DETECTED Final   Carbapenem resistance KPC NOT DETECTED NOT DETECTED Final   Carbapenem resistance NDM NOT DETECTED NOT DETECTED Final   Carbapenem resist OXA 48 LIKE NOT DETECTED NOT DETECTED Final   Carbapenem resistance VIM NOT DETECTED NOT DETECTED Final    Comment: Performed at [facility], [address] [place],  [address]         Radiology Studies: CT ABDOMEN PELVIS WO CONTRAST  Result Date: 05/31/2020 CLINICAL DATA:  30 year old female with abdominal pain and fever. EXAM: CT ABDOMEN AND PELVIS WITHOUT CONTRAST TECHNIQUE: Multidetector CT imaging of the abdomen and pelvis was performed following the standard protocol without IV contrast. COMPARISON:  CT abdomen pelvis dated 05/21/2020. FINDINGS:  Evaluation of this exam is limited in the absence of intravenous contrast. Lower chest: The visualized lung bases are clear. No intra-abdominal free air or free fluid. Hepatobiliary: The liver is unremarkable. No intrahepatic biliary dilatation. There is a percutaneous transhepatic pigtail drainage catheter with tip in the region of the gallbladder fossa. No residual fluid identified. Cholecystectomy. A biliary stent is again noted. There  is minimal pneumobilia. No retained calcified stone noted in the central CBD or along the stent. Pancreas: Unremarkable. No pancreatic ductal dilatation or surrounding inflammatory changes. Spleen: Normal in size without focal abnormality. Adrenals/Urinary Tract: The adrenal glands unremarkable. There is no hydronephrosis on either side. Excreted contrast noted in the renal collecting system and urinary bladder. Stomach/Bowel: There is no bowel obstruction or active inflammation. The appendix is normal. Vascular/Lymphatic: The abdominal aorta and IVC are grossly unremarkable on this noncontrast CT. No portal venous gas. There is no adenopathy. Reproductive: The uterus is anteverted and grossly unremarkable. There is a 3 cm left ovarian dominant follicle corpus luteum. The right ovary is unremarkable. Other: None Musculoskeletal: No acute or significant osseous findings. IMPRESSION: 1. No acute intra-abdominal or pelvic pathology. No change in position of the subhepatic drainage catheter or CBD stent compared to prior CT. No drainable fluid collection or abscess. 2. No bowel obstruction. Normal appendix. Electronically Signed   By: Anner Crete M.D.   On: 05/31/2020 17:02   CT ABDOMEN PELVIS W CONTRAST  Result Date: 05/31/2020 CLINICAL DATA:  30 year old female with a history of bile leak following laparoscopic cholecystectomy. A percutaneous drainage catheter was placed in the gallbladder fossa on 05/14/2020. Patient continues to have relatively high volume bilious output  despite an endoscopically placed biliary stent. EXAM: CT ABDOMEN AND PELVIS WITH CONTRAST TECHNIQUE: Multidetector CT imaging of the abdomen and pelvis was performed using the standard protocol following bolus administration of intravenous contrast. CONTRAST:  173m ISOVUE-300 IOPAMIDOL (ISOVUE-300) INJECTION 61% COMPARISON:  Prior CT scan 05/21/2020 FINDINGS: Lower chest: No acute abnormality. Hepatobiliary: No biliary ductal dilatation. Percutaneous drainage catheter remains in good position. No evidence of undrained fluid collection. Small amount of pneumobilia not unexpected given the presence of the plastic biliary stent. Pancreas: Unremarkable. No pancreatic ductal dilatation or surrounding inflammatory changes. Spleen: Normal in size without focal abnormality. Adrenals/Urinary Tract: Normal adrenal glands. Bilateral nephrolithiasis again visualized without interval change. Unremarkable ureters and bladder. Stomach/Bowel: Stomach is within normal limits. Appendix appears normal. No evidence of bowel wall thickening, distention, or inflammatory changes. Vascular/Lymphatic: No significant vascular findings are present. No enlarged abdominal or pelvic lymph nodes. Reproductive: Uterus and bilateral adnexa are unremarkable. Other: No abdominal wall hernia or abnormality. No abdominopelvic ascites. Musculoskeletal: No acute or significant osseous findings. IMPRESSION: 1. Well-positioned percutaneous drainage catheter in the gallbladder fossa without evidence of residual or undrained fluid. 2. Well-positioned plastic biliary stent. Electronically Signed   By: HJacqulynn CadetM.D.   On: 05/31/2020 13:21   DG Sinus/Fist Tube Chk-Non GI  Result Date: 05/31/2020 INDICATION: 30year old female with persistent bilious output from a percutaneous drainage catheter in the gallbladder fossa despite resolution of biloma on CT imaging and a well-positioned plastic biliary stent. EXAM: Drain injection MEDICATIONS: None.  ANESTHESIA/SEDATION: None. COMPLICATIONS: None immediate. PROCEDURE: A gentle hand injection of contrast material was performed under fluoroscopic imaging. The contrast opacifies the collapsed biloma cavity in the gallbladder fossa. There is a small fistulous communication extending into the hepatic parenchyma with parenchymal opacification as well as intravasation into both the portal venous and hepatic venous systems. No evidence of direct communication with the cystic duct remanent or biliary tree. IMPRESSION: 1. Resolved biloma. 2. 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 This likely represents the source of bilious weeping. There is no direct fistulous communication with the cystic duct  or biliary tree itself. Electronically Signed   By: Jacqulynn Cadet M.D.   On: 05/31/2020 13:27   DG Chest Port 1 View  Result Date: 05/31/2020 CLINICAL DATA:  Fever and chills following recent contrast injection in biloma drain EXAM: PORTABLE CHEST 1 VIEW COMPARISON:  05/22/2020 FINDINGS: Cardiac shadow is within normal limits. Overall inspiratory effort is poor. No focal infiltrate or effusion is seen. No bony abnormality is noted. IMPRESSION: No acute abnormality noted. Electronically Signed   By: Inez Catalina M.D.   On: 05/31/2020 16:21   IR Radiologist Eval & Mgmt  Result Date: 05/31/2020 Please refer to notes tab for details about interventional procedure. (Op Note)       Scheduled Meds: . clotrimazole  1 Applicatorful Vaginal QHS  . enoxaparin (LOVENOX) injection  50 mg Subcutaneous Q24H  . saccharomyces boulardii  250 mg Oral BID  . scopolamine  1 patch Transdermal Q72H   Continuous Infusions: . sodium chloride    . ceFEPime (MAXIPIME) IV 2 g (06/01/20 1635)  . lactated ringers 125 mL/hr at 06/01/20 1748  . metronidazole 500  mg (06/01/20 1526)  . sodium chloride       LOS: 1 day    Time spent: 40 minutes    Irine Seal, MD [facility]   To contact the attending provider between 7A-7P or the covering provider during after hours 7P-7A, please log into the web site [email or web address] and access using universal [place] password for that web site. If you do not have the password, please call the hospital operator.  06/01/2020, 9:27 PM

## 2020-06-02 DIAGNOSIS — K8309 Other cholangitis: Secondary | ICD-10-CM | POA: Diagnosis not present

## 2020-06-02 DIAGNOSIS — R6520 Severe sepsis without septic shock: Secondary | ICD-10-CM | POA: Diagnosis not present

## 2020-06-02 DIAGNOSIS — B9689 Other specified bacterial agents as the cause of diseases classified elsewhere: Secondary | ICD-10-CM

## 2020-06-02 DIAGNOSIS — A419 Sepsis, unspecified organism: Secondary | ICD-10-CM | POA: Diagnosis not present

## 2020-06-02 DIAGNOSIS — Z9049 Acquired absence of other specified parts of digestive tract: Secondary | ICD-10-CM | POA: Diagnosis not present

## 2020-06-02 DIAGNOSIS — D649 Anemia, unspecified: Secondary | ICD-10-CM | POA: Diagnosis not present

## 2020-06-02 DIAGNOSIS — K839 Disease of biliary tract, unspecified: Secondary | ICD-10-CM

## 2020-06-02 DIAGNOSIS — R7881 Bacteremia: Secondary | ICD-10-CM

## 2020-06-02 DIAGNOSIS — D72829 Elevated white blood cell count, unspecified: Secondary | ICD-10-CM | POA: Diagnosis not present

## 2020-06-02 LAB — URINE CULTURE

## 2020-06-02 LAB — CBC WITH DIFFERENTIAL/PLATELET
Abs Immature Granulocytes: 0.02 10*3/uL (ref 0.00–0.07)
Basophils Absolute: 0 10*3/uL (ref 0.0–0.1)
Basophils Relative: 0 %
Eosinophils Absolute: 0.1 10*3/uL (ref 0.0–0.5)
Eosinophils Relative: 2 %
HCT: 28.7 % — ABNORMAL LOW (ref 36.0–46.0)
Hemoglobin: 8.6 g/dL — ABNORMAL LOW (ref 12.0–15.0)
Immature Granulocytes: 0 %
Lymphocytes Relative: 37 %
Lymphs Abs: 2.4 10*3/uL (ref 0.7–4.0)
MCH: 24.3 pg — ABNORMAL LOW (ref 26.0–34.0)
MCHC: 30 g/dL (ref 30.0–36.0)
MCV: 81.1 fL (ref 80.0–100.0)
Monocytes Absolute: 0.5 10*3/uL (ref 0.1–1.0)
Monocytes Relative: 8 %
Neutro Abs: 3.4 10*3/uL (ref 1.7–7.7)
Neutrophils Relative %: 53 %
Platelets: 252 10*3/uL (ref 150–400)
RBC: 3.54 MIL/uL — ABNORMAL LOW (ref 3.87–5.11)
RDW: 18.4 % — ABNORMAL HIGH (ref 11.5–15.5)
WBC: 6.5 10*3/uL (ref 4.0–10.5)
nRBC: 0 % (ref 0.0–0.2)

## 2020-06-02 LAB — COMPREHENSIVE METABOLIC PANEL
ALT: 16 U/L (ref 0–44)
AST: 27 U/L (ref 15–41)
Albumin: 2.5 g/dL — ABNORMAL LOW (ref 3.5–5.0)
Alkaline Phosphatase: 54 U/L (ref 38–126)
Anion gap: 4 — ABNORMAL LOW (ref 5–15)
BUN: 6 mg/dL (ref 6–20)
CO2: 23 mmol/L (ref 22–32)
Calcium: 7.4 mg/dL — ABNORMAL LOW (ref 8.9–10.3)
Chloride: 113 mmol/L — ABNORMAL HIGH (ref 98–111)
Creatinine, Ser: 0.66 mg/dL (ref 0.44–1.00)
GFR, Estimated: >60 mL/min (ref >60)
Glucose, Bld: 95 mg/dL (ref 70–99)
Potassium: 4.8 mmol/L (ref 3.5–5.1)
Sodium: 140 mmol/L (ref 135–145)
Total Bilirubin: 0.9 mg/dL (ref 0.3–1.2)
Total Protein: 5.3 g/dL — ABNORMAL LOW (ref 6.5–8.1)

## 2020-06-02 LAB — MRSA PCR SCREENING: MRSA by PCR: NEGATIVE

## 2020-06-02 LAB — LACTIC ACID, PLASMA: Lactic Acid, Venous: 1 mmol/L (ref 0.5–1.9)

## 2020-06-02 LAB — PROCALCITONIN: Procalcitonin: 25.33 ng/mL

## 2020-06-02 MED ORDER — FENTANYL CITRATE (PF) 100 MCG/2ML IJ SOLN: 25.0000 ug | INTRAMUSCULAR | Status: DC | PRN

## 2020-06-02 MED ORDER — PANTOPRAZOLE SODIUM 40 MG IV SOLR
40.0000 mg | INTRAVENOUS | Status: DC
  Administered 2020-06-02 – 2020-06-04 (×3): 40 mg via INTRAVENOUS
  Filled 2020-06-02 (×3): qty 40

## 2020-06-02 MED ORDER — VANCOMYCIN HCL 2000 MG/400ML IV SOLN
2000.0000 mg | INTRAVENOUS | Status: AC
  Administered 2020-06-02: 2000 mg via INTRAVENOUS
  Filled 2020-06-02: qty 400

## 2020-06-02 MED ORDER — VANCOMYCIN HCL 1250 MG/250ML IV SOLN
1250.0000 mg | Freq: Two times a day (BID) | INTRAVENOUS | Status: DC
  Filled 2020-06-02: qty 250

## 2020-06-02 MED ORDER — FENTANYL CITRATE (PF) 100 MCG/2ML IJ SOLN: 50.0000 ug | INTRAMUSCULAR | Status: DC | PRN

## 2020-06-02 MED ORDER — SODIUM CHLORIDE 0.9 % IV BOLUS
1000.0000 mL | Freq: Once | INTRAVENOUS | Status: AC
  Administered 2020-06-02: 1000 mL via INTRAVENOUS

## 2020-06-02 MED ORDER — CHLORHEXIDINE GLUCONATE CLOTH 2 % EX PADS
6.0000 | MEDICATED_PAD | Freq: Every day | CUTANEOUS | Status: DC
  Administered 2020-06-02: 6 via TOPICAL

## 2020-06-02 MED ORDER — NOREPINEPHRINE 4 MG/250ML-% IV SOLN
0.0000 ug/min | INTRAVENOUS | Status: DC
  Filled 2020-06-02: qty 250

## 2020-06-02 MED ORDER — METOCLOPRAMIDE HCL 10 MG PO TABS
10.0000 mg | ORAL_TABLET | Freq: Three times a day (TID) | ORAL | Status: DC
  Administered 2020-06-02 – 2020-06-04 (×6): 10 mg via ORAL
  Filled 2020-06-02: qty 2
  Filled 2020-06-02 (×3): qty 1
  Filled 2020-06-02 (×2): qty 2

## 2020-06-02 NOTE — Progress Notes (Signed)
PROGRESS NOTE    Jordan Chung  YYQ:[id number] DOB: 1990/12/27 DOA: 05/31/2020 PCP: Winferd Humphrey   Chief Complaint  Patient presents with  . Abdominal Pain  . Chills    Brief Narrative:  Patient 30 year old female history of recent laparoscopic cholecystectomy with biliary stent placement May 2 with subsequent biliary leak and JP drain placement.  Patient recently hospitalized 5/14-5/18 for ongoing biliary leak status post ERCP with stent exchange (5/17) who had followed up with IR on day of admission for flushing of JP drain with contrast which showed persistent bile leak.  Patient sent home in an hour and a half later started to have uncontrollable chills, nausea, vomiting, lightheadedness presented back to the ED with concern for ongoing sepsis and suspicion for bacteremia secondary to recent JP flushing.  Patient noted on presentation to have a temperature of 101.9, hypotension with blood pressure of 74/60, tachycardic with a pulse of 122 which responded to IV fluids.  Patient also noted to have a leukocytosis with a white count of 15.2.  Urinalysis unremarkable.  CT abdomen and pelvis done showed ongoing bile leak, negative for abscess formation.  Patient admitted and placed empirically on IV antibiotics and pancultured.   Assessment & Plan:   Principal Problem:   Severe sepsis ([place]) Active Problems:   Abdominal pain   Anemia   Leukocytosis   S/P laparoscopic cholecystectomy   Bile leak   Sepsis ([place])   Bacteremia   Vaginal candidiasis  1 severe sepsis secondary to Enterobacter Cloacae bacteremia -Likely translocated from JP flushing recently. -Patient met criteria for severe sepsis with fever, tachycardia, hypotension, leukocytosis, elevated procalcitonin, lactic acid level of 2.0 on admission.  Now with blood cultures positive for bacteremia -Blood cultures positive for Enterobacter cloacae with sensitivities pending. -Patient noted to have some persistent  hypotension overnight with systolic blood pressures in the 90s despite some fluid boluses and subsequently transferred to the stepdown unit and PCCM consultation obtained.  -Blood pressure responded to fluid boluses and patient did not require any pressors.  -Systolic blood pressures this morning greater than 120s.   -Fever curve trended down.   -IV Flagyl discontinued.   -Continue IV cefepime.   -Due to patient's recent surgical history, ongoing bile leak, fistula connection with JP drain, status post recent cholecystectomy will consult with ID for antibiotic recommendations and duration.   -Pain management.   -Supportive care.    2.  Persistent biliary leak -JP drain in place with output of 33 cc over the past 24 hours.. -Due to concern for fistulous communication between tube and adjacent liver parenchyma IR recommending no further flushing at this time to facilitate healing. -Informed IR of patient's admission. -Outpatient follow-up with IR.   3.  Status post laparoscopic cholecystectomy -Currently stable. -Outpatient follow-up with general surgery.  4.  Leukocytosis -Likely secondary to problem #1.  Leukocytosis trending down.  5.  Vaginal candidiasis -Diflucan 150 mg p.o. x1.   -Clotrimazole cream nightly.   6.  Anemia -Patient with no overt GI bleed. -Hemoglobin currently at 8.6, likely dilutional effect.   -Patient with no overt bleeding.   -Transfusion threshold hemoglobin < 7.  -Follow H&H.    DVT prophylaxis: Lovenox Code Status: Full Family Communication: Updated patient and father at bedside Disposition:   Status is: Inpatient    Dispo: The patient is from: Home              Anticipated d/c is to: Home  Patient currently on IV antibiotics, with bacteremia, ID consultation pending, not stable for discharge.    Difficult to place patient no       Consultants:   PCCM: Dr. Earlie Server 06/02/2020  Procedures:   CT abdomen and pelvis  05/31/2020  Chest x-ray 05/31/2020  Antimicrobials:   IV cefepime 05/31/2020>>>>  IV Rocephin 05/31/2020>>> 06/01/2020  IV Flagyl 05/31/2020>>>> 06/02/2020   Subjective: Events overnight noted.  Patient transferred to stepdown unit due to persistent systolic blood pressures in the 90s despite fluid boluses and PCCM consulted.   Patient sitting up in bed feels better.  Blood pressure improved on fluids.  Denies any chest pain.  No shortness of breath.  Abdominal pain improving.  T-max of 103 at 1726 hrs. yesterday.  Fever curve trended down. Asking to be placed back on 3 E.  Objective: Vitals:   06/02/20 0700 06/02/20 0800 06/02/20 1000 06/02/20 1100  BP: (!) 116/50 (!) 130/54    Pulse: 61 69 (!) 57 (!) 59  Resp: '16 17 16 ' (!) 22  Temp:  97.9 F (36.6 C)    TempSrc:  Oral    SpO2: 95% 98% 99% 100%  Weight:      Height:        Intake/Output Summary (Last 24 hours) at 06/02/2020 1146 Last data filed at 06/02/2020 0600 Gross per 24 hour  Intake 3896.98 ml  Output 1433 ml  Net 2463.98 ml   Filed Weights   05/31/20 1510 05/31/20 1559  Weight: 102.1 kg 101.6 kg    Examination:  General exam: NAD Respiratory system: Lungs clear to auscultation bilaterally.  No wheezes, no crackles, no rhonchi.  Normal respiratory effort.  Speaking in full sentences.  Cardiovascular system: Regular rate rhythm no murmurs rubs or gallops.  No JVD.  No lower extremity edema.  Gastrointestinal system: Abdomen is soft, nondistended, decreased tenderness to palpation right upper quadrant.  Positive bowel sounds.  No rebound.  No guarding.  JP drain with bilious drainage. Central nervous system: Alert and oriented.  No focal neurological deficits.  Extremities: Symmetric 5 x 5 power. Skin: No rashes, lesions or ulcers Psychiatry: Judgement and insight appear normal. Mood & affect appropriate.     Data Reviewed: I have personally reviewed following labs and imaging studies  CBC: Recent Labs  Lab  05/31/20 1548 05/31/20 2143 06/01/20 0420 06/02/20 0150  WBC 7.8 15.2* 12.7* 6.5  NEUTROABS 6.1  --   --  3.4  HGB 12.0 10.0* 9.6* 8.6*  HCT 39.3 32.3* 31.0* 28.7*  MCV 78.9* 78.6* 77.9* 81.1  PLT 430* 347 296 308    Basic Metabolic Panel: Recent Labs  Lab 05/31/20 1548 05/31/20 2143 06/01/20 0420 06/02/20 0150  NA 137  --  136 140  K 4.0  --  3.8 4.8  CL 100  --  103 113*  CO2 26  --  27 23  GLUCOSE 98  --  109* 95  BUN 13  --  9 6  CREATININE 0.83 0.75 0.76 0.66  CALCIUM 9.3  --  8.3* 7.4*    GFR: Estimated Creatinine Clearance: 125.9 mL/min (by C-G formula based on SCr of 0.66 mg/dL).  Liver Function Tests: Recent Labs  Lab 05/31/20 1548 06/01/20 0420 06/02/20 0150  AST '27 20 27  ' ALT '29 23 16  ' ALKPHOS 97 71 54  BILITOT 0.7 1.0 0.9  PROT 7.9 6.1* 5.3*  ALBUMIN 4.2 3.2* 2.5*    CBG: No results for input(s): GLUCAP in the last 168  hours.   Recent Results (from the past 240 hour(s))  Culture, blood (routine x 2)     Status: Abnormal (Preliminary result)   Collection Time: 05/31/20  3:48 PM   Specimen: BLOOD  Result Value Ref Range Status   Specimen Description   Final    BLOOD RIGHT ANTECUBITAL Performed at [facility] [address], [place], [place] [address]    Special Requests   Final    BOTTLES DRAWN AEROBIC AND ANAEROBIC Blood Culture results may not be optimal due to an inadequate volume of blood received in culture bottles Performed at [facility] [address], [place], [place] [address]    Culture  Setup Time   Final    GRAM NEGATIVE RODS IN BOTH AEROBIC AND ANAEROBIC BOTTLES CRITICAL RESULT CALLED TO, READ BACK BY AND VERIFIED WITH: PHARMD J.LEGGE AT 3009 ON 06/01/2020 BY T.SAAD.    Culture (A)  Final    ENTEROBACTER CLOACAE SUSCEPTIBILITIES TO FOLLOW Performed at [facility], [place] [address] [place], [place] [address]    Report Status PENDING  Incomplete  Culture, blood (routine x  2)     Status: None (Preliminary result)   Collection Time: 05/31/20  3:48 PM   Specimen: BLOOD  Result Value Ref Range Status   Specimen Description   Final    BLOOD LEFT ANTECUBITAL Performed at [facility] [address], [place], [place] [address]    Special Requests   Final    BOTTLES DRAWN AEROBIC AND ANAEROBIC Blood Culture results may not be optimal due to an inadequate volume of blood received in culture bottles Performed at [facility] [address], [place], [place] [address]    Culture  Setup Time   Final    GRAM NEGATIVE RODS AEROBIC BOTTLE ONLY CRITICAL VALUE NOTED.  VALUE IS CONSISTENT WITH PREVIOUSLY REPORTED AND CALLED VALUE.    Culture   Final    GRAM NEGATIVE RODS IDENTIFICATION TO FOLLOW Performed at [facility], [place] [address] [place], [place] [address]    Report Status PENDING  Incomplete  Resp Panel by RT-PCR (Flu A&B, Covid) Nasopharyngeal Swab     Status: None   Collection Time: 05/31/20  3:48 PM   Specimen: Nasopharyngeal Swab; Nasopharyngeal(NP) swabs in vial transport medium  Result Value Ref Range Status   SARS Coronavirus 2 by RT PCR NEGATIVE NEGATIVE Final    Comment: (NOTE) SARS-CoV-2 target nucleic acids are NOT DETECTED.  The SARS-CoV-2 RNA is generally detectable in upper respiratory specimens during the acute phase of infection. The lowest concentration of SARS-CoV-2 viral copies this assay can detect is 138 copies/mL. A negative result does not preclude SARS-Cov-2 infection and should not be used as the sole basis for treatment or other patient management decisions. A negative result may occur with  improper specimen collection/handling, submission of specimen other than nasopharyngeal swab, presence of viral mutation(s) within the areas targeted by this assay, and inadequate number of viral copies(<138 copies/mL). A negative result must be combined with clinical observations, patient  history, and epidemiological information. The expected result is Negative.  Fact Sheet for Patients:  EntrepreneurPulse.com.au  Fact Sheet for Healthcare Providers:  IncredibleEmployment.be  This test is no t yet approved or cleared by the [place] FDA and  has been authorized for detection and/or diagnosis of SARS-CoV-2 by FDA under an Emergency Use Authorization (EUA). This EUA will remain  in effect (meaning this test can  be used) for the duration of the COVID-19 declaration under Section 564(b)(1) of the Act, 21 U.S.C.section 360bbb-3(b)(1), unless the authorization is terminated  or revoked sooner.       Influenza A by PCR NEGATIVE NEGATIVE Final   Influenza B by PCR NEGATIVE NEGATIVE Final    Comment: (NOTE) The Xpert Xpress SARS-CoV-2/FLU/RSV plus assay is intended as an aid in the diagnosis of influenza from Nasopharyngeal swab specimens and should not be used as a sole basis for treatment. Nasal washings and aspirates are unacceptable for Xpert Xpress SARS-CoV-2/FLU/RSV testing.  Fact Sheet for Patients: EntrepreneurPulse.com.au  Fact Sheet for Healthcare Providers: IncredibleEmployment.be  This test is not yet approved or cleared by the [place] FDA and has been authorized for detection and/or diagnosis of SARS-CoV-2 by FDA under an Emergency Use Authorization (EUA). This EUA will remain in effect (meaning this test can be used) for the duration of the COVID-19 declaration under Section 564(b)(1) of the Act, 21 U.S.C. section 360bbb-3(b)(1), unless the authorization is terminated or revoked.  Performed at [facility], [place] [address] [place], [place] [address]   Blood Culture ID Panel (Reflexed)     Status: Abnormal   Collection Time: 05/31/20  3:48 PM  Result Value Ref Range Status   Enterococcus faecalis NOT DETECTED NOT DETECTED Final   Enterococcus Faecium  NOT DETECTED NOT DETECTED Final   Listeria monocytogenes NOT DETECTED NOT DETECTED Final   Staphylococcus species NOT DETECTED NOT DETECTED Final   Staphylococcus aureus (BCID) NOT DETECTED NOT DETECTED Final   Staphylococcus epidermidis NOT DETECTED NOT DETECTED Final   Staphylococcus lugdunensis NOT DETECTED NOT DETECTED Final   Streptococcus species NOT DETECTED NOT DETECTED Final   Streptococcus agalactiae NOT DETECTED NOT DETECTED Final   Streptococcus pneumoniae NOT DETECTED NOT DETECTED Final   Streptococcus pyogenes NOT DETECTED NOT DETECTED Final   A.calcoaceticus-baumannii NOT DETECTED NOT DETECTED Final   Bacteroides fragilis NOT DETECTED NOT DETECTED Final   Enterobacterales DETECTED (A) NOT DETECTED Final    Comment: Enterobacterales represent a large order of gram negative bacteria, not a single organism. CRITICAL RESULT CALLED TO, READ BACK BY AND VERIFIED WITH: PHARMD J.LEGGE AT 1150 ON 06/01/2020 BY T.SAAD.    Enterobacter cloacae complex DETECTED (A) NOT DETECTED Final    Comment: CRITICAL RESULT CALLED TO, READ BACK BY AND VERIFIED WITH: PHARMD J.LEGGE AT 1150 ON 06/01/2020 BY T.SAAD.    Escherichia coli NOT DETECTED NOT DETECTED Final   Klebsiella aerogenes NOT DETECTED NOT DETECTED Final   Klebsiella oxytoca NOT DETECTED NOT DETECTED Final   Klebsiella pneumoniae NOT DETECTED NOT DETECTED Final   Proteus species NOT DETECTED NOT DETECTED Final   Salmonella species NOT DETECTED NOT DETECTED Final   Serratia marcescens NOT DETECTED NOT DETECTED Final   Haemophilus influenzae NOT DETECTED NOT DETECTED Final   Neisseria meningitidis NOT DETECTED NOT DETECTED Final   Pseudomonas aeruginosa NOT DETECTED NOT DETECTED Final   Stenotrophomonas maltophilia NOT DETECTED NOT DETECTED Final   Candida albicans NOT DETECTED NOT DETECTED Final   Candida auris NOT DETECTED NOT DETECTED Final   Candida glabrata NOT DETECTED NOT DETECTED Final   Candida krusei NOT DETECTED NOT  DETECTED Final   Candida parapsilosis NOT DETECTED NOT DETECTED Final   Candida tropicalis NOT DETECTED NOT DETECTED Final   Cryptococcus neoformans/gattii NOT DETECTED NOT DETECTED Final   CTX-M ESBL NOT DETECTED NOT DETECTED Final   Carbapenem resistance IMP NOT DETECTED NOT DETECTED Final   Carbapenem resistance KPC  NOT DETECTED NOT DETECTED Final   Carbapenem resistance NDM NOT DETECTED NOT DETECTED Final   Carbapenem resist OXA 48 LIKE NOT DETECTED NOT DETECTED Final   Carbapenem resistance VIM NOT DETECTED NOT DETECTED Final    Comment: Performed at [facility], [place] [address] [place], [place] [address]  Urine culture     Status: Abnormal   Collection Time: 05/31/20  5:47 PM   Specimen: Urine, Clean Catch  Result Value Ref Range Status   Specimen Description   Final    URINE, CLEAN CATCH Performed at [facility], [place] [address], [place], [place] [address]    Special Requests   Final    NONE Performed at [facility], [place] [address] [place], [place] [address]    Culture MULTIPLE SPECIES PRESENT, SUGGEST RECOLLECTION (A)  Final   Report Status 06/02/2020 FINAL  Final  MRSA PCR Screening     Status: None   Collection Time: 06/02/20  1:13 AM   Specimen: Nasopharyngeal  Result Value Ref Range Status   MRSA by PCR NEGATIVE NEGATIVE Final    Comment:        The GeneXpert MRSA Assay (FDA approved for NASAL specimens only), is one component of a comprehensive MRSA colonization surveillance program. It is not intended to diagnose MRSA infection nor to guide or monitor treatment for MRSA infections. Performed at [facility], [place] [address] [place], [place] [address]          Radiology Studies: CT ABDOMEN PELVIS WO CONTRAST  Result Date: 05/31/2020 CLINICAL DATA:  30 year old female with abdominal pain and fever. EXAM: CT ABDOMEN AND PELVIS WITHOUT CONTRAST TECHNIQUE: Multidetector CT imaging of the  abdomen and pelvis was performed following the standard protocol without IV contrast. COMPARISON:  CT abdomen pelvis dated 05/21/2020. FINDINGS: Evaluation of this exam is limited in the absence of intravenous contrast. Lower chest: The visualized lung bases are clear. No intra-abdominal free air or free fluid. Hepatobiliary: The liver is unremarkable. No intrahepatic biliary dilatation. There is a percutaneous transhepatic pigtail drainage catheter with tip in the region of the gallbladder fossa. No residual fluid identified. Cholecystectomy. A biliary stent is again noted. There is minimal pneumobilia. No retained calcified stone noted in the central CBD or along the stent. Pancreas: Unremarkable. No pancreatic ductal dilatation or surrounding inflammatory changes. Spleen: Normal in size without focal abnormality. Adrenals/Urinary Tract: The adrenal glands unremarkable. There is no hydronephrosis on either side. Excreted contrast noted in the renal collecting system and urinary bladder. Stomach/Bowel: There is no bowel obstruction or active inflammation. The appendix is normal. Vascular/Lymphatic: The abdominal aorta and IVC are grossly unremarkable on this noncontrast CT. No portal venous gas. There is no adenopathy. Reproductive: The uterus is anteverted and grossly unremarkable. There is a 3 cm left ovarian dominant follicle corpus luteum. The right ovary is unremarkable. Other: None Musculoskeletal: No acute or significant osseous findings. IMPRESSION: 1. No acute intra-abdominal or pelvic pathology. No change in position of the subhepatic drainage catheter or CBD stent compared to prior CT. No drainable fluid collection or abscess. 2. No bowel obstruction. Normal appendix. Electronically Signed   By: Anner Crete M.D.   On: 05/31/2020 17:02   CT ABDOMEN PELVIS W CONTRAST  Result Date: 05/31/2020 CLINICAL DATA:  30 year old female with a history of bile leak following laparoscopic cholecystectomy. A  percutaneous drainage catheter was placed in the gallbladder fossa on 05/14/2020. Patient continues to have relatively high volume bilious  output despite an endoscopically placed biliary stent. EXAM: CT ABDOMEN AND PELVIS WITH CONTRAST TECHNIQUE: Multidetector CT imaging of the abdomen and pelvis was performed using the standard protocol following bolus administration of intravenous contrast. CONTRAST:  163m ISOVUE-300 IOPAMIDOL (ISOVUE-300) INJECTION 61% COMPARISON:  Prior CT scan 05/21/2020 FINDINGS: Lower chest: No acute abnormality. Hepatobiliary: No biliary ductal dilatation. Percutaneous drainage catheter remains in good position. No evidence of undrained fluid collection. Small amount of pneumobilia not unexpected given the presence of the plastic biliary stent. Pancreas: Unremarkable. No pancreatic ductal dilatation or surrounding inflammatory changes. Spleen: Normal in size without focal abnormality. Adrenals/Urinary Tract: Normal adrenal glands. Bilateral nephrolithiasis again visualized without interval change. Unremarkable ureters and bladder. Stomach/Bowel: Stomach is within normal limits. Appendix appears normal. No evidence of bowel wall thickening, distention, or inflammatory changes. Vascular/Lymphatic: No significant vascular findings are present. No enlarged abdominal or pelvic lymph nodes. Reproductive: Uterus and bilateral adnexa are unremarkable. Other: No abdominal wall hernia or abnormality. No abdominopelvic ascites. Musculoskeletal: No acute or significant osseous findings. IMPRESSION: 1. Well-positioned percutaneous drainage catheter in the gallbladder fossa without evidence of residual or undrained fluid. 2. Well-positioned plastic biliary stent. Electronically Signed   By: HJacqulynn CadetM.D.   On: 05/31/2020 13:21   DG Sinus/Fist Tube Chk-Non GI  Result Date: 05/31/2020 INDICATION: 30year old female with persistent bilious output from a percutaneous drainage catheter in the  gallbladder fossa despite resolution of biloma on CT imaging and a well-positioned plastic biliary stent. EXAM: Drain injection MEDICATIONS: None. ANESTHESIA/SEDATION: None. COMPLICATIONS: None immediate. PROCEDURE: A gentle hand injection of contrast material was performed under fluoroscopic imaging. The contrast opacifies the collapsed biloma cavity in the gallbladder fossa. There is a small fistulous communication extending into the hepatic parenchyma with parenchymal opacification as well as intravasation into both the portal venous and hepatic venous systems. No evidence of direct communication with the cystic duct remanent or biliary tree. IMPRESSION: 1. Resolved biloma. 2. 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 This likely represents the source of bilious weeping. There is no direct fistulous communication with the cystic duct or biliary tree itself. Electronically Signed   By: HJacqulynn CadetM.D.   On: 05/31/2020 13:27   DG Chest Port 1 View  Result Date: 05/31/2020 CLINICAL DATA:  Fever and chills following recent contrast injection in biloma drain EXAM: PORTABLE CHEST 1 VIEW COMPARISON:  05/22/2020 FINDINGS: Cardiac shadow is within normal limits. Overall inspiratory effort is poor. No focal infiltrate or effusion is seen. No bony abnormality is noted. IMPRESSION: No acute abnormality noted. Electronically Signed   By: MInez CatalinaM.D.   On: 05/31/2020 16:21   IR Radiologist Eval & Mgmt  Result Date: 05/31/2020 Please refer to notes tab for details about interventional procedure. (Op Note)       Scheduled Meds: . Chlorhexidine Gluconate Cloth  6 each Topical Daily  . clotrimazole  1 Applicatorful Vaginal QHS  . enoxaparin (LOVENOX) injection  50 mg Subcutaneous Q24H  . pantoprazole (PROTONIX) IV  40 mg  Intravenous Q24H  . saccharomyces boulardii  250 mg Oral BID   Continuous Infusions: . ceFEPime (MAXIPIME) IV Stopped (06/02/20 0531)  . lactated ringers 125 mL/hr at 06/02/20 1056     LOS: 2 days    Time spent: 45 minutes    DIrine Seal MD [facility]   To contact the attending provider  between 7A-7P or the covering provider during after hours 7P-7A, please log into the web site [email or web address] and access using universal [place] password for that web site. If you do not have the password, please call the hospital operator.  06/02/2020, 11:46 AM

## 2020-06-02 NOTE — Progress Notes (Signed)
Pharmacy Antibiotic Note  Jordan Chung is a 30 y.o. female admitted on 05/31/2020 with sepsis.  PMH significant for lap cholecystectomy with biliary stent placement (05/09/20), postpartum 8 weeks.  On admission patient treated with ceftriaxone and metronidazole.  On 5/25 ceftriaxone changed to cefepime based on +BCID.  Tonight, patient transferred to ICU due to low blood pressure.  Vancomycin empirically added while cultures are pending and Pharmacy has been consulted for Vancomycin dosing.  Plan: Vancomycin 2gm IV x 1 loading dose followed by Vancomycin 1250 mg IV Q 12 hrs. Goal AUC 400-550.  Expected AUC: 553  SCr used: 0.8 (rounded up from 0.76)   Cefepime 2gm IV q8h  Metronidazole 500mg  IV q8h  Height: 5\' 8"  (172.7 cm) Weight: 101.6 kg (224 lb) IBW/kg (Calculated) : 63.9  Temp (24hrs), Avg:99.1 F (37.3 C), Min:97.6 F (36.4 C), Max:103 F (39.4 C)  Recent Labs  Lab 05/31/20 1548 05/31/20 1823 05/31/20 2143 06/01/20 0420 06/02/20 0150  WBC 7.8  --  15.2* 12.7* 6.5  CREATININE 0.83  --  0.75 0.76  --   LATICACIDVEN 2.0* 1.5  --   --   --     Estimated Creatinine Clearance: 125.9 mL/min (by C-G formula based on SCr of 0.76 mg/dL).    No Known Allergies  Antimicrobials this admission: 5/24 Ceftriaxone >> 5/25 5/24 Metronidazole >>   5/25 Cefepime >> 5/26 Vancomycin >>  Dose adjustments this admission:    Microbiology results: 5/24 BCx: Enterobacter cloacea 5/26 MRSA PCR:    Thank you for allowing pharmacy to be a part of this patient's care.  6/24, PharmD 06/02/2020 2:22 AM

## 2020-06-02 NOTE — Consult Note (Signed)
[facility]       Reason for Consult: bacteremia    Referring Physician: Dr. Janee Morn  Principal Problem:   Severe sepsis [facility]) Active Problems:   Abdominal pain   Anemia   Leukocytosis   S/P laparoscopic cholecystectomy   Bile leak   Sepsis (HCC)   Bacteremia   Vaginal candidiasis   . Chlorhexidine Gluconate Cloth  6 each Topical Daily  . clotrimazole  1 Applicatorful Vaginal QHS  . enoxaparin (LOVENOX) injection  50 mg Subcutaneous Q24H  . metoCLOPramide  10 mg Oral TID AC  . pantoprazole (PROTONIX) IV  40 mg Intravenous Q24H  . saccharomyces boulardii  250 mg Oral BID    Recommendations: Continue cefepime  Assessment: She has a biliary drain and presented with fever and chills and now with bacteremia. E cloacae typically treated with cefepime and will continue.  Other antibiotics appropriately stopped by CCM.  Will not narrow to ceftriaxone even if sensitive with concern for ampc resistance but can use a fluoroquinolone if sensitive.    Antibiotics: Cefepime, metronidazole, vancomycin, fluconazole  HPI: Jordan Chung is a 30 y.o. female with a recent laproscopic cholecystectomy done on 05/09/20 and presented to the hospital on POD #4 with abdominal pain and noted a post-operative bile leak.  She underwent a CT guided biloma drain placement and ERCP with sphincterotomy and stent placement and discharged.  She returned with worse abdominal pain and underwent stent exchange with plan to continue drain for about 6 weeks.  She was seen by IR on the day of admission and still with a persistent bile leak and developed fever and chills and came in to the ED.   She was febrile to 101.9 in the ED, Tmax of 103, leukocytosis up to 12.7.  She is now growing Enterobacter cloacae in blood cultures and is on cefepime.     Review of Systems:  Constitutional: positive for fevers and chills Respiratory: negative for cough or sputum Gastrointestinal: negative for  nausea and diarrhea Integument/breast: negative for rash Hematologic/lymphatic: negative for lymphadenopathy All other systems reviewed and are negative    Past Medical History:  Diagnosis Date  . Hyperemesis gravidarum   . Postpartum depression     Social History   Tobacco Use  . Smoking status: Never Smoker  . Smokeless tobacco: Never Used  Vaping Use  . Vaping Use: Never used  Substance Use Topics  . Alcohol use: Never  . Drug use: Never    No family history on file.  No Known Allergies  Physical Exam: Constitutional: in no apparent distress, she is alert Vitals:   06/02/20 1500 06/02/20 1600  BP: (!) 142/56   Pulse: (!) 45   Resp: 17   Temp:  97.9 F (36.6 C)  SpO2: 98%    EYES: anicteric Cardiovascular: Cor RRR Respiratory: clear; GI: soft, nt; drain in place with biliary fluid Musculoskeletal: no pedal edema noted Skin: negatives: no rash Neuro: non-focal  Lab Results  Component Value Date   WBC 6.5 06/02/2020   HGB 8.6 (L) 06/02/2020   HCT 28.7 (L) 06/02/2020   MCV 81.1 06/02/2020   PLT 252 06/02/2020    Lab Results  Component Value Date   CREATININE 0.66 06/02/2020   BUN 6 06/02/2020   NA 140 06/02/2020   K 4.8 06/02/2020   CL 113 (H) 06/02/2020   CO2 23 06/02/2020    Lab Results  Component Value Date   ALT 16 06/02/2020  AST 27 06/02/2020   ALKPHOS 54 06/02/2020     Microbiology: Recent Results (from the past 240 hour(s))  Culture, blood (routine x 2)     Status: Abnormal (Preliminary result)   Collection Time: 05/31/20  3:48 PM   Specimen: BLOOD  Result Value Ref Range Status   Specimen Description   Final    BLOOD RIGHT ANTECUBITAL Performed at [facility], [address] [place], [place] [address]    Special Requests   Final    BOTTLES DRAWN AEROBIC AND ANAEROBIC Blood Culture results may not be optimal due to an inadequate volume of blood received in culture bottles Performed at [facility], [address], [place], [place] [address]    Culture  Setup Time   Final    GRAM NEGATIVE RODS IN BOTH AEROBIC AND ANAEROBIC BOTTLES CRITICAL RESULT CALLED TO, READ BACK BY AND VERIFIED WITH: PHARMD J.LEGGE AT 1148 ON 06/01/2020 BY T.SAAD.    Culture (A)  Final    ENTEROBACTER CLOACAE SUSCEPTIBILITIES TO FOLLOW Performed at [facility], [address], [place], [place] [address]    Report Status PENDING  Incomplete  Culture, blood (routine x 2)     Status: None (Preliminary result)   Collection Time: 05/31/20  3:48 PM   Specimen: BLOOD  Result Value Ref Range Status   Specimen Description   Final    BLOOD LEFT ANTECUBITAL Performed at [facility], [address] [place], [place] [address]    Special Requests   Final    BOTTLES DRAWN AEROBIC AND ANAEROBIC Blood Culture results may not be optimal due to an inadequate volume of blood received in culture bottles Performed at [facility], [address], [place], [place] [address]    Culture  Setup Time   Final    GRAM NEGATIVE RODS AEROBIC BOTTLE ONLY CRITICAL VALUE NOTED.  VALUE IS CONSISTENT WITH PREVIOUSLY REPORTED AND CALLED VALUE.    Culture   Final    GRAM NEGATIVE RODS IDENTIFICATION TO FOLLOW Performed at [facility], [address] [place], [place] [address]    Report Status PENDING  Incomplete  Resp Panel by RT-PCR (Flu A&B, Covid) Nasopharyngeal Swab     Status: None   Collection Time: 05/31/20  3:48 PM   Specimen: Nasopharyngeal Swab; Nasopharyngeal(NP) swabs in vial transport medium  Result Value Ref Range Status   SARS Coronavirus 2 by RT PCR NEGATIVE NEGATIVE Final    Comment: (NOTE) SARS-CoV-2 target nucleic acids are NOT DETECTED.  The SARS-CoV-2 RNA is generally detectable in upper respiratory specimens during the acute phase of infection. The lowest concentration of SARS-CoV-2 viral copies this assay can detect is 138 copies/mL. A  negative result does not preclude SARS-Cov-2 infection and should not be used as the sole basis for treatment or other patient management decisions. A negative result may occur with  improper specimen collection/handling, submission of specimen other than nasopharyngeal swab, presence of viral mutation(s) within the areas targeted by this assay, and inadequate number of viral copies(<138 copies/mL). A negative result must be combined with clinical observations, patient history, and epidemiological information. The expected result is Negative.  Fact Sheet for Patients:  BloggerCourse.com  Fact Sheet for Healthcare Providers:  SeriousBroker.it  This test is no t yet approved or cleared by the [place] FDA and  has been authorized for detection and/or diagnosis of SARS-CoV-2 by FDA under an Emergency Use Authorization (EUA). This  EUA will remain  in effect (meaning this test can be used) for the duration of the COVID-19 declaration under Section 564(b)(1) of the Act, 21 U.S.C.section 360bbb-3(b)(1), unless the authorization is terminated  or revoked sooner.       Influenza A by PCR NEGATIVE NEGATIVE Final   Influenza B by PCR NEGATIVE NEGATIVE Final    Comment: (NOTE) The Xpert Xpress SARS-CoV-2/FLU/RSV plus assay is intended as an aid in the diagnosis of influenza from Nasopharyngeal swab specimens and should not be used as a sole basis for treatment. Nasal washings and aspirates are unacceptable for Xpert Xpress SARS-CoV-2/FLU/RSV testing.  Fact Sheet for Patients: BloggerCourse.comhttps://www.fda.gov/media/152166/download  Fact Sheet for Healthcare Providers: SeriousBroker.ithttps://www.fda.gov/media/152162/download  This test is not yet approved or cleared by the [place] FDA and has been authorized for detection and/or diagnosis of SARS-CoV-2 by FDA under an Emergency Use Authorization (EUA). This EUA will remain in effect (meaning this test can  be used) for the duration of the COVID-19 declaration under Section 564(b)(1) of the Act, 21 U.S.C. section 360bbb-3(b)(1), unless the authorization is terminated or revoked.  Performed at [facility], [address] [place], [place] [id number]   Blood Culture ID Panel (Reflexed)     Status: Abnormal   Collection Time: 05/31/20  3:48 PM  Result Value Ref Range Status   Enterococcus faecalis NOT DETECTED NOT DETECTED Final   Enterococcus Faecium NOT DETECTED NOT DETECTED Final   Listeria monocytogenes NOT DETECTED NOT DETECTED Final   Staphylococcus species NOT DETECTED NOT DETECTED Final   Staphylococcus aureus (BCID) NOT DETECTED NOT DETECTED Final   Staphylococcus epidermidis NOT DETECTED NOT DETECTED Final   Staphylococcus lugdunensis NOT DETECTED NOT DETECTED Final   Streptococcus species NOT DETECTED NOT DETECTED Final   Streptococcus agalactiae NOT DETECTED NOT DETECTED Final   Streptococcus pneumoniae NOT DETECTED NOT DETECTED Final   Streptococcus pyogenes NOT DETECTED NOT DETECTED Final   A.calcoaceticus-baumannii NOT DETECTED NOT DETECTED Final   Bacteroides fragilis NOT DETECTED NOT DETECTED Final   Enterobacterales DETECTED (A) NOT DETECTED Final    Comment: Enterobacterales represent a large order of gram negative bacteria, not a single organism. CRITICAL RESULT CALLED TO, READ BACK BY AND VERIFIED WITH: PHARMD J.LEGGE AT 1150 ON 06/01/2020 BY T.SAAD.    Enterobacter cloacae complex DETECTED (A) NOT DETECTED Final    Comment: CRITICAL RESULT CALLED TO, READ BACK BY AND VERIFIED WITH: PHARMD J.LEGGE AT 1150 ON 06/01/2020 BY T.SAAD.    Escherichia coli NOT DETECTED NOT DETECTED Final   Klebsiella aerogenes NOT DETECTED NOT DETECTED Final   Klebsiella oxytoca NOT DETECTED NOT DETECTED Final   Klebsiella pneumoniae NOT DETECTED NOT DETECTED Final   Proteus species NOT DETECTED NOT DETECTED Final   Salmonella species NOT DETECTED NOT DETECTED Final    Serratia marcescens NOT DETECTED NOT DETECTED Final   Haemophilus influenzae NOT DETECTED NOT DETECTED Final   Neisseria meningitidis NOT DETECTED NOT DETECTED Final   Pseudomonas aeruginosa NOT DETECTED NOT DETECTED Final   Stenotrophomonas maltophilia NOT DETECTED NOT DETECTED Final   Candida albicans NOT DETECTED NOT DETECTED Final   Candida auris NOT DETECTED NOT DETECTED Final   Candida glabrata NOT DETECTED NOT DETECTED Final   Candida krusei NOT DETECTED NOT DETECTED Final   Candida parapsilosis NOT DETECTED NOT DETECTED Final   Candida tropicalis NOT DETECTED NOT DETECTED Final   Cryptococcus neoformans/gattii NOT DETECTED NOT DETECTED Final   CTX-M ESBL NOT DETECTED NOT DETECTED Final   Carbapenem resistance IMP  NOT DETECTED NOT DETECTED Final   Carbapenem resistance KPC NOT DETECTED NOT DETECTED Final   Carbapenem resistance NDM NOT DETECTED NOT DETECTED Final   Carbapenem resist OXA 48 LIKE NOT DETECTED NOT DETECTED Final   Carbapenem resistance VIM NOT DETECTED NOT DETECTED Final    Comment: Performed at [facility], [address], [place], [place] [address]  Urine culture     Status: Abnormal   Collection Time: 05/31/20  5:47 PM   Specimen: Urine, Clean Catch  Result Value Ref Range Status   Specimen Description   Final    URINE, CLEAN CATCH Performed at [facility], [address], [place], [place] [address]    Special Requests   Final    NONE Performed at [facility], [address] [place], [place] [address]    Culture MULTIPLE SPECIES PRESENT, SUGGEST RECOLLECTION (A)  Final   Report Status 06/02/2020 FINAL  Final  MRSA PCR Screening     Status: None   Collection Time: 06/02/20  1:13 AM   Specimen: Nasopharyngeal  Result Value Ref Range Status   MRSA by PCR NEGATIVE NEGATIVE Final    Comment:        The GeneXpert MRSA Assay (FDA approved for NASAL specimens only), is one component of a comprehensive MRSA  colonization surveillance program. It is not intended to diagnose MRSA infection nor to guide or monitor treatment for MRSA infections. Performed at [facility], [address], [place], [place] [address]     Gardiner Barefoot, MD [facility] Disease [facility] [email or web address] 06/02/2020, 5:38 PM

## 2020-06-02 NOTE — Consult Note (Signed)
NAME:  Jordan Chung, MRN:  [id number], DOB:  17-Oct-1990, LOS: 2 ADMISSION DATE:  05/31/2020, CONSULTATION DATE:  06/02/20 REFERRING MD: Dr. Janee Morn, CHIEF COMPLAINT: Septic shock  History of Present Illness:  This is a 30 year old white female who presented to the emergency room on 05/31/2020 for severe chills/fever/nausea/vomiting.  05/09/2020 the patient had undergone a laparoscopic cholecystectomy which was complicated with bile leak.  At the time of the LEEP patient had a JP drain placed.  On 05/31/2020 the patient went back to interventional radiology for a CT abdomen where contrast was placed retrograde up the JP.  Shortly after the procedure the patient became very diaphoretic, chills, lightheaded and presented to the emergency room.  Patient was initially admitted to the medical floor but was having ongoing soft blood pressures and therefore was transferred to the intensive care unit tonight.  Patient currently states that she is feeling better as far as abdominal pain and nausea.  She did have fevers and chills earlier today but that is currently resolved.  She denies any chest pain/chest pressure/shortness of breath/palpitations.  She states that she does have right upper quadrant tenderness that radiates from that area to the left lower quadrant.  Pertinent  Medical History  Postpartum 8 weeks Status post laparoscopic cholecystectomy   Significant Hospital Events: Including procedures, antibiotic start and stop dates in addition to other pertinent events   .     Objective   Blood pressure (!) 91/49, pulse (!) 54, temperature 97.6 F (36.4 C), temperature source Oral, resp. rate 15, height 5\' 8"  (1.727 m), weight 101.6 kg, last menstrual period 07/23/2019, SpO2 98 %, currently breastfeeding.        Intake/Output Summary (Last 24 hours) at 06/02/2020 0151 Last data filed at 06/01/2020 2200 Gross per 24 hour  Intake 257.57 ml  Output 1433 ml  Net -1175.43 ml   Filed Weights    05/31/20 1510 05/31/20 1559  Weight: 102.1 kg 101.6 kg    Examination: General: No acute distress HENT: Atraumatic/normocephalic mucous membranes are moist no significant JVD Lungs: Lungs are clear bilaterally to auscultation no wheezing rales or rhonchi noted.   Cardiovascular: Regular rate no murmur rub or gallop appreciated Abdomen: Soft, nondistended.  Positive bowel sounds.  Right upper quadrant tenderness with voluntary guarding.  Left lower and right lower quadrant mild tenderness to palpation.  No rebound or rigidity of the abdomen.  Right flank JP drain intact draining bilious material. Extremities: Distal pulse intact x4.  No cyanosis or clubbing. Neuro: Conscious alert and orient x3.  Motor is grossly intact x4 extremities only limited by abdominal discomfort.   Labs/imaging that I havepersonally reviewed  (right click and "Reselect all SmartList Selections" daily)    Assessment & Plan:  Septic shock Enterobacter bacteremia Ongoing bile leak Status post laparoscopic cholecystectomy 05/02 Vaginal candidiasis  Plan: Patient was transferred to the intensive care unit for further work-up. Patient appears to be fluid responsive.  Patient received total of 3 L normal saline bolus.  Will convert to lactated ringer if further boluses are needed. Levophed has been ordered but will be put on hold at this time since her blood pressure is adequate.  Maintain MAP greater than 60. Add vancomycin while the cultures are pending.  Continue other current antibiotics. CMP/Pro-Cal/lactic acid is pending. Monitor I/os.   Updated patient's husband at bedside.   Best practice (right click and "Reselect all SmartList Selections" daily)  Diet:  NPO Pain/Anxiety/Delirium protocol (if indicated): No VAP protocol (if indicated):  Not indicated DVT prophylaxis: LMWH GI prophylaxis: PPI Glucose control:  SSI No Central venous access:  N/A Arterial line:  N/A Foley:  N/A Mobility:  bed rest   PT consulted: N/A Last date of multidisciplinary goals of care discussion  Code Status:  full code Disposition: Transfer to the intensive care unit  Labs   CBC: Recent Labs  Lab 05/31/20 1548 05/31/20 2143 06/01/20 0420  WBC 7.8 15.2* 12.7*  NEUTROABS 6.1  --   --   HGB 12.0 10.0* 9.6*  HCT 39.3 32.3* 31.0*  MCV 78.9* 78.6* 77.9*  PLT 430* 347 296    Basic Metabolic Panel: Recent Labs  Lab 05/31/20 1548 05/31/20 2143 06/01/20 0420  NA 137  --  136  K 4.0  --  3.8  CL 100  --  103  CO2 26  --  27  GLUCOSE 98  --  109*  BUN 13  --  9  CREATININE 0.83 0.75 0.76  CALCIUM 9.3  --  8.3*   GFR: Estimated Creatinine Clearance: 125.9 mL/min (by C-G formula based on SCr of 0.76 mg/dL). Recent Labs  Lab 05/31/20 1548 05/31/20 1823 05/31/20 2143 06/01/20 0420  PROCALCITON  --   --   --  42.99  WBC 7.8  --  15.2* 12.7*  LATICACIDVEN 2.0* 1.5  --   --     Liver Function Tests: Recent Labs  Lab 05/31/20 1548 06/01/20 0420  AST 27 20  ALT 29 23  ALKPHOS 97 71  BILITOT 0.7 1.0  PROT 7.9 6.1*  ALBUMIN 4.2 3.2*   Recent Labs  Lab 05/31/20 1548  LIPASE 43   No results for input(s): AMMONIA in the last 168 hours.  ABG No results found for: PHART, PCO2ART, PO2ART, HCO3, TCO2, ACIDBASEDEF, O2SAT   Coagulation Profile: Recent Labs  Lab 05/31/20 1548 06/01/20 0420  INR 1.0 1.2    Cardiac Enzymes: No results for input(s): CKTOTAL, CKMB, CKMBINDEX, TROPONINI in the last 168 hours.  HbA1C: No results found for: HGBA1C  CBG: No results for input(s): GLUCAP in the last 168 hours.  Review of Systems:   See HPI  Past Medical History:  She,  has a past medical history of Hyperemesis gravidarum and Postpartum depression.   Surgical History:   Past Surgical History:  Procedure Laterality Date  . BILIARY STENT PLACEMENT N/A 05/15/2020   Procedure: BILIARY STENT PLACEMENT;  Surgeon: Kerin Salen, MD;  Location: [facility]COPY;  Service: Gastroenterology;   Laterality: N/A;  . BILIARY STENT PLACEMENT N/A 05/24/2020   Procedure: BILIARY STENT PLACEMENT;  Surgeon: Willis Modena, MD;  Location: [facility];  Service: Endoscopy;  Laterality: N/A;  . CHOLECYSTECTOMY N/A 05/09/2020   Procedure: LAPAROSCOPIC CHOLECYSTECTOMY WITH INTRAOPERATIVE CHOLANGIOGRAM;  Surgeon: Abigail Miyamoto, MD;  Location: [facility] ORS;  Service: General;  Laterality: N/A;  . ENDOSCOPIC RETROGRADE CHOLANGIOPANCREATOGRAPHY (ERCP) WITH PROPOFOL N/A 05/15/2020   Procedure: ENDOSCOPIC RETROGRADE CHOLANGIOPANCREATOGRAPHY (ERCP) WITH PROPOFOL;  Surgeon: Kerin Salen, MD;  Location: [facility];  Service: Gastroenterology;  Laterality: N/A;  . ERCP N/A 05/24/2020   Procedure: ENDOSCOPIC RETROGRADE CHOLANGIOPANCREATOGRAPHY (ERCP);  Surgeon: Willis Modena, MD;  Location: [facility];  Service: Endoscopy;  Laterality: N/A;  . IR RADIOLOGIST EVAL & MGMT  05/31/2020  . PANCREATIC STENT PLACEMENT  05/15/2020   Procedure: PANCREATIC STENT PLACEMENT;  Surgeon: Kerin Salen, MD;  Location: [facility];  Service: Gastroenterology;;  . [name]  05/15/2020   Procedure: [name];  Surgeon: Kerin Salen, MD;  Location: [facility];  Service: Gastroenterology;;  .  STENT REMOVAL  05/24/2020   Procedure: STENT REMOVAL;  Surgeon: Willis Modena, MD;  Location: [facility];  Service: Endoscopy;;     Social History:   reports that she has never smoked. She has never used smokeless tobacco. She reports that she does not drink alcohol and does not use drugs.   Family History:  Her family history is not on file.   Allergies No Known Allergies   Home Medications  Prior to Admission medications   Medication Sig Start Date End Date Taking? Authorizing Provider  ondansetron (ZOFRAN) 4 MG tablet Take 1 tablet (4 mg total) by mouth every 6 (six) hours. Patient taking differently: Take 4 mg by mouth every 6 (six) hours as needed for nausea or vomiting. 05/11/20  Yes Curatolo, Adam, DO  oxyCODONE (OXY  IR/ROXICODONE) 5 MG immediate release tablet Take 5-10 mg by mouth every 6 (six) hours as needed for moderate pain or severe pain.   Yes [provider]  acetaminophen (TYLENOL) 325 MG tablet Take 2 tablets (650 mg total) by mouth every 6 (six) hours as needed for mild pain (or temp > 100). Patient not taking: Reported on 05/31/2020 05/17/20   Juliet Rude, PA-C  docusate sodium (COLACE) 100 MG capsule Take 1 capsule (100 mg total) by mouth 2 (two) times daily. Patient not taking: No sig reported 12/05/19 12/04/20  Rasch, Victorino Dike I, NP  ibuprofen (ADVIL) 200 MG tablet Take 1 tablet (200 mg total) by mouth every 6 (six) hours as needed for moderate pain. Patient not taking: No sig reported 05/27/20   Zigmund Daniel., MD  methocarbamol (ROBAXIN) 500 MG tablet Take 1 tablet (500 mg total) by mouth every 6 (six) hours as needed for muscle spasms. Patient not taking: Reported on 05/31/2020 05/17/20   Juliet Rude, PA-C  Prenatal Vit-Fe Fumarate-FA (MULTIVITAMIN-PRENATAL) 27-0.8 MG TABS tablet Take 1 tablet by mouth daily. Patient not taking: Reported on 05/31/2020    [provider]  dicyclomine (BENTYL) 20 MG tablet Take 1 tablet (20 mg total) by mouth 2 (two) times daily. 03/27/19 10/06/19  Lorelee New, PA-C     Critical care time: 

## 2020-06-02 NOTE — Progress Notes (Signed)
Patient started having soft Bps prior to shift, @ shift change patient had BP of 91/45, MD paged and order given for bolus. Bolus administered and BP continued to decline, total of 2500 mls of Nacl given and BP was still 91/49. MD paged, order for 100 ml of Nacl bolus and transfer orders for ICU placed. Rapid nurse called to assess patient and patient was transferred to ICU, report given at bedside. See Mar for additional info.   91/49Abnormal  by Sabino Snipes, RN at 06/02/20 00:24:56  92/44Abnormal  by Barron Schmid, NT at 06/01/20 23:53:54  97/39Abnormal  by Barron Schmid, NT at 06/01/20 21:38:36  96/36Abnormal  by Barron Schmid, NT at 06/01/20 21:31:56  91/45Abnormal  by Barron Schmid, NT at 06/01/20 19:23:27

## 2020-06-02 NOTE — TOC Initial Note (Signed)
Transition of Care [facility]) - Initial/Assessment Note    Patient Details  Name: Jordan Chung MRN: [id number] Date of Birth: 10/07/1990  Transition of Care [facility]) CM/SW Contact:    Golda Acre, RN Phone Number: 06/02/2020, 9:14 AM  Clinical Narrative:                  30 year old white female who presented to the emergency room on 05/31/2020 for severe chills/fever/nausea/vomiting.  05/09/2020 the patient had undergone a laparoscopic cholecystectomy which was complicated with bile leak.  At the time of the LEEP patient had a JP drain placed.  On 05/31/2020 the patient went back to interventional radiology for a CT abdomen where contrast was placed retrograde up the JP.  Shortly after the procedure the patient became very diaphoretic, chills, lightheaded and presented to the emergency room.  Patient was initially admitted to the medical floor but was having ongoing soft blood pressures and therefore was transferred to the intensive care unit tonight.  Patient currently states that she is feeling better as far as abdominal pain and nausea.  She did have fevers and chills earlier today but that is currently resolved.  She denies any chest pain/chest pressure/shortness of breath/palpitations.  She states that she does have right upper quadrant tenderness that radiates from that area to the left lower quadrant.  Pertinent  Medical History  Postpartum 8 weeks Status post laparoscopic cholecystectomy   Significant Hospital Events: Including procedures, antibiotic start and stop dates in addition to other pertinent events      Expected Discharge Plan: Home/Self Care Barriers to Discharge: Continued Medical Work up   Patient Goals and CMS Choice Patient states their goals for this hospitalization and ongoing recovery are:: to go home CMS Medicare.gov Compare Post Acute Care list provided to:: Patient  PLAN: to return to home with self care lives alone.  Expected Discharge Plan and  Services Expected Discharge Plan: Home/Self Care   Discharge Planning Services: CM Consult                                          Prior Living Arrangements/Services     Patient language and need for interpreter reviewed:: Yes Do you feel safe going back to the place where you live?: Yes            Criminal Activity/Legal Involvement Pertinent to Current Situation/Hospitalization: No - Comment as needed  Activities of Daily Living Home Assistive Devices/Equipment: None ADL Screening (condition at time of admission) Patient's cognitive ability adequate to safely complete daily activities?: Yes Is the patient deaf or have difficulty hearing?: No Does the patient have difficulty seeing, even when wearing glasses/contacts?: No Does the patient have difficulty concentrating, remembering, or making decisions?: No Patient able to express need for assistance with ADLs?: Yes Does the patient have difficulty dressing or bathing?: No Independently performs ADLs?: Yes (appropriate for developmental age) Does the patient have difficulty walking or climbing stairs?: Yes Weakness of Legs: Both Weakness of Arms/Hands: None  Permission Sought/Granted                  Emotional Assessment Appearance:: Appears stated age Attitude/Demeanor/Rapport: Engaged Affect (typically observed): Calm Orientation: : Oriented to Place,Oriented to Self,Oriented to  Time,Oriented to Situation Alcohol / Substance Use: Not Applicable Psych Involvement: No (comment)  Admission diagnosis:  Bile leak [K83.9] Sepsis (HCC) [A41.9] Severe sepsis (HCC) [A41.9,  R65.20] Sepsis without acute organ dysfunction, due to unspecified organism [facility]) [A41.9] Patient Active Problem List   Diagnosis Date Noted  . Sepsis (HCC) 06/01/2020  . Bacteremia   . Vaginal candidiasis   . Severe sepsis (HCC) 05/31/2020  . Bile leak 05/23/2020  . Abdominal pain 05/22/2020  . Acute pancreatitis 05/22/2020  .  Post-ERCP acute pancreatitis 05/22/2020  . Anemia 05/22/2020  . SOB (shortness of breath) 05/22/2020  . Leukocytosis 05/22/2020  . S/P laparoscopic cholecystectomy 05/22/2020  . Post-op pain 05/13/2020   PCP:  [facility]:   [facility] [address], [place] - [address] [place] [address] Phone: [phone] Fax: [phone]     Social Determinants of Health (SDOH) Interventions    Readmission Risk Interventions No flowsheet data found.

## 2020-06-02 NOTE — Progress Notes (Addendum)
NAME:  Jordan Chung, MRN:  [id number], DOB:  1990-08-30, LOS: 2 ADMISSION DATE:  05/31/2020, CONSULTATION DATE:  06/02/20 REFERRING MD: Dr. Janee Morn, CHIEF COMPLAINT: Septic shock  History of Present Illness:  This is a 30 year old white female who presented to the emergency room on 05/31/2020 for severe chills/fever/nausea/vomiting.  05/09/2020 the patient had undergone a laparoscopic cholecystectomy which was complicated with bile leak.  At the time of the LEEP patient had a JP drain placed.  On 05/31/2020 the patient went back to interventional radiology for a CT abdomen where contrast was placed retrograde up the JP.  Shortly after the procedure the patient became very diaphoretic, chills, lightheaded and presented to the emergency room.  Patient was initially admitted to the medical floor but was having ongoing soft blood pressures and therefore was transferred to the intensive care unit tonight.  Patient currently states that she is feeling better as far as abdominal pain and nausea.  She did have fevers and chills earlier today but that is currently resolved.  She denies any chest pain/chest pressure/shortness of breath/palpitations.  She states that she does have right upper quadrant tenderness that radiates from that area to the left lower quadrant.  Pertinent  Medical History  Postpartum 8 weeks Status post laparoscopic cholecystectomy   Significant Hospital Events: Including procedures, antibiotic start and stop dates in addition to other pertinent events   . 5/2 lap chole complicated by bile leak. drain placed.  . 05/15/2020 patient underwent second ERCP with sphincterotomy and stent placement d/c 5/10 . 5/14 presented with abd pain . 5/17 ERCP with stent exchange . 5/18 discharge . 5/24 readmitted for sepsis found to be bacteremic.  . 5/26 transfer to ICU for hypotension, resolved with volume. No pressors needed.   Interval history: - Moved to ICU overnight for hypotension. Improved  with IVF. Never required pressors. Currently complains of 5/10 abdominal pain which is improving. No other complaints.   Objective   Blood pressure (!) 130/54, pulse 69, temperature 97.9 F (36.6 C), temperature source Oral, resp. rate 17, height 5\' 8"  (1.727 m), weight 101.6 kg, last menstrual period 07/23/2019, SpO2 98 %, currently breastfeeding.        Intake/Output Summary (Last 24 hours) at 06/02/2020 0854 Last data filed at 06/02/2020 0600 Gross per 24 hour  Intake 3896.98 ml  Output 1433 ml  Net 2463.98 ml   Filed Weights   05/31/20 1510 05/31/20 1559  Weight: 102.1 kg 101.6 kg    Examination: General:  Overweight young adult female in NAD Neuro:  Alert, oriented, non-focal HEENT:  [place]/AT, No JVD noted, PERRL Cardiovascular:  RRR, no MRG Lungs:  Clear bilateral breath sounds Abdomen:  Soft, non-distended. JP drain with bilious drainage.  Musculoskeletal:  No acute deformity or ROM limitation Skin:  Intact, MMM  Labs/imaging that I havepersonally reviewed  (right click and "Reselect all SmartList Selections" daily)  WBC downtrending to 6.5 Hemoglobin downtrending to 8.6 (after 3L IVF) Procal downtrending to 25.3 Lactic acid 1  Assessment & Plan:   Severe sepsis secondary to Enterobacter bacteremia: hypotension resolved with IVF alone. No pressors needed. No lactic acid elevation - Deescalate antibiotics to cefepime - ongoing hemodynamic monitoring  Ongoing bile leak Status post laparoscopic cholecystectomy 05/02 - JP drain in place - drain management per IR  Vaginal candidiasis - s/p diflucan x 1 - per primary  PCCM will sign off. Please re-consult if additional need arises.   Best practice (right click and "Reselect all SmartList Selections" daily)  Diet:  NPO Pain/Anxiety/Delirium protocol (if indicated): No VAP protocol (if indicated): Not indicated DVT prophylaxis: LMWH GI prophylaxis: PPI Glucose control:  SSI No Central venous access:   N/A Arterial line:  N/A Foley:  N/A Mobility:  bed rest  PT consulted: N/A Last date of multidisciplinary goals of care discussion  Code Status:  full code Disposition: ICU stable to transfer to progressive    Critical care time: NA     Joneen Roach, AGACNP-BC [facility]  See Amion for personal pager PCCM on call pager [phone] until 7pm. Please call Elink 7p-7a. [phone]  06/02/2020 9:24 AM

## 2020-06-03 ENCOUNTER — Telehealth (HOSPITAL_COMMUNITY): Payer: Self-pay | Admitting: Lactation Services

## 2020-06-03 DIAGNOSIS — Z9049 Acquired absence of other specified parts of digestive tract: Secondary | ICD-10-CM | POA: Diagnosis not present

## 2020-06-03 DIAGNOSIS — A419 Sepsis, unspecified organism: Secondary | ICD-10-CM | POA: Diagnosis not present

## 2020-06-03 DIAGNOSIS — R6520 Severe sepsis without septic shock: Secondary | ICD-10-CM | POA: Diagnosis not present

## 2020-06-03 DIAGNOSIS — D649 Anemia, unspecified: Secondary | ICD-10-CM | POA: Diagnosis not present

## 2020-06-03 DIAGNOSIS — D72829 Elevated white blood cell count, unspecified: Secondary | ICD-10-CM | POA: Diagnosis not present

## 2020-06-03 LAB — CBC WITH DIFFERENTIAL/PLATELET
Abs Immature Granulocytes: 0.02 10*3/uL (ref 0.00–0.07)
Basophils Absolute: 0 10*3/uL (ref 0.0–0.1)
Basophils Relative: 0 %
Eosinophils Absolute: 0.3 10*3/uL (ref 0.0–0.5)
Eosinophils Relative: 4 %
HCT: 33.1 % — ABNORMAL LOW (ref 36.0–46.0)
Hemoglobin: 9.7 g/dL — ABNORMAL LOW (ref 12.0–15.0)
Immature Granulocytes: 0 %
Lymphocytes Relative: 37 %
Lymphs Abs: 2.6 10*3/uL (ref 0.7–4.0)
MCH: 23.9 pg — ABNORMAL LOW (ref 26.0–34.0)
MCHC: 29.3 g/dL — ABNORMAL LOW (ref 30.0–36.0)
MCV: 81.5 fL (ref 80.0–100.0)
Monocytes Absolute: 0.6 10*3/uL (ref 0.1–1.0)
Monocytes Relative: 9 %
Neutro Abs: 3.5 10*3/uL (ref 1.7–7.7)
Neutrophils Relative %: 50 %
Platelets: 293 10*3/uL (ref 150–400)
RBC: 4.06 MIL/uL (ref 3.87–5.11)
RDW: 18.3 % — ABNORMAL HIGH (ref 11.5–15.5)
WBC: 7 10*3/uL (ref 4.0–10.5)
nRBC: 0 % (ref 0.0–0.2)

## 2020-06-03 LAB — COMPREHENSIVE METABOLIC PANEL
ALT: 15 U/L (ref 0–44)
AST: 14 U/L — ABNORMAL LOW (ref 15–41)
Albumin: 2.7 g/dL — ABNORMAL LOW (ref 3.5–5.0)
Alkaline Phosphatase: 52 U/L (ref 38–126)
Anion gap: 5 (ref 5–15)
BUN: 6 mg/dL (ref 6–20)
CO2: 24 mmol/L (ref 22–32)
Calcium: 8.3 mg/dL — ABNORMAL LOW (ref 8.9–10.3)
Chloride: 111 mmol/L (ref 98–111)
Creatinine, Ser: 0.59 mg/dL (ref 0.44–1.00)
GFR, Estimated: >60 mL/min (ref >60)
Glucose, Bld: 99 mg/dL (ref 70–99)
Potassium: 3.8 mmol/L (ref 3.5–5.1)
Sodium: 140 mmol/L (ref 135–145)
Total Bilirubin: 0.1 mg/dL — ABNORMAL LOW (ref 0.3–1.2)
Total Protein: 5.4 g/dL — ABNORMAL LOW (ref 6.5–8.1)

## 2020-06-03 LAB — CULTURE, BLOOD (ROUTINE X 2)

## 2020-06-03 LAB — MAGNESIUM: Magnesium: 1.9 mg/dL (ref 1.7–2.4)

## 2020-06-03 LAB — PROCALCITONIN: Procalcitonin: 15.28 ng/mL

## 2020-06-03 MED ORDER — ADULT MULTIVITAMIN W/MINERALS CH
1.0000 | ORAL_TABLET | Freq: Every day | ORAL | Status: DC
  Administered 2020-06-03 – 2020-06-04 (×2): 1 via ORAL
  Filled 2020-06-03 (×2): qty 1

## 2020-06-03 NOTE — Progress Notes (Signed)
Referring Physician(s): Thompson,D  Supervising Physician: Gilmer Mor  Patient Status:   Hospital - In-pt  Chief Complaint: Biloma, abdominal pain, bacteremia   Subjective: Patient feeling better today.  Family in room.  Continues to be bradycardic however currently denies fever, headache, chest pain, dyspnea, cough, worsening abdominal pain, back pain, nausea, vomiting or bleeding.   Allergies: Patient has no known allergies.  Medications: Prior to Admission medications   Medication Sig Start Date End Date Taking? Authorizing Provider  ondansetron (ZOFRAN) 4 MG tablet Take 1 tablet (4 mg total) by mouth every 6 (six) hours. Patient taking differently: Take 4 mg by mouth every 6 (six) hours as needed for nausea or vomiting. 05/11/20  Yes Curatolo, Adam, DO  oxyCODONE (OXY IR/ROXICODONE) 5 MG immediate release tablet Take 5-10 mg by mouth every 6 (six) hours as needed for moderate pain or severe pain.   Yes [provider]  acetaminophen (TYLENOL) 325 MG tablet Take 2 tablets (650 mg total) by mouth every 6 (six) hours as needed for mild pain (or temp > 100). Patient not taking: Reported on 05/31/2020 05/17/20   Juliet Rude, PA-C  docusate sodium (COLACE) 100 MG capsule Take 1 capsule (100 mg total) by mouth 2 (two) times daily. Patient not taking: No sig reported 12/05/19 12/04/20  Rasch, Victorino Dike I, NP  ibuprofen (ADVIL) 200 MG tablet Take 1 tablet (200 mg total) by mouth every 6 (six) hours as needed for moderate pain. Patient not taking: No sig reported 05/27/20   Zigmund Daniel., MD  methocarbamol (ROBAXIN) 500 MG tablet Take 1 tablet (500 mg total) by mouth every 6 (six) hours as needed for muscle spasms. Patient not taking: Reported on 05/31/2020 05/17/20   Juliet Rude, PA-C  Prenatal Vit-Fe Fumarate-FA (MULTIVITAMIN-PRENATAL) 27-0.8 MG TABS tablet Take 1 tablet by mouth daily. Patient not taking: Reported on 05/31/2020    [provider]   dicyclomine (BENTYL) 20 MG tablet Take 1 tablet (20 mg total) by mouth 2 (two) times daily. 03/27/19 10/06/19  Lorelee New, PA-C     Vital Signs: BP (!) 124/46   Pulse 90 Comment: Walking  Temp 98.5 F (36.9 C) (Oral)   Resp (!) 23   Ht 5\' 8"  (1.727 m)   Wt 229 lb 8 oz (104.1 kg)   LMP 07/23/2019 (Approximate) Comment: Gravida 2, Para 1  SpO2 92%   BMI 34.90 kg/m   Physical Exam awake, alert.  Right upper quadrant abdominal drain intact, insertion site okay, not significantly tender, output about 5 to 10 cc of yellow fluid/bile.  Imaging: CT ABDOMEN PELVIS WO CONTRAST  Result Date: 05/31/2020 CLINICAL DATA:  29 year old female with abdominal pain and fever. EXAM: CT ABDOMEN AND PELVIS WITHOUT CONTRAST TECHNIQUE: Multidetector CT imaging of the abdomen and pelvis was performed following the standard protocol without IV contrast. COMPARISON:  CT abdomen pelvis dated 05/21/2020. FINDINGS: Evaluation of this exam is limited in the absence of intravenous contrast. Lower chest: The visualized lung bases are clear. No intra-abdominal free air or free fluid. Hepatobiliary: The liver is unremarkable. No intrahepatic biliary dilatation. There is a percutaneous transhepatic pigtail drainage catheter with tip in the region of the gallbladder fossa. No residual fluid identified. Cholecystectomy. A biliary stent is again noted. There is minimal pneumobilia. No retained calcified stone noted in the central CBD or along the stent. Pancreas: Unremarkable. No pancreatic ductal dilatation or surrounding inflammatory changes. Spleen: Normal in size without focal abnormality. Adrenals/Urinary Tract: The adrenal  glands unremarkable. There is no hydronephrosis on either side. Excreted contrast noted in the renal collecting system and urinary bladder. Stomach/Bowel: There is no bowel obstruction or active inflammation. The appendix is normal. Vascular/Lymphatic: The abdominal aorta and IVC are grossly  unremarkable on this noncontrast CT. No portal venous gas. There is no adenopathy. Reproductive: The uterus is anteverted and grossly unremarkable. There is a 3 cm left ovarian dominant follicle corpus luteum. The right ovary is unremarkable. Other: None Musculoskeletal: No acute or significant osseous findings. IMPRESSION: 1. No acute intra-abdominal or pelvic pathology. No change in position of the subhepatic drainage catheter or CBD stent compared to prior CT. No drainable fluid collection or abscess. 2. No bowel obstruction. Normal appendix. Electronically Signed   By: Elgie Collard M.D.   On: 05/31/2020 17:02   CT ABDOMEN PELVIS W CONTRAST  Result Date: 05/31/2020 CLINICAL DATA:  30 year old female with a history of bile leak following laparoscopic cholecystectomy. A percutaneous drainage catheter was placed in the gallbladder fossa on 05/14/2020. Patient continues to have relatively high volume bilious output despite an endoscopically placed biliary stent. EXAM: CT ABDOMEN AND PELVIS WITH CONTRAST TECHNIQUE: Multidetector CT imaging of the abdomen and pelvis was performed using the standard protocol following bolus administration of intravenous contrast. CONTRAST:  ISOVUE-300 IOPAMIDOL (ISOVUE-300) INJECTION 61% COMPARISON:  Prior CT scan 05/21/2020 FINDINGS: Lower chest: No acute abnormality. Hepatobiliary: No biliary ductal dilatation. Percutaneous drainage catheter remains in good position. No evidence of undrained fluid collection. Small amount of pneumobilia not unexpected given the presence of the plastic biliary stent. Pancreas: Unremarkable. No pancreatic ductal dilatation or surrounding inflammatory changes. Spleen: Normal in size without focal abnormality. Adrenals/Urinary Tract: Normal adrenal glands. Bilateral nephrolithiasis again visualized without interval change. Unremarkable ureters and bladder. Stomach/Bowel: Stomach is within normal limits. Appendix appears normal. No evidence  of bowel wall thickening, distention, or inflammatory changes. Vascular/Lymphatic: No significant vascular findings are present. No enlarged abdominal or pelvic lymph nodes. Reproductive: Uterus and bilateral adnexa are unremarkable. Other: No abdominal wall hernia or abnormality. No abdominopelvic ascites. Musculoskeletal: No acute or significant osseous findings. IMPRESSION: 1. Well-positioned percutaneous drainage catheter in the gallbladder fossa without evidence of residual or undrained fluid. 2. Well-positioned plastic biliary stent. Electronically Signed   By: Malachy Moan M.D.   On: 05/31/2020 13:21   DG Sinus/Fist Tube Chk-Non GI  Result Date: 05/31/2020 INDICATION: 30 year old female with persistent bilious output from a percutaneous drainage catheter in the gallbladder fossa despite resolution of biloma on CT imaging and a well-positioned plastic biliary stent. EXAM: Drain injection MEDICATIONS: None. ANESTHESIA/SEDATION: None. COMPLICATIONS: None immediate. PROCEDURE: A gentle hand injection of contrast material was performed under fluoroscopic imaging. The contrast opacifies the collapsed biloma cavity in the gallbladder fossa. There is a small fistulous communication extending into the hepatic parenchyma with parenchymal opacification as well as intravasation into both the portal venous and hepatic venous systems. No evidence of direct communication with the cystic duct remanent or biliary tree. IMPRESSION: 1. Resolved biloma. 2. 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 This likely represents the source of bilious weeping. There is no direct fistulous communication with the cystic duct or biliary tree itself. Electronically Signed   By: Malachy Moan M.D.   On: 05/31/2020 13:27   DG Chest Port 1 View  Result Date:  05/31/2020 CLINICAL DATA:  Fever and chills following  recent contrast injection in biloma drain EXAM: PORTABLE CHEST 1 VIEW COMPARISON:  05/22/2020 FINDINGS: Cardiac shadow is within normal limits. Overall inspiratory effort is poor. No focal infiltrate or effusion is seen. No bony abnormality is noted. IMPRESSION: No acute abnormality noted. Electronically Signed   By: Alcide Clever M.D.   On: 05/31/2020 16:21   IR Radiologist Eval & Mgmt  Result Date: 05/31/2020 Please refer to notes tab for details about interventional procedure. (Op Note)   Labs:  CBC: Recent Labs    05/31/20 2143 06/01/20 0420 06/02/20 0150 06/03/20 0856  WBC 15.2* 12.7* 6.5 7.0  HGB 10.0* 9.6* 8.6* 9.7*  HCT 32.3* 31.0* 28.7* 33.1*  PLT 347 296 252 293    COAGS: Recent Labs    05/31/20 1548 06/01/20 0420  INR 1.0 1.2  APTT 23*  --     BMP: Recent Labs    10/05/19 2227 05/09/20 0428 05/31/20 1548 05/31/20 2143 06/01/20 0420 06/02/20 0150 06/03/20 0249  NA 138   < > 137  --  136 140 140  K 3.6   < > 4.0  --  3.8 4.8 3.8  CL 107   < > 100  --  103 113* 111  CO2 22   < > 26  --  27 23 24   GLUCOSE 108*   < > 98  --  109* 95 99  BUN 5*   < > 13  --  9 6 6   CALCIUM 8.6*   < > 9.3  --  8.3* 7.4* 8.3*  CREATININE 0.54   < > 0.83 0.75 0.76 0.66 0.59  GFRNONAA >60   < > >60 >60 >60 >60 >60  GFRAA >60  --   --   --   --   --   --    < > = values in this interval not displayed.    LIVER FUNCTION TESTS: Recent Labs    05/31/20 1548 06/01/20 0420 06/02/20 0150 06/03/20 0249  BILITOT 0.7 1.0 0.9 0.1*  AST 27 20 27  14*  ALT 29 23 16 15   ALKPHOS 97 71 54 52  PROT 7.9 6.1* 5.3* 5.4*  ALBUMIN 4.2 3.2* 2.5* 2.7*    Assessment and Plan: Patient with history of biloma formation post laparoscopic cholecystectomy with abd drain placement on 05/14/2020; also status post ERCP with stent placement; drain injection performed on 5/24 at IR clinic revealed resolved biloma but small fistulous communication  with hepatic parenchyma in the region of the gallbladder fossa with resultant contrast opacification of the parenchyma and peripheral portal venous and hepatic venous tributaries.  There was no direct fistulous communication with the cystic duct or biliary tree itself.  They be some accessory ducts of [place] in the area resulting in persistent bilious drainage; few hours post drain injection at IR clinic patient developed rigors/fever and was admitted to [facility] with Enterobacter bacteremia; currently afebrile, WBC normal, hemoglobin 9.7 up from 8.6, total bilirubin 0.1, creatinine normal, blood cultures with Enterobacter, bile cultures with gram-negative rods.  Latest output of biloma drain around 10 cc.  JP bulb replaced with gravity bag to hopefully promote resolution of leak.  Discontinue drain flushes.  Continue to closely monitor labs and drain output.  Antbx per ID. Once drain output minimal for at least 48 hr interval can consider removal; if output continues to be significant could consider drain exchange and downsizing to promote further healing.   Electronically Signed: D. , PA-C 06/03/2020, 11:57 AM  I spent a total of 15 minutes at the the patient's bedside AND on the patient's hospital floor or unit, greater than 50% of which was counseling/coordinating care for right upper abdominal biloma drain    Patient ID: Jordan BoehringerEmma Chung, female   DOB: 08/15/1990, 30 y.o.   MRN: [id number]

## 2020-06-03 NOTE — Progress Notes (Signed)
PROGRESS NOTE    Jordan Chung  OJJ:[id number] DOB: 02-07-1990 DOA: 05/31/2020 PCP: Winferd Humphrey   Chief Complaint  Patient presents with  . Abdominal Pain  . Chills    Brief Narrative:  Patient 30 year old female history of recent laparoscopic cholecystectomy with biliary stent placement May 2 with subsequent biliary leak and JP drain placement.  Patient recently hospitalized 5/14-5/18 for ongoing biliary leak status post ERCP with stent exchange (5/17) who had followed up with IR on day of admission for flushing of JP drain with contrast which showed persistent bile leak.  Patient sent home in an hour and a half later started to have uncontrollable chills, nausea, vomiting, lightheadedness presented back to the ED with concern for ongoing sepsis and suspicion for bacteremia secondary to recent JP flushing.  Patient noted on presentation to have a temperature of 101.9, hypotension with blood pressure of 74/60, tachycardic with a pulse of 122 which responded to IV fluids.  Patient also noted to have a leukocytosis with a white count of 15.2.  Urinalysis unremarkable.  CT abdomen and pelvis done showed ongoing bile leak, negative for abscess formation.  Patient admitted and placed empirically on IV antibiotics and pancultured.   Assessment & Plan:   Principal Problem:   Severe sepsis ([place]) Active Problems:   Abdominal pain   Anemia   Leukocytosis   S/P laparoscopic cholecystectomy   Bile leak   Sepsis ([place])   Bacteremia   Vaginal candidiasis  1 severe sepsis secondary to Enterobacter Cloacae bacteremia -Likely translocated from JP flushing recently. -Patient met criteria for severe sepsis with fever, tachycardia, hypotension, leukocytosis, elevated procalcitonin, lactic acid level of 2.0 on admission.  Now with blood cultures positive for bacteremia -Blood cultures positive for Enterobacter cloacae sensitive to cefepime, ciprofloxacin, gentamicin, imipenem, Bactrim and  resistant to Zosyn and cefazolin. -Patient noted to have some persistent hypotension the evening of 9/37/1696  with systolic blood pressures in the 90s despite some fluid boluses and subsequently transferred to the stepdown unit and PCCM consultation obtained.  -Blood pressure responded to fluid boluses and patient did not require any pressors.  -Systolic blood pressures improved and patient no longer hypotensive.   -Fever curve is trending down.   -Leukocytosis trended down.   -Clinical improvement.   -IV Flagyl discontinued and patient currently on IV cefepime.  -Saline lock IV fluids. -Due to patient's recent surgical history, ongoing bile leak, fistula connection with JP drain, status post recent cholecystectomy ID was consulted and patient seen in consultation by ID, Dr. Linus Salmons (06/02/2020) and recommending continuation of IV cefepime while in patient and subsequently discharging on oral ciprofloxacin for 7 more days at discharge.   -Continue current pain management.   -Supportive care.   2.  Persistent biliary leak -JP drain in place with output of 9 cc over the past 24 hours.. -Due to concern for fistulous communication between tube and adjacent liver parenchyma IR recommending no further flushing at this time to facilitate healing. -Informed IR of patient's admission. -Outpatient follow-up with IR.   3.  Status post laparoscopic cholecystectomy -Currently stable. -Outpatient follow-up with general surgery.  4.  Leukocytosis -Likely secondary to problem #1.   -Leukocytosis trending down with antibiotics  5.  Vaginal candidiasis -Diflucan 150 mg p.o. x1.   -Continue clotrimazole cream nightly.    6.  Anemia -Patient with no overt GI bleed. -Hemoglobin currently stable at 9.7.   -Saline lock IV fluids.   -Transfusion threshold hemoglobin < 7.  DVT prophylaxis: Lovenox Code Status: Full Family Communication: Updated patient and mother and husband at bedside. Disposition:    Status is: Inpatient    Dispo: The patient is from: Home              Anticipated d/c is to: Home              Patient currently on IV antibiotics, with bacteremia, not stable for discharge.    Difficult to place patient no       Consultants:   PCCM: Dr. Earlie Server 06/02/2020  Infectious disease: Dr.Comer 06/02/2020  Procedures:   CT abdomen and pelvis 05/31/2020  Chest x-ray 05/31/2020  Antimicrobials:   IV cefepime 05/31/2020>>>>  IV Rocephin 05/31/2020>>> 06/01/2020  IV Flagyl 05/31/2020>>>> 06/02/2020   Subjective: Patient sitting up in bed.  Denies any chest pain.  No shortness of breath.  Noted to be bradycardic overnight however per patient heart rate improves on ambulation.  Denies any abdominal pain.  Tolerating current diet.  Asking when she is going to be able to go home.  Mother and husband at bedside.   Objective: Vitals:   06/03/20 1026 06/03/20 1027 06/03/20 1028 06/03/20 1029  BP:      Pulse: 97 95 72 90  Resp: (!) 28 (!) 24 (!) 23 (!) 23  Temp:      TempSrc:      SpO2: 96% 97% 100% 92%  Weight:      Height:        Intake/Output Summary (Last 24 hours) at 06/03/2020 1032 Last data filed at 06/03/2020 0900 Gross per 24 hour  Intake 3408.61 ml  Output --  Net 3408.61 ml   Filed Weights   05/31/20 1510 05/31/20 1559 06/03/20 0816  Weight: 102.1 kg 101.6 kg 104.1 kg    Examination:  General exam: : NAD Respiratory system: CTAB.  No wheezes, no rhonchi, no crackles.  Speaking in full sentences.  Normal respiratory effort. Cardiovascular system: Regular rate and rhythm no murmurs rubs or gallops.  No JVD.  No lower extremity edema.  Gastrointestinal system: Abdomen soft, nontender, nondistended, positive bowel sounds.  No rebound.  No guarding.  JP drain with decreasing bilious drainage. Central nervous system: Alert and oriented. No focal neurological deficits. Extremities: Symmetric 5 x 5 power. Skin: No rashes, lesions or ulcers Psychiatry:  Judgement and insight appear normal. Mood & affect appropriate.   Data Reviewed: I have personally reviewed following labs and imaging studies  CBC: Recent Labs  Lab 05/31/20 1548 05/31/20 2143 06/01/20 0420 06/02/20 0150 06/03/20 0856  WBC 7.8 15.2* 12.7* 6.5 7.0  NEUTROABS 6.1  --   --  3.4 3.5  HGB 12.0 10.0* 9.6* 8.6* 9.7*  HCT 39.3 32.3* 31.0* 28.7* 33.1*  MCV 78.9* 78.6* 77.9* 81.1 81.5  PLT 430* 347 296 252 973    Basic Metabolic Panel: Recent Labs  Lab 05/31/20 1548 05/31/20 2143 06/01/20 0420 06/02/20 0150 06/03/20 0249  NA 137  --  136 140 140  K 4.0  --  3.8 4.8 3.8  CL 100  --  103 113* 111  CO2 26  --  _0 GLUCOSE 98  --  109* 95 99  BUN 13  --  _1 CREATININE 0.83 0.75 0.76 0.66 0.59  CALCIUM 9.3  --  8.3* 7.4* 8.3*  MG  --   --   --   --  1.9    GFR: Estimated Creatinine Clearance: 127.5 mL/min (by  C-G formula based on SCr of 0.59 mg/dL).  Liver Function Tests: Recent Labs  Lab 05/31/20 1548 06/01/20 0420 06/02/20 0150 06/03/20 0249  AST _0 14*  ALT _1 ALKPHOS 97 71 54 52  BILITOT 0.7 1.0 0.9 0.1*  PROT 7.9 6.1* 5.3* 5.4*  ALBUMIN 4.2 3.2* 2.5* 2.7*    CBG: No results for input(s): GLUCAP in the last 168 hours.   Recent Results (from the past 240 hour(s))  Culture, blood (routine x 2)     Status: Abnormal   Collection Time: 05/31/20  3:48 PM   Specimen: BLOOD  Result Value Ref Range Status   Specimen Description   Final    BLOOD RIGHT ANTECUBITAL Performed at [facility] [address] [place], [place] [address]    Special Requests   Final    BOTTLES DRAWN AEROBIC AND ANAEROBIC Blood Culture results may not be optimal due to an inadequate volume of blood received in culture bottles Performed at [facility] [address], [place], [place] [address]    Culture  Setup Time   Final    GRAM NEGATIVE RODS IN BOTH AEROBIC AND ANAEROBIC BOTTLES CRITICAL RESULT  CALLED TO, READ BACK BY AND VERIFIED WITH: PHARMD J.LEGGE AT 6861 ON 06/01/2020 BY T.SAAD. Performed at [facility], [place] [address], [place], [place] [address]    Culture ENTEROBACTER CLOACAE (A)  Final   Report Status 06/03/2020 FINAL  Final   Organism ID, Bacteria ENTEROBACTER CLOACAE  Final      Susceptibility   Enterobacter cloacae - MIC*    CEFAZOLIN >=64 RESISTANT Resistant     CEFEPIME 1 SENSITIVE Sensitive     CEFTAZIDIME 32 RESISTANT Resistant     CIPROFLOXACIN <=0.25 SENSITIVE Sensitive     GENTAMICIN <=1 SENSITIVE Sensitive     IMIPENEM <=0.25 SENSITIVE Sensitive     TRIMETH/SULFA <=20 SENSITIVE Sensitive     PIP/TAZO >=128 RESISTANT Resistant     * ENTEROBACTER CLOACAE  Culture, blood (routine x 2)     Status: Abnormal   Collection Time: 05/31/20  3:48 PM   Specimen: BLOOD  Result Value Ref Range Status   Specimen Description   Final    BLOOD LEFT ANTECUBITAL Performed at [facility] [address], [place], [place] [address]    Special Requests   Final    BOTTLES DRAWN AEROBIC AND ANAEROBIC Blood Culture results may not be optimal due to an inadequate volume of blood received in culture bottles Performed at [facility] [address] [place], [place] [address]    Culture  Setup Time   Final    GRAM NEGATIVE RODS AEROBIC BOTTLE ONLY CRITICAL VALUE NOTED.  VALUE IS CONSISTENT WITH PREVIOUSLY REPORTED AND CALLED VALUE.    Culture (A)  Final    ENTEROBACTER CLOACAE SUSCEPTIBILITIES PERFORMED ON PREVIOUS CULTURE WITHIN THE LAST 5 DAYS. Performed at [facility], [place] [address], [place], [place] [address]    Report Status 06/03/2020 FINAL  Final  Resp Panel by RT-PCR (Flu A&B, Covid) Nasopharyngeal Swab     Status: None   Collection Time: 05/31/20  3:48 PM   Specimen: Nasopharyngeal Swab; Nasopharyngeal(NP) swabs in vial transport medium  Result Value Ref Range Status   SARS Coronavirus 2 by RT PCR NEGATIVE  NEGATIVE Final    Comment: (NOTE) SARS-CoV-2 target nucleic acids are NOT DETECTED.  The SARS-CoV-2 RNA is generally detectable in  upper respiratory specimens during the acute phase of infection. The lowest concentration of SARS-CoV-2 viral copies this assay can detect is 138 copies/mL. A negative result does not preclude SARS-Cov-2 infection and should not be used as the sole basis for treatment or other patient management decisions. A negative result may occur with  improper specimen collection/handling, submission of specimen other than nasopharyngeal swab, presence of viral mutation(s) within the areas targeted by this assay, and inadequate number of viral copies(<138 copies/mL). A negative result must be combined with clinical observations, patient history, and epidemiological information. The expected result is Negative.  Fact Sheet for Patients:  EntrepreneurPulse.com.au  Fact Sheet for Healthcare Providers:  IncredibleEmployment.be  This test is no t yet approved or cleared by the [place] FDA and  has been authorized for detection and/or diagnosis of SARS-CoV-2 by FDA under an Emergency Use Authorization (EUA). This EUA will remain  in effect (meaning this test can be used) for the duration of the COVID-19 declaration under Section 564(b)(1) of the Act, 21 U.S.C.section 360bbb-3(b)(1), unless the authorization is terminated  or revoked sooner.       Influenza A by PCR NEGATIVE NEGATIVE Final   Influenza B by PCR NEGATIVE NEGATIVE Final    Comment: (NOTE) The Xpert Xpress SARS-CoV-2/FLU/RSV plus assay is intended as an aid in the diagnosis of influenza from Nasopharyngeal swab specimens and should not be used as a sole basis for treatment. Nasal washings and aspirates are unacceptable for Xpert Xpress SARS-CoV-2/FLU/RSV testing.  Fact Sheet for Patients: EntrepreneurPulse.com.au  Fact Sheet for Healthcare  Providers: IncredibleEmployment.be  This test is not yet approved or cleared by the [place] FDA and has been authorized for detection and/or diagnosis of SARS-CoV-2 by FDA under an Emergency Use Authorization (EUA). This EUA will remain in effect (meaning this test can be used) for the duration of the COVID-19 declaration under Section 564(b)(1) of the Act, 21 U.S.C. section 360bbb-3(b)(1), unless the authorization is terminated or revoked.  Performed at [facility], [place] [address] [place], [place] [address]   Blood Culture ID Panel (Reflexed)     Status: Abnormal   Collection Time: 05/31/20  3:48 PM  Result Value Ref Range Status   Enterococcus faecalis NOT DETECTED NOT DETECTED Final   Enterococcus Faecium NOT DETECTED NOT DETECTED Final   Listeria monocytogenes NOT DETECTED NOT DETECTED Final   Staphylococcus species NOT DETECTED NOT DETECTED Final   Staphylococcus aureus (BCID) NOT DETECTED NOT DETECTED Final   Staphylococcus epidermidis NOT DETECTED NOT DETECTED Final   Staphylococcus lugdunensis NOT DETECTED NOT DETECTED Final   Streptococcus species NOT DETECTED NOT DETECTED Final   Streptococcus agalactiae NOT DETECTED NOT DETECTED Final   Streptococcus pneumoniae NOT DETECTED NOT DETECTED Final   Streptococcus pyogenes NOT DETECTED NOT DETECTED Final   A.calcoaceticus-baumannii NOT DETECTED NOT DETECTED Final   Bacteroides fragilis NOT DETECTED NOT DETECTED Final   Enterobacterales DETECTED (A) NOT DETECTED Final    Comment: Enterobacterales represent a large order of gram negative bacteria, not a single organism. CRITICAL RESULT CALLED TO, READ BACK BY AND VERIFIED WITH: PHARMD J.LEGGE AT 1150 ON 06/01/2020 BY T.SAAD.    Enterobacter cloacae complex DETECTED (A) NOT DETECTED Final    Comment: CRITICAL RESULT CALLED TO, READ BACK BY AND VERIFIED WITH: PHARMD J.LEGGE AT 1150 ON 06/01/2020 BY T.SAAD.    Escherichia coli NOT  DETECTED NOT DETECTED Final   Klebsiella aerogenes NOT DETECTED NOT DETECTED Final   Klebsiella oxytoca NOT DETECTED NOT  DETECTED Final   Klebsiella pneumoniae NOT DETECTED NOT DETECTED Final   Proteus species NOT DETECTED NOT DETECTED Final   Salmonella species NOT DETECTED NOT DETECTED Final   Serratia marcescens NOT DETECTED NOT DETECTED Final   Haemophilus influenzae NOT DETECTED NOT DETECTED Final   Neisseria meningitidis NOT DETECTED NOT DETECTED Final   Pseudomonas aeruginosa NOT DETECTED NOT DETECTED Final   Stenotrophomonas maltophilia NOT DETECTED NOT DETECTED Final   Candida albicans NOT DETECTED NOT DETECTED Final   Candida auris NOT DETECTED NOT DETECTED Final   Candida glabrata NOT DETECTED NOT DETECTED Final   Candida krusei NOT DETECTED NOT DETECTED Final   Candida parapsilosis NOT DETECTED NOT DETECTED Final   Candida tropicalis NOT DETECTED NOT DETECTED Final   Cryptococcus neoformans/gattii NOT DETECTED NOT DETECTED Final   CTX-M ESBL NOT DETECTED NOT DETECTED Final   Carbapenem resistance IMP NOT DETECTED NOT DETECTED Final   Carbapenem resistance KPC NOT DETECTED NOT DETECTED Final   Carbapenem resistance NDM NOT DETECTED NOT DETECTED Final   Carbapenem resist OXA 48 LIKE NOT DETECTED NOT DETECTED Final   Carbapenem resistance VIM NOT DETECTED NOT DETECTED Final    Comment: Performed at [facility], [address], [place], [place] [address]  Urine culture     Status: Abnormal   Collection Time: 05/31/20  5:47 PM   Specimen: Urine, Clean Catch  Result Value Ref Range Status   Specimen Description   Final    URINE, CLEAN CATCH Performed at [facility], [place] [address], [place], [place] [address]    Special Requests   Final    NONE Performed at [facility], [place] [address] [place], [place] [address]    Culture MULTIPLE SPECIES PRESENT, SUGGEST RECOLLECTION (A)  Final   Report Status 06/02/2020 FINAL  Final  MRSA  PCR Screening     Status: None   Collection Time: 06/02/20  1:13 AM   Specimen: Nasopharyngeal  Result Value Ref Range Status   MRSA by PCR NEGATIVE NEGATIVE Final    Comment:        The GeneXpert MRSA Assay (FDA approved for NASAL specimens only), is one component of a comprehensive MRSA colonization surveillance program. It is not intended to diagnose MRSA infection nor to guide or monitor treatment for MRSA infections. Performed at [facility], [place] [address] [place], [place] [address]   Body fluid culture w Gram Stain     Status: None (Preliminary result)   Collection Time: 06/02/20  6:42 PM   Specimen: BILE; Body Fluid  Result Value Ref Range Status   Specimen Description   Final    BILE Performed at [facility] [address] [place], [place] [address]    Special Requests   Final    Normal Performed at [facility], [place] [address], [place], [place] [address]    Gram Stain   Final    NO WBC SEEN MODERATE GRAM NEGATIVE RODS RARE GRAM POSITIVE COCCI    Culture   Final    MODERATE GRAM NEGATIVE RODS IDENTIFICATION AND SUSCEPTIBILITIES TO FOLLOW Performed at [facility], [place] [address] [place], [place] [address]    Report Status PENDING  Incomplete         Radiology Studies: No results found.      Scheduled Meds: . Chlorhexidine Gluconate Cloth  6 each Topical Daily  . clotrimazole  1 Applicatorful Vaginal QHS  . enoxaparin (  LOVENOX) injection  50 mg Subcutaneous Q24H  . metoCLOPramide  10 mg Oral TID AC  . pantoprazole (PROTONIX) IV  40 mg Intravenous Q24H  . saccharomyces boulardii  250 mg Oral BID   Continuous Infusions: . ceFEPime (MAXIPIME) IV Stopped (06/03/20 0631)     LOS: 3 days    Time spent: 40 minutes    Irine Seal, MD [facility]   To contact the attending provider between 7A-7P or the covering provider during after hours 7P-7A, please log into  the web site [email or web address] and access using universal [place] password for that web site. If you do not have the password, please call the hospital operator.  06/03/2020, 10:32 AM

## 2020-06-03 NOTE — Progress Notes (Signed)
[facility]   Reason for visit: Follow up on bacteremia  Interval History: now out of ICU, no new complaints.  Enterobacter sensitivities noted.  No associated rash or diarrhea.  Day 4 total antibiotics Day 4 cefepime  Physical Exam: Constitutional:  Vitals:   06/03/20 1232 06/03/20 1351  BP:  123/68  Pulse:  (!) 55  Resp:  16  Temp:  97.9 F (36.6 C)  SpO2: 100% 99%   patient appears in NAD Respiratory: Normal respiratory effort; CTA B Cardiovascular: RRR GI: soft, nt, nd  Review of Systems: Constitutional: negative for fevers and chills Integument/breast: negative for rash Musculoskeletal: negative for myalgias and arthralgias  Lab Results  Component Value Date   WBC 7.0 06/03/2020   HGB 9.7 (L) 06/03/2020   HCT 33.1 (L) 06/03/2020   MCV 81.5 06/03/2020   PLT 293 06/03/2020    Lab Results  Component Value Date   CREATININE 0.59 06/03/2020   BUN 6 06/03/2020   NA 140 06/03/2020   K 3.8 06/03/2020   CL 111 06/03/2020   CO2 24 06/03/2020    Lab Results  Component Value Date   ALT 15 06/03/2020   AST 14 (L) 06/03/2020   ALKPHOS 52 06/03/2020     Microbiology: Recent Results (from the past 240 hour(s))  Culture, blood (routine x 2)     Status: Abnormal   Collection Time: 05/31/20  3:48 PM   Specimen: BLOOD  Result Value Ref Range Status   Specimen Description   Final    BLOOD RIGHT ANTECUBITAL Performed at [facility], [address], [place], [place] [address]    Special Requests   Final    BOTTLES DRAWN AEROBIC AND ANAEROBIC Blood Culture results may not be optimal due to an inadequate volume of blood received in culture bottles Performed at [facility], [address] [place], [place] [address]    Culture  Setup Time   Final    GRAM NEGATIVE RODS IN BOTH AEROBIC AND ANAEROBIC BOTTLES CRITICAL RESULT CALLED TO, READ BACK BY AND VERIFIED WITH: PHARMD J.LEGGE AT 1148 ON  06/01/2020 BY T.SAAD. Performed at [facility], [address], [place], [place] [address]    Culture ENTEROBACTER CLOACAE (A)  Final   Report Status 06/03/2020 FINAL  Final   Organism ID, Bacteria ENTEROBACTER CLOACAE  Final      Susceptibility   Enterobacter cloacae - MIC*    CEFAZOLIN >=64 RESISTANT Resistant     CEFEPIME 1 SENSITIVE Sensitive     CEFTAZIDIME 32 RESISTANT Resistant     CIPROFLOXACIN <=0.25 SENSITIVE Sensitive     GENTAMICIN <=1 SENSITIVE Sensitive     IMIPENEM <=0.25 SENSITIVE Sensitive     TRIMETH/SULFA <=20 SENSITIVE Sensitive     PIP/TAZO >=128 RESISTANT Resistant     * ENTEROBACTER CLOACAE  Culture, blood (routine x 2)     Status: Abnormal   Collection Time: 05/31/20  3:48 PM   Specimen: BLOOD  Result Value Ref Range Status   Specimen Description   Final    BLOOD LEFT ANTECUBITAL Performed at [facility], [address], [place], [place] [address]    Special Requests   Final    BOTTLES DRAWN AEROBIC AND ANAEROBIC Blood Culture results may not be optimal due to an inadequate volume of blood received in culture bottles Performed at [facility], [address], [place], [place] [address]    Culture  Setup Time   Final    GRAM NEGATIVE RODS AEROBIC BOTTLE ONLY CRITICAL VALUE NOTED.  VALUE IS CONSISTENT WITH PREVIOUSLY REPORTED AND CALLED VALUE.    Culture (A)  Final    ENTEROBACTER CLOACAE SUSCEPTIBILITIES PERFORMED ON PREVIOUS CULTURE WITHIN THE LAST 5 DAYS. Performed at [facility], [address] [place], [place] [address]    Report Status 06/03/2020 FINAL  Final  Resp Panel by RT-PCR (Flu A&B, Covid) Nasopharyngeal Swab     Status: None   Collection Time: 05/31/20  3:48 PM   Specimen: Nasopharyngeal Swab; Nasopharyngeal(NP) swabs in vial transport medium  Result Value Ref Range Status   SARS Coronavirus 2 by RT PCR NEGATIVE NEGATIVE Final    Comment: (NOTE) SARS-CoV-2 target nucleic acids are  NOT DETECTED.  The SARS-CoV-2 RNA is generally detectable in upper respiratory specimens during the acute phase of infection. The lowest concentration of SARS-CoV-2 viral copies this assay can detect is 138 copies/mL. A negative result does not preclude SARS-Cov-2 infection and should not be used as the sole basis for treatment or other patient management decisions. A negative result may occur with  improper specimen collection/handling, submission of specimen other than nasopharyngeal swab, presence of viral mutation(s) within the areas targeted by this assay, and inadequate number of viral copies(<138 copies/mL). A negative result must be combined with clinical observations, patient history, and epidemiological information. The expected result is Negative.  Fact Sheet for Patients:  BloggerCourse.com  Fact Sheet for Healthcare Providers:  SeriousBroker.it  This test is no t yet approved or cleared by the [place] FDA and  has been authorized for detection and/or diagnosis of SARS-CoV-2 by FDA under an Emergency Use Authorization (EUA). This EUA will remain  in effect (meaning this test can be used) for the duration of the COVID-19 declaration under Section 564(b)(1) of the Act, 21 U.S.C.section 360bbb-3(b)(1), unless the authorization is terminated  or revoked sooner.       Influenza A by PCR NEGATIVE NEGATIVE Final   Influenza B by PCR NEGATIVE NEGATIVE Final    Comment: (NOTE) The Xpert Xpress SARS-CoV-2/FLU/RSV plus assay is intended as an aid in the diagnosis of influenza from Nasopharyngeal swab specimens and should not be used as a sole basis for treatment. Nasal washings and aspirates are unacceptable for Xpert Xpress SARS-CoV-2/FLU/RSV testing.  Fact Sheet for Patients: BloggerCourse.com  Fact Sheet for Healthcare Providers: SeriousBroker.it  This test is not  yet approved or cleared by the [place] FDA and has been authorized for detection and/or diagnosis of SARS-CoV-2 by FDA under an Emergency Use Authorization (EUA). This EUA will remain in effect (meaning this test can be used) for the duration of the COVID-19 declaration under Section 564(b)(1) of the Act, 21 U.S.C. section 360bbb-3(b)(1), unless the authorization is terminated or revoked.  Performed at [facility], [address] [place], [place] [address]   Blood Culture ID Panel (Reflexed)     Status: Abnormal   Collection Time: 05/31/20  3:48 PM  Result Value Ref Range Status   Enterococcus faecalis NOT DETECTED NOT DETECTED Final   Enterococcus Faecium NOT DETECTED NOT DETECTED Final   Listeria monocytogenes NOT DETECTED NOT DETECTED Final   Staphylococcus species NOT DETECTED NOT DETECTED Final   Staphylococcus aureus (BCID) NOT DETECTED NOT DETECTED Final   Staphylococcus epidermidis NOT DETECTED NOT DETECTED Final   Staphylococcus lugdunensis NOT DETECTED NOT DETECTED Final   Streptococcus species NOT DETECTED NOT DETECTED Final   Streptococcus  agalactiae NOT DETECTED NOT DETECTED Final   Streptococcus pneumoniae NOT DETECTED NOT DETECTED Final   Streptococcus pyogenes NOT DETECTED NOT DETECTED Final   A.calcoaceticus-baumannii NOT DETECTED NOT DETECTED Final   Bacteroides fragilis NOT DETECTED NOT DETECTED Final   Enterobacterales DETECTED (A) NOT DETECTED Final    Comment: Enterobacterales represent a large order of gram negative bacteria, not a single organism. CRITICAL RESULT CALLED TO, READ BACK BY AND VERIFIED WITH: PHARMD J.LEGGE AT 1150 ON 06/01/2020 BY T.SAAD.    Enterobacter cloacae complex DETECTED (A) NOT DETECTED Final    Comment: CRITICAL RESULT CALLED TO, READ BACK BY AND VERIFIED WITH: PHARMD J.LEGGE AT 1150 ON 06/01/2020 BY T.SAAD.    Escherichia coli NOT DETECTED NOT DETECTED Final   Klebsiella aerogenes NOT DETECTED NOT  DETECTED Final   Klebsiella oxytoca NOT DETECTED NOT DETECTED Final   Klebsiella pneumoniae NOT DETECTED NOT DETECTED Final   Proteus species NOT DETECTED NOT DETECTED Final   Salmonella species NOT DETECTED NOT DETECTED Final   Serratia marcescens NOT DETECTED NOT DETECTED Final   Haemophilus influenzae NOT DETECTED NOT DETECTED Final   Neisseria meningitidis NOT DETECTED NOT DETECTED Final   Pseudomonas aeruginosa NOT DETECTED NOT DETECTED Final   Stenotrophomonas maltophilia NOT DETECTED NOT DETECTED Final   Candida albicans NOT DETECTED NOT DETECTED Final   Candida auris NOT DETECTED NOT DETECTED Final   Candida glabrata NOT DETECTED NOT DETECTED Final   Candida krusei NOT DETECTED NOT DETECTED Final   Candida parapsilosis NOT DETECTED NOT DETECTED Final   Candida tropicalis NOT DETECTED NOT DETECTED Final   Cryptococcus neoformans/gattii NOT DETECTED NOT DETECTED Final   CTX-M ESBL NOT DETECTED NOT DETECTED Final   Carbapenem resistance IMP NOT DETECTED NOT DETECTED Final   Carbapenem resistance KPC NOT DETECTED NOT DETECTED Final   Carbapenem resistance NDM NOT DETECTED NOT DETECTED Final   Carbapenem resist OXA 48 LIKE NOT DETECTED NOT DETECTED Final   Carbapenem resistance VIM NOT DETECTED NOT DETECTED Final    Comment: Performed at [facility], [address] [place], [place] [address]  Urine culture     Status: Abnormal   Collection Time: 05/31/20  5:47 PM   Specimen: Urine, Clean Catch  Result Value Ref Range Status   Specimen Description   Final    URINE, CLEAN CATCH Performed at [facility], [address] [place], [place] [address]    Special Requests   Final    NONE Performed at [facility], [address] [place], [place] [address]    Culture MULTIPLE SPECIES PRESENT, SUGGEST RECOLLECTION (A)  Final   Report Status 06/02/2020 FINAL  Final  MRSA PCR Screening     Status: None   Collection Time: 06/02/20  1:13 AM    Specimen: Nasopharyngeal  Result Value Ref Range Status   MRSA by PCR NEGATIVE NEGATIVE Final    Comment:        The GeneXpert MRSA Assay (FDA approved for NASAL specimens only), is one component of a comprehensive MRSA colonization surveillance program. It is not intended to diagnose MRSA infection nor to guide or monitor treatment for MRSA infections. Performed at [facility], [address] [place], [place] [address]   Body fluid culture w Gram Stain     Status: None (Preliminary result)   Collection Time: 06/02/20  6:42 PM   Specimen: BILE; Body Fluid  Result Value Ref Range Status   Specimen Description   Final  BILE Performed at [facility], [address], [place], [place] [address]    Special Requests   Final    Normal Performed at [facility], [address], [place], [place] [address]    Gram Stain   Final    NO WBC SEEN MODERATE GRAM NEGATIVE RODS RARE GRAM POSITIVE COCCI    Culture   Final    MODERATE GRAM NEGATIVE RODS IDENTIFICATION AND SUSCEPTIBILITIES TO FOLLOW Performed at [facility], [address], [place], [place] [address]    Report Status PENDING  Incomplete    Impression/Plan:  1. Bacteremia - Enterobacter in blood cultures and GNR in fluid culture c/w the same.  Her WBC is improved, she is afebrile and out of the ICU.   She should continue with cefepime while inpatient and can be discharged with oral cipro for 7 days at discharge.    2.  Biliary drain/leak - still with some drainage and followed by IR. Waiting for a reduction in drainage to remove.  Will continue a smaller drain if she continues to need the JP.  If her drain remains in place at discharge, no change in the plan in #1 with 7 days of cipro.   3.  Fever - fever curve has trended down and afebrile now.    I will sign off, call with questions

## 2020-06-03 NOTE — Progress Notes (Signed)
Initial Nutrition Assessment  DOCUMENTATION CODES:   Obesity unspecified  INTERVENTION:  - will order [employer] Breakfast with 2% milk with breakfast meals. - will Magic Cup with dinner meals, each supplement provides 290 kcal and 9 grams of protein. - will order 1 tablet multivitamin with minerals/day.    NUTRITION DIAGNOSIS:   Increased nutrient needs related to acute illness,other (see comment) (breast feeding/pumping) as evidenced by estimated needs.  GOAL:   Patient will meet greater than or equal to 90% of their needs  MONITOR:   PO intake,Supplement acceptance,Labs,Weight trends  REASON FOR ASSESSMENT:   Malnutrition Screening Tool  ASSESSMENT:   30 y.o. female with medical history of hyperemesis during pregnancy, post-partum depression, and lap chol with biliary stent placement on 5/2 with subsequent leak and JP drain placement. She had flushing of drain with contrast by IR and CT showed persistent bile leak. Once patient returned home she began to have chills, uncontrollable shaking, N/V, and lightheadedness so she presented to the ED. She is currently 8 weeks post-partum and has been breastfeeding and pumping.  Patient is on a Regular diet. Lunch order has been delivered. Mom and patient's significant other at bedside.   Patient reports that on 5/25 she was having persistent nausea which made eating difficulty/undesireable, but that nausea has now resolved. She has a good appetite today. Patient appreciative to know that family can bring in foods and beverages that she enjoys, if she would like.   Patient has been pumping. Able to talk with RN prior to visiting patient and she reports that consult has been placed for Lactation consultant.   Weight on 5/24 was 224 lb and weight today is 229 lb. Weight on 5/2 was 228 lb. Weight on 12/05/19 was 257 lb. Patient is currently 8 weeks post-partum.   Per notes: - severe sepsis 2/2 bacteremia - persistent biliary  leak - s/p lap chole on 5/2 - vaginal candidiasis  - anemia   Labs reviewed; Ca: 8.3 mg/dl. Medications reviewed; 10 mg reglan TID, 40 mg IV protonix/day, 250 mg florastor BID.    NUTRITION - FOCUSED PHYSICAL EXAM:  completed; no muscle or fat depletions, no edema at this time.  Diet Order:   Diet Order            Diet regular Room service appropriate? Yes; Fluid consistency: Thin  Diet effective now                 EDUCATION NEEDS:   No education needs have been identified at this time  Skin:  Skin Assessment: Reviewed RN Assessment  Last BM:  5/25  Height:   Ht Readings from Last 1 Encounters:  05/31/20 5\' 8"  (1.727 m)    Weight:   Wt Readings from Last 1 Encounters:  06/03/20 104.1 kg     Estimated Nutritional Needs:  Kcal:  2400-2650 kcal Protein:  125-140 grams Fluid:  >/= 2.5 L/day      06/05/20, MS, RD, LDN, CNSC Inpatient Clinical Dietitian RD pager # available in [facility]  After hours/weekend pager # available in [facility]

## 2020-06-03 NOTE — Progress Notes (Signed)
06/03/2020 Ongoing sinus bradycardia throughout the night, mostly in the mid-40s, brief drops to upper 30s. 12-lead EKG performed, per charge nurse assessment. Patient sleeping soundly, awakens easily and with no complaints. SBP 130s - 150s during the night. Cindy S. Clelia Croft BSN, RN, [facility] 06/03/2020 6:55 AM

## 2020-06-03 NOTE — Telephone Encounter (Signed)
I called and spoke with this patient after having been made aware by her ICU nurse Jordan Chung) that a Lactation order had been placed b/c patient wanted to speak with Lactation. Jordan Chung is 7.5 weeks postpartum with low milk supply.   Jordan Chung said that she didn't need to see a Advertising copywriter in-person, but a phone call would be fine. I spoke with Jordan Chung a total of 3 times so that I could gather information and to allow for the interruptions she was receiving in her care.  Jordan Chung said that at the peak of her milk supply, she could pump 3 oz/pumping session. About 2 weeks postpartum she noticed a decrease in supply. At this time, the patient is only able to pump a total of 15 mL/pumping session. Jordan Chung does have a DEBP at her bedside and she is using size 27 flanges, which she is comfortable with and also uses at home.   Patient was recently prescribed metoclopramide; patient says today will be her 2nd day on this medication. It will be interesting to see if the metoclopramide elicits a meaningful prolactin response. The patient used fenugreek during her 1st month postpartum.  Patient has already looked up lactogenic foods.Prior to this admission, patient had been pumping about 6 times/day. Patient is aware of the importance of frequent, regular pumping. Patient mentioned doing power pumping once she is home and she also hopes that being at home will aid in her milk supply.   If Lactation is needed further, we can be reached at: [phone].   [name], RN, IBCLC

## 2020-06-04 ENCOUNTER — Other Ambulatory Visit: Payer: Self-pay | Admitting: Radiology

## 2020-06-04 DIAGNOSIS — K839 Disease of biliary tract, unspecified: Secondary | ICD-10-CM

## 2020-06-04 DIAGNOSIS — R7881 Bacteremia: Secondary | ICD-10-CM

## 2020-06-04 DIAGNOSIS — D72829 Elevated white blood cell count, unspecified: Secondary | ICD-10-CM | POA: Diagnosis not present

## 2020-06-04 DIAGNOSIS — A419 Sepsis, unspecified organism: Secondary | ICD-10-CM | POA: Diagnosis not present

## 2020-06-04 DIAGNOSIS — B9689 Other specified bacterial agents as the cause of diseases classified elsewhere: Secondary | ICD-10-CM

## 2020-06-04 DIAGNOSIS — Z9049 Acquired absence of other specified parts of digestive tract: Secondary | ICD-10-CM | POA: Diagnosis not present

## 2020-06-04 LAB — CBC WITH DIFFERENTIAL/PLATELET
Abs Immature Granulocytes: 0.04 10*3/uL (ref 0.00–0.07)
Basophils Absolute: 0 10*3/uL (ref 0.0–0.1)
Basophils Relative: 0 %
Eosinophils Absolute: 0.2 10*3/uL (ref 0.0–0.5)
Eosinophils Relative: 3 %
HCT: 30.6 % — ABNORMAL LOW (ref 36.0–46.0)
Hemoglobin: 9.3 g/dL — ABNORMAL LOW (ref 12.0–15.0)
Immature Granulocytes: 1 %
Lymphocytes Relative: 41 %
Lymphs Abs: 3.1 10*3/uL (ref 0.7–4.0)
MCH: 24.2 pg — ABNORMAL LOW (ref 26.0–34.0)
MCHC: 30.4 g/dL (ref 30.0–36.0)
MCV: 79.5 fL — ABNORMAL LOW (ref 80.0–100.0)
Monocytes Absolute: 0.5 10*3/uL (ref 0.1–1.0)
Monocytes Relative: 6 %
Neutro Abs: 3.8 10*3/uL (ref 1.7–7.7)
Neutrophils Relative %: 49 %
Platelets: 311 10*3/uL (ref 150–400)
RBC: 3.85 MIL/uL — ABNORMAL LOW (ref 3.87–5.11)
RDW: 18.2 % — ABNORMAL HIGH (ref 11.5–15.5)
WBC: 7.6 10*3/uL (ref 4.0–10.5)
nRBC: 0 % (ref 0.0–0.2)

## 2020-06-04 LAB — COMPREHENSIVE METABOLIC PANEL
ALT: 17 U/L (ref 0–44)
AST: 18 U/L (ref 15–41)
Albumin: 3 g/dL — ABNORMAL LOW (ref 3.5–5.0)
Alkaline Phosphatase: 62 U/L (ref 38–126)
Anion gap: 9 (ref 5–15)
BUN: 10 mg/dL (ref 6–20)
CO2: 24 mmol/L (ref 22–32)
Calcium: 8.6 mg/dL — ABNORMAL LOW (ref 8.9–10.3)
Chloride: 107 mmol/L (ref 98–111)
Creatinine, Ser: 0.62 mg/dL (ref 0.44–1.00)
GFR, Estimated: >60 mL/min (ref >60)
Glucose, Bld: 95 mg/dL (ref 70–99)
Potassium: 3.7 mmol/L (ref 3.5–5.1)
Sodium: 140 mmol/L (ref 135–145)
Total Bilirubin: 0.2 mg/dL — ABNORMAL LOW (ref 0.3–1.2)
Total Protein: 6.3 g/dL — ABNORMAL LOW (ref 6.5–8.1)

## 2020-06-04 LAB — PROCALCITONIN: Procalcitonin: 6.2 ng/mL

## 2020-06-04 LAB — MAGNESIUM: Magnesium: 2 mg/dL (ref 1.7–2.4)

## 2020-06-04 MED ORDER — SODIUM CHLORIDE 0.9 % IV SOLN
2.0000 g | Freq: Three times a day (TID) | INTRAVENOUS | Status: DC
  Administered 2020-06-04: 2 g via INTRAVENOUS
  Filled 2020-06-04: qty 2

## 2020-06-04 MED ORDER — SACCHAROMYCES BOULARDII 250 MG PO CAPS: 250.0000 mg | ORAL_CAPSULE | Freq: Two times a day (BID) | ORAL | Status: DC

## 2020-06-04 MED ORDER — METOCLOPRAMIDE HCL 10 MG PO TABS: 10.0000 mg | ORAL_TABLET | Freq: Three times a day (TID) | ORAL | 0 refills | Status: DC

## 2020-06-04 MED ORDER — CIPROFLOXACIN HCL 500 MG PO TABS: 500.0000 mg | ORAL_TABLET | Freq: Two times a day (BID) | ORAL | 0 refills | Status: DC

## 2020-06-04 MED ORDER — CIPROFLOXACIN HCL 500 MG PO TABS: 500.0000 mg | ORAL_TABLET | Freq: Two times a day (BID) | ORAL | 0 refills | Status: AC

## 2020-06-04 MED ORDER — CLOTRIMAZOLE 1 % VA CREA: 1.0000 | TOPICAL_CREAM | Freq: Every day | VAGINAL | 0 refills | Status: DC

## 2020-06-04 MED ORDER — PANTOPRAZOLE SODIUM 40 MG PO TBEC: 40.0000 mg | DELAYED_RELEASE_TABLET | Freq: Every day | ORAL | 1 refills | Status: AC

## 2020-06-04 MED ORDER — CLOTRIMAZOLE 1 % VA CREA: 1.0000 | TOPICAL_CREAM | Freq: Every day | VAGINAL | 0 refills | Status: AC

## 2020-06-04 MED ORDER — PANTOPRAZOLE SODIUM 40 MG PO TBEC: 40.0000 mg | DELAYED_RELEASE_TABLET | Freq: Every day | ORAL | 1 refills | Status: DC

## 2020-06-04 NOTE — Progress Notes (Signed)
Referring Physician(s): Dr. Isla Pence. Thompson  Supervising Physician: Simonne ComeWatts, John  [facility]:  [facility]  Chief Complaint:  Biloma, abdominal pain, bacteremia.   Subjective:  Patient seen at beside with family members in the room. Patient reports feeling better and states that she would like the drain to be removed prior to anticipated discharge this afternoon  Allergies: Patient has no known allergies.  Medications: Prior to Admission medications   Medication Sig Start Date End Date Taking? Authorizing Provider  ondansetron (ZOFRAN) 4 MG tablet Take 1 tablet (4 mg total) by mouth every 6 (six) hours. Patient taking differently: Take 4 mg by mouth every 6 (six) hours as needed for nausea or vomiting. 05/11/20  Yes Curatolo, Adam, DO  oxyCODONE (OXY IR/ROXICODONE) 5 MG immediate release tablet Take 5-10 mg by mouth every 6 (six) hours as needed for moderate pain or severe pain.   Yes [provider]  acetaminophen (TYLENOL) 325 MG tablet Take 2 tablets (650 mg total) by mouth every 6 (six) hours as needed for mild pain (or temp > 100). Patient not taking: Reported on 05/31/2020 05/17/20   Juliet RudeJohnson, Kelly R, PA-C  ciprofloxacin (CIPRO) 500 MG tablet Take 1 tablet (500 mg total) by mouth 2 (two) times daily for 7 days. 06/04/20 06/11/20  Rodolph Bonghompson, Daniel V, MD  clotrimazole (GYNE-LOTRIMIN) 1 % vaginal cream Place 1 Applicatorful vaginally at bedtime for 4 days. 06/04/20 06/08/20  Rodolph Bonghompson, Daniel V, MD  docusate sodium (COLACE) 100 MG capsule Take 1 capsule (100 mg total) by mouth 2 (two) times daily. Patient not taking: No sig reported 12/05/19 12/04/20  Rasch, Victorino DikeJennifer I, NP  ibuprofen (ADVIL) 200 MG tablet Take 1 tablet (200 mg total) by mouth every 6 (six) hours as needed for moderate pain. Patient not taking: No sig reported 05/27/20   Zigmund DanielPowell, A Caldwell Jr., MD  methocarbamol (ROBAXIN) 500 MG tablet Take 1 tablet (500 mg total) by mouth every 6 (six) hours as needed for muscle  spasms. Patient not taking: Reported on 05/31/2020 05/17/20   Juliet RudeJohnson, Kelly R, PA-C  metoCLOPramide (REGLAN) 10 MG tablet Take 1 tablet (10 mg total) by mouth 3 (three) times daily before meals for 4 days. 06/04/20 06/08/20  Rodolph Bonghompson, Daniel V, MD  pantoprazole (PROTONIX) 40 MG tablet Take 1 tablet (40 mg total) by mouth daily. 06/04/20 06/04/21  Rodolph Bonghompson, Daniel V, MD  Prenatal Vit-Fe Fumarate-FA (MULTIVITAMIN-PRENATAL) 27-0.8 MG TABS tablet Take 1 tablet by mouth daily. Patient not taking: Reported on 05/31/2020    [provider]  saccharomyces boulardii (FLORASTOR) 250 MG capsule Take 1 capsule (250 mg total) by mouth 2 (two) times daily. 06/04/20   Rodolph Bonghompson, Daniel V, MD  dicyclomine (BENTYL) 20 MG tablet Take 1 tablet (20 mg total) by mouth 2 (two) times daily. 03/27/19 10/06/19  Lorelee NewGreen, Garrett L, PA-C     Vital Signs: BP 130/75 (BP Location: Right Arm)   Pulse (!) 52   Temp 97.9 F (36.6 C) (Oral)   Resp 14   Ht 5\' 8"  (1.727 m)   Wt 229 lb 0.9 oz (103.9 kg)   LMP 07/23/2019 (Approximate) Comment: Gravida 2, Para 1  SpO2 97%   BMI 34.83 kg/m   Physical Exam Vitals and nursing note reviewed.  Constitutional:      Appearance: She is well-developed.  HENT:     Head: Normocephalic and atraumatic.  Eyes:     Conjunctiva/sclera: Conjunctivae normal.  Pulmonary:     Effort: Pulmonary effort is normal.  Abdominal:     Comments: Positive RUQ drain to  gravity bag. site is unremarkable with no erythema, edema, tenderness, bleeding or drainage noted at exit site. Suture and stat lock in place. Dressing is clean dry and intact. Scant amount of yellow fluid noted in the lone of the gravity bag with no fluid noted to be in the drain itself.   Musculoskeletal:     Cervical back: Normal range of motion.  Neurological:     Mental Status: She is alert and oriented to person, place, and time.     Imaging: CT ABDOMEN PELVIS WO CONTRAST  Result Date: 05/31/2020 CLINICAL DATA:   30 year old female with abdominal pain and fever. EXAM: CT ABDOMEN AND PELVIS WITHOUT CONTRAST TECHNIQUE: Multidetector CT imaging of the abdomen and pelvis was performed following the standard protocol without IV contrast. COMPARISON:  CT abdomen pelvis dated 05/21/2020. FINDINGS: Evaluation of this exam is limited in the absence of intravenous contrast. Lower chest: The visualized lung bases are clear. No intra-abdominal free air or free fluid. Hepatobiliary: The liver is unremarkable. No intrahepatic biliary dilatation. There is a percutaneous transhepatic pigtail drainage catheter with tip in the region of the gallbladder fossa. No residual fluid identified. Cholecystectomy. A biliary stent is again noted. There is minimal pneumobilia. No retained calcified stone noted in the central CBD or along the stent. Pancreas: Unremarkable. No pancreatic ductal dilatation or surrounding inflammatory changes. Spleen: Normal in size without focal abnormality. Adrenals/Urinary Tract: The adrenal glands unremarkable. There is no hydronephrosis on either side. Excreted contrast noted in the renal collecting system and urinary bladder. Stomach/Bowel: There is no bowel obstruction or active inflammation. The appendix is normal. Vascular/Lymphatic: The abdominal aorta and IVC are grossly unremarkable on this noncontrast CT. No portal venous gas. There is no adenopathy. Reproductive: The uterus is anteverted and grossly unremarkable. There is a 3 cm left ovarian dominant follicle corpus luteum. The right ovary is unremarkable. Other: None Musculoskeletal: No acute or significant osseous findings. IMPRESSION: 1. No acute intra-abdominal or pelvic pathology. No change in position of the subhepatic drainage catheter or CBD stent compared to prior CT. No drainable fluid collection or abscess. 2. No bowel obstruction. Normal appendix. Electronically Signed   By: Elgie Collard M.D.   On: 05/31/2020 17:02   CT ABDOMEN PELVIS W  CONTRAST  Result Date: 05/31/2020 CLINICAL DATA:  30 year old female with a history of bile leak following laparoscopic cholecystectomy. A percutaneous drainage catheter was placed in the gallbladder fossa on 05/14/2020. Patient continues to have relatively high volume bilious output despite an endoscopically placed biliary stent. EXAM: CT ABDOMEN AND PELVIS WITH CONTRAST TECHNIQUE: Multidetector CT imaging of the abdomen and pelvis was performed using the standard protocol following bolus administration of intravenous contrast. CONTRAST:  ISOVUE-300 IOPAMIDOL (ISOVUE-300) INJECTION 61% COMPARISON:  Prior CT scan 05/21/2020 FINDINGS: Lower chest: No acute abnormality. Hepatobiliary: No biliary ductal dilatation. Percutaneous drainage catheter remains in good position. No evidence of undrained fluid collection. Small amount of pneumobilia not unexpected given the presence of the plastic biliary stent. Pancreas: Unremarkable. No pancreatic ductal dilatation or surrounding inflammatory changes. Spleen: Normal in size without focal abnormality. Adrenals/Urinary Tract: Normal adrenal glands. Bilateral nephrolithiasis again visualized without interval change. Unremarkable ureters and bladder. Stomach/Bowel: Stomach is within normal limits. Appendix appears normal. No evidence of bowel wall thickening, distention, or inflammatory changes. Vascular/Lymphatic: No significant vascular findings are present. No enlarged abdominal or pelvic lymph nodes. Reproductive: Uterus and bilateral adnexa are unremarkable. Other: No abdominal  wall hernia or abnormality. No abdominopelvic ascites. Musculoskeletal: No acute or significant osseous findings. IMPRESSION: 1. Well-positioned percutaneous drainage catheter in the gallbladder fossa without evidence of residual or undrained fluid. 2. Well-positioned plastic biliary stent. Electronically Signed   By: Malachy Moan M.D.   On: 05/31/2020 13:21   DG Sinus/Fist Tube Chk-Non  GI  Result Date: 05/31/2020 INDICATION: 30 year old female with persistent bilious output from a percutaneous drainage catheter in the gallbladder fossa despite resolution of biloma on CT imaging and a well-positioned plastic biliary stent. EXAM: Drain injection MEDICATIONS: None. ANESTHESIA/SEDATION: None. COMPLICATIONS: None immediate. PROCEDURE: A gentle hand injection of contrast material was performed under fluoroscopic imaging. The contrast opacifies the collapsed biloma cavity in the gallbladder fossa. There is a small fistulous communication extending into the hepatic parenchyma with parenchymal opacification as well as intravasation into both the portal venous and hepatic venous systems. No evidence of direct communication with the cystic duct remanent or biliary tree. IMPRESSION: 1. Resolved biloma. 2. 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 This likely represents the source of bilious weeping. There is no direct fistulous communication with the cystic duct or biliary tree itself. Electronically Signed   By: Malachy Moan M.D.   On: 05/31/2020 13:27   DG Chest Port 1 View  Result Date: 05/31/2020 CLINICAL DATA:  Fever and chills following recent contrast injection in biloma drain EXAM: PORTABLE CHEST 1 VIEW COMPARISON:  05/22/2020 FINDINGS: Cardiac shadow is within normal limits. Overall inspiratory effort is poor. No focal infiltrate or effusion is seen. No bony abnormality is noted. IMPRESSION: No acute abnormality noted. Electronically Signed   By: Alcide Clever M.D.   On: 05/31/2020 16:21   IR Radiologist Eval & Mgmt  Result Date: 05/31/2020 Please refer to notes tab for details about interventional procedure. (Op Note)   Labs:  CBC: Recent Labs    06/01/20 0420 06/02/20 0150 06/03/20 0856 06/04/20 0447   WBC 12.7* 6.5 7.0 7.6  HGB 9.6* 8.6* 9.7* 9.3*  HCT 31.0* 28.7* 33.1* 30.6*  PLT 296 252 293 311    COAGS: Recent Labs    05/31/20 1548 06/01/20 0420  INR 1.0 1.2  APTT 23*  --     BMP: Recent Labs    10/05/19 2227 05/09/20 0428 06/01/20 0420 06/02/20 0150 06/03/20 0249 06/04/20 0447  NA 138   < > 136 140 140 140  K 3.6   < > 3.8 4.8 3.8 3.7  CL 107   < > 103 113* 111 107  CO2 22   < > 27 23 24 24   GLUCOSE 108*   < > 109* 95 99 95  BUN 5*   < > 9 6 6 10   CALCIUM 8.6*   < > 8.3* 7.4* 8.3* 8.6*  CREATININE 0.54   < > 0.76 0.66 0.59 0.62  GFRNONAA >60   < > >60 >60 >60 >60  GFRAA >60  --   --   --   --   --    < > = values in this interval not displayed.    LIVER FUNCTION TESTS: Recent Labs    06/01/20 0420 06/02/20 0150 06/03/20 0249 06/04/20 0447  BILITOT 1.0 0.9 0.1* 0.2*  AST 20 27 14* 18  ALT 23 16 15 17   ALKPHOS 71 54 52 62  PROT 6.1* 5.3* 5.4* 6.3*  ALBUMIN 3.2*  2.5* 2.7* 3.0*    Assessment and Plan:  30 y.o. female inpatient. History of  lap chole on 5.2.22 complicated by a bile leak s/p  with biliary stent placement on 5.2.22. Found to have a biloma in the gallbladder fossa. IR placed a abscess drain to the gallbladder fossa on 5.7.22. 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 This likely represents the source of bilious weeping. There is no direct fistulous communication with the cystic duct or biliary tree itself.  Patient presented to the ED at [facility] on 5.25.22 with abdominal pain and fevers. Found to be in severe sepsis secondary to Enterobacter Cloacae bacteremia.  Due to concern for fistulous communication between the abscess drain and adjacent liver parenchyma IR recommended not to flush the drain. Per Epic ouptut has been 9 ml, not  documented,33 ml. Scant amount of yellow output noted to be in the line of the gravity bag with no fluid noted to be in the bag itself. Total bilirubin 0.2, Total protein 6.3. Patient remains afebrile with no leukocytosis.  Case discussed with  IR Attending Dr. Odis Luster who recommends that patient be discharged home with the drain intact. Patient to seen in follow up at drain clinic later this week  for possible drain injection (assess for possible drain removal)- IR schedulers to call patient to set up this appointment. Patient should continue to not flush drain in the interim.    Electronically Signed: Alene Mires, NP 06/04/2020, 11:46 AM   I spent a total of  at the patient's bedside AND on the patient's hospital floor or unit, greater than 50% of which was counseling/coordinating care for biloma drain

## 2020-06-04 NOTE — Discharge Summary (Signed)
Physician Discharge Summary  Jordan Chung NOI:[id number] DOB: 05/31/1990 DOA: 05/31/2020  PCP: [facility]  Admit date: 05/31/2020 Discharge date: 06/04/2020  Time spent: 55 minutes  Recommendations for Outpatient Follow-up:  1. Follow-up with Pa, [facility] in 2 weeks.  On follow-up patient will need a comprehensive metabolic profile done to follow-up on electrolytes, renal function, LFTs.  Patient will need a CBC done to follow-up on counts. 2. Follow-up with IR as scheduled.   Discharge Diagnoses:  Principal Problem:   Severe sepsis [facility]) Active Problems:   Abdominal pain   Anemia   Leukocytosis   S/P laparoscopic cholecystectomy   Bile leak   Sepsis ([place])   Bacteremia   Vaginal candidiasis   Bacteremia due to Enterobacter species   Discharge Condition: Stable and improved  Diet recommendation: Regular  Filed Weights   05/31/20 1559 06/03/20 0816 06/04/20 0616  Weight: 101.6 kg 104.1 kg 103.9 kg    History of present illness:  HPI per Dr. Buzzy Han is a 30 y.o. female with medical history significant of laparoscopic cholecystectomy with biliary stent placement on May 2 with subsequent leak and JP drain placement.  Patient was seen on day of admission by IR where she had flushing of the JP drain with contrast.  CT scan was obtained after that.  This apparently showed persistent bile leak.  Patient went home and an hour and a half later started having chills uncontrollable shaking nausea vomiting and lightheadedness.  No hives.  No itching.  Patient came back to the emergency room where her vitals indicated ongoing sepsis.  Suspicion is the flushing related to some bacteremia leading to the sepsis.  She arrived tachycardic and diaphoretic.  Currently much better.  Heart rate is back to normal.  Cultures were obtained and patient being admitted with sepsis.  Patient is 8 weeks postpartum.  Her initial blood pressure was low indicating severe  sepsis.  Currently responded to treatment with fluids..  ED Course: Temperature 101.9, initial blood pressure 74/60, pulse 122, respiratory of 30 and oxygen sat 98% on room air.  White count is 15.2, hemoglobin 10.0.  Platelets 347.  Urinalysis essentially negative lactic acid 1.5.  Chemistry essentially within normal.  Has CT abdomen pelvis earlier in the day did show continued bile leak.  Patient still have the JP drain in place.  She is being admitted for further evaluation.  Hospital Course:  1 severe sepsis secondary to Enterobacter Cloacae bacteremia -Likely translocated from JP flushing recently. -Patient met criteria for severe sepsis with fever, tachycardia, hypotension, leukocytosis, elevated procalcitonin, lactic acid level of 2.0 on admission.  Now with blood cultures positive for bacteremia -Blood cultures positive for Enterobacter cloacae sensitive to cefepime, ciprofloxacin, gentamicin, imipenem, Bactrim and resistant to Zosyn and cefazolin. -Patient noted to have some persistent hypotension the evening of 6/94/5038  with systolic blood pressures in the 90s despite some fluid boluses and subsequently transferred to the stepdown unit and PCCM consultation obtained.  -Blood pressure responded to fluid boluses and patient did not require any pressors.  -Systolic blood pressures improved and patient no longer hypotensive.   -Fever curve trended down.   -Leukocytosis trended down.   -Clinical improvement.   -IV Flagyl discontinued and patient maintained on IV cefepime.  -Saline lock IV fluids. -Due to patient's recent surgical history, ongoing bile leak, fistula connection with JP drain, status post recent cholecystectomy ID was consulted and patient seen in consultation by ID, Dr. Linus Salmons (06/02/2020) and recommended  continuation of IV cefepime while in patient and subsequently discharging on oral ciprofloxacin for 7 more days at discharge.   -Patient be discharged home in stable and  improved condition with close outpatient follow-up with PCP and IR.  2.  Persistent biliary leak -JP drain in place with decreasing output during the hospitalization. -Due to concern for fistulous communication between tube and adjacent liver parenchyma IR recommended no further flushing at this time to facilitate healing. -Informed IR of patient's admission. -Patient was assessed by IR during the hospitalization will follow up in the outpatient setting.    3.  Status post laparoscopic cholecystectomy -Currently stable. -Outpatient follow-up with general surgery.  4.  Leukocytosis -Likely secondary to problem #1.   -Leukocytosis trended down with treatment of #1 with resolution of leukocytosis by day of discharge.  5.  Vaginal candidiasis -Diflucan 150 mg p.o. x1.   -Patient was placed on clotrimazole cream nightly and be discharged on 4 more days of clotrimazole cream.   -Outpatient follow-up with PCP.  6.  Anemia -Patient with no overt GI bleed. -Hemoglobin stabilized at 9.3 by day of discharge.   -Outpatient follow-up with PCP.     Procedures:  CT abdomen and pelvis 05/31/2020  Chest x-ray 05/31/2020    Consultations:  PCCM: Dr. Earlie Server 06/02/2020  Infectious disease: Dr.Comer 06/02/2020  Interventional radiology: Dr. Earleen Newport 06/03/2020  Discharge Exam: Vitals:   06/04/20 0151 06/04/20 0616  BP: 121/69 130/75  Pulse: (!) 57 (!) 52  Resp: 14 14  Temp: 97.9 F (36.6 C) 97.9 F (36.6 C)  SpO2: 97% 97%    General: NAD Cardiovascular: RRR Respiratory: CTAB  Discharge Instructions   Discharge Instructions    Diet general   Complete by: As directed    Discharge instructions   Complete by: As directed    Please do not breast-feed while on ciprofloxacin and may resume breast-feeding 1 day after completion of ciprofloxacin regimen.   Increase activity slowly   Complete by: As directed      Allergies as of 06/04/2020   No Known Allergies      Medication List    STOP taking these medications   ibuprofen 200 MG tablet Commonly known as: ADVIL     TAKE these medications   acetaminophen 325 MG tablet Commonly known as: TYLENOL Take 2 tablets (650 mg total) by mouth every 6 (six) hours as needed for mild pain (or temp > 100).   ciprofloxacin 500 MG tablet Commonly known as: CIPRO Take 1 tablet (500 mg total) by mouth 2 (two) times daily for 7 days.   clotrimazole 1 % vaginal cream Commonly known as: GYNE-LOTRIMIN Place 1 Applicatorful vaginally at bedtime for 4 days.   docusate sodium 100 MG capsule Commonly known as: Colace Take 1 capsule (100 mg total) by mouth 2 (two) times daily.   methocarbamol 500 MG tablet Commonly known as: ROBAXIN Take 1 tablet (500 mg total) by mouth every 6 (six) hours as needed for muscle spasms.   metoCLOPramide 10 MG tablet Commonly known as: REGLAN Take 1 tablet (10 mg total) by mouth 3 (three) times daily before meals for 4 days.   multivitamin-prenatal 27-0.8 MG Tabs tablet Take 1 tablet by mouth daily.   ondansetron 4 MG tablet Commonly known as: ZOFRAN Take 1 tablet (4 mg total) by mouth every 6 (six) hours. What changed:   when to take this  reasons to take this   oxyCODONE 5 MG immediate release tablet Commonly known as: Oxy  IR/ROXICODONE Take 5-10 mg by mouth every 6 (six) hours as needed for moderate pain or severe pain.   pantoprazole 40 MG tablet Commonly known as: Protonix Take 1 tablet (40 mg total) by mouth daily.   saccharomyces boulardii 250 MG capsule Commonly known as: FLORASTOR Take 1 capsule (250 mg total) by mouth 2 (two) times daily.      No Known Allergies  Follow-up Information    Pa, [facility]. Schedule an appointment as soon as possible for a visit in 2 week(s).   Specialty: Family Medicine Contact information: [address] [address] [phone]        IR Follow up.   Why: f/u as scheduled                The results of significant diagnostics from this hospitalization (including imaging, microbiology, ancillary and laboratory) are listed below for reference.    Significant Diagnostic Studies: CT ABDOMEN PELVIS WO CONTRAST  Result Date: 05/31/2020 CLINICAL DATA:  30 year old female with abdominal pain and fever. EXAM: CT ABDOMEN AND PELVIS WITHOUT CONTRAST TECHNIQUE: Multidetector CT imaging of the abdomen and pelvis was performed following the standard protocol without IV contrast. COMPARISON:  CT abdomen pelvis dated 05/21/2020. FINDINGS: Evaluation of this exam is limited in the absence of intravenous contrast. Lower chest: The visualized lung bases are clear. No intra-abdominal free air or free fluid. Hepatobiliary: The liver is unremarkable. No intrahepatic biliary dilatation. There is a percutaneous transhepatic pigtail drainage catheter with tip in the region of the gallbladder fossa. No residual fluid identified. Cholecystectomy. A biliary stent is again noted. There is minimal pneumobilia. No retained calcified stone noted in the central CBD or along the stent. Pancreas: Unremarkable. No pancreatic ductal dilatation or surrounding inflammatory changes. Spleen: Normal in size without focal abnormality. Adrenals/Urinary Tract: The adrenal glands unremarkable. There is no hydronephrosis on either side. Excreted contrast noted in the renal collecting system and urinary bladder. Stomach/Bowel: There is no bowel obstruction or active inflammation. The appendix is normal. Vascular/Lymphatic: The abdominal aorta and IVC are grossly unremarkable on this noncontrast CT. No portal venous gas. There is no adenopathy. Reproductive: The uterus is anteverted and grossly unremarkable. There is a 3 cm left ovarian dominant follicle corpus luteum. The right ovary is unremarkable. Other: None Musculoskeletal: No acute or significant osseous findings. IMPRESSION: 1. No acute intra-abdominal or pelvic  pathology. No change in position of the subhepatic drainage catheter or CBD stent compared to prior CT. No drainable fluid collection or abscess. 2. No bowel obstruction. Normal appendix. Electronically Signed   By: Anner Crete M.D.   On: 05/31/2020 17:02   CT Abdomen Pelvis Wo Contrast  Result Date: 05/21/2020 CLINICAL DATA:  Abdominal pain and fever. Postop. Recent cholecystectomy complicated by bile leak with biliary stenting and percutaneous drainage. EXAM: CT ABDOMEN AND PELVIS WITHOUT CONTRAST TECHNIQUE: Multidetector CT imaging of the abdomen and pelvis was performed following the standard protocol without IV contrast. COMPARISON:  Most recent CT 05/13/2020 FINDINGS: Lower chest: Mild lower lobe atelectasis.  No pleural fluid. Hepatobiliary: Biliary stent in place decompressing the biliary tree. No intra or extrahepatic biliary ductal dilatation. Drainage catheter in the gallbladder fossa with resolution of previous fluid collection. No inflammation along the course of the catheter. No new hepatic abnormality. Pancreas: Faint peripancreatic stranding about the pancreatic head, series 2, image 34. No ductal dilatation. No acute pancreatic collection. Spleen: Normal in size without focal abnormality. Adrenals/Urinary Tract: Normal adrenal glands. Bilateral  intrarenal calculi without hydronephrosis or perinephric edema. No ureteral stones. No perinephric edema. Urinary bladder is minimally distended. Stomach/Bowel: Patulous distal esophagus. Unremarkable stomach. Normal positioning of the duodenum and ligament of Treitz. Normal small bowel without obstruction or inflammation. Normal appendix. Moderate colonic stool burden without colonic wall thickening or inflammation. Vascular/Lymphatic: Normal caliber abdominal aorta. Mild aortic atherosclerosis, advanced for age. No enlarged lymph nodes in the abdomen or pelvis. Reproductive: Uterus and bilateral adnexa are unremarkable. Other: Fluid collection in  the gallbladder fossa has resolved after percutaneous drainage. No new intra-abdominal collection. No free fluid. No free air. Postsurgical change of the anterior abdominal wall without subcutaneous collection. Musculoskeletal: There are no acute or suspicious osseous abnormalities. IMPRESSION: 1. Percutaneous drainage catheter in the gallbladder fossa with resolved fluid collection. 2. Biliary stent in place decompressing the biliary tree. No intra or extrahepatic biliary ductal dilatation. 3. Faint peripancreatic stranding about the pancreatic head, can be seen with acute pancreatitis. Recommend correlation with pancreatic enzymes. 4. Bilateral nonobstructing nephrolithiasis. 5. Mild aortic atherosclerosis is age advanced. Aortic Atherosclerosis (ICD10-I70.0). Electronically Signed   By: Keith Rake M.D.   On: 05/21/2020 23:46   DG Abdomen 1 View  Result Date: 05/21/2020 CLINICAL DATA:  Abdominal pain. Cholecystectomy 12 days ago, recent biliary stent placement. EXAM: ABDOMEN - 1 VIEW COMPARISON:  CT 05/13/2020 FINDINGS: Portable supine views of the abdomen obtained. Right upper quadrant biliary stent in place. Right upper quadrant drainage catheter in place. Surgical clips from cholecystectomy. No bowel dilatation or evidence of obstruction. Small to moderate colonic stool burden. Intrarenal calculi on recent CT are partially obscured by overlying stool. C shaped 2 cm small bore catheter projects over the left sacrum, unclear if this is external to the patient. Low lung volumes without significant pleural effusion or focal airspace disease. IMPRESSION: 1. Right upper quadrant biliary stent in place. Right upper quadrant drainage catheter in place. 2. Small bore C-shaped density projecting over the left pelvis is of unknown etiology, may be external to the patient. 3. Normal bowel gas pattern. Electronically Signed   By: Keith Rake M.D.   On: 05/21/2020 20:28   CT ABDOMEN PELVIS W  CONTRAST  Result Date: 05/31/2020 CLINICAL DATA:  30 year old female with a history of bile leak following laparoscopic cholecystectomy. A percutaneous drainage catheter was placed in the gallbladder fossa on 05/14/2020. Patient continues to have relatively high volume bilious output despite an endoscopically placed biliary stent. EXAM: CT ABDOMEN AND PELVIS WITH CONTRAST TECHNIQUE: Multidetector CT imaging of the abdomen and pelvis was performed using the standard protocol following bolus administration of intravenous contrast. CONTRAST:  116m ISOVUE-300 IOPAMIDOL (ISOVUE-300) INJECTION 61% COMPARISON:  Prior CT scan 05/21/2020 FINDINGS: Lower chest: No acute abnormality. Hepatobiliary: No biliary ductal dilatation. Percutaneous drainage catheter remains in good position. No evidence of undrained fluid collection. Small amount of pneumobilia not unexpected given the presence of the plastic biliary stent. Pancreas: Unremarkable. No pancreatic ductal dilatation or surrounding inflammatory changes. Spleen: Normal in size without focal abnormality. Adrenals/Urinary Tract: Normal adrenal glands. Bilateral nephrolithiasis again visualized without interval change. Unremarkable ureters and bladder. Stomach/Bowel: Stomach is within normal limits. Appendix appears normal. No evidence of bowel wall thickening, distention, or inflammatory changes. Vascular/Lymphatic: No significant vascular findings are present. No enlarged abdominal or pelvic lymph nodes. Reproductive: Uterus and bilateral adnexa are unremarkable. Other: No abdominal wall hernia or abnormality. No abdominopelvic ascites. Musculoskeletal: No acute or significant osseous findings. IMPRESSION: 1. Well-positioned percutaneous drainage catheter in the gallbladder fossa without evidence  of residual or undrained fluid. 2. Well-positioned plastic biliary stent. Electronically Signed   By: Jacqulynn Cadet M.D.   On: 05/31/2020 13:21   CT ABDOMEN PELVIS W  CONTRAST  Result Date: 05/13/2020 CLINICAL DATA:  Recent cholecystectomy with pain EXAM: CT ABDOMEN AND PELVIS WITH CONTRAST TECHNIQUE: Multidetector CT imaging of the abdomen and pelvis was performed using the standard protocol following bolus administration of intravenous contrast. CONTRAST:  137m OMNIPAQUE IOHEXOL 300 MG/ML  SOLN COMPARISON:  May 11, 2020 FINDINGS: Lower chest: There is bibasilar atelectasis. Lung bases otherwise are clear. Hepatobiliary: No focal liver lesions are appreciable. Gallbladder is absent. There is slightly more fluid in the gallbladder fossa region compared to 2 days prior with fluid collection in the gallbladder fossa region measuring 5.9 x 2.5 cm. This fluid collection is better defined than on study from 2 days prior. There is fat stranding in the medial gallbladder fossa region consistent with recent surgery, similar to 2 days prior. Trace air along the periphery of this fluid collection may be of postoperative etiology. A well-defined air collection within the fluid in the gallbladder fossa is not appreciable. Soft tissue stranding is noted along the lateral right abdomen consistent with recent surgery, essentially stable. There is no appreciable biliary duct dilatation. Pancreas: No pancreatic mass or inflammatory focus. Spleen: No splenic lesions are evident. Adrenals/Urinary Tract: Adrenals bilaterally appear normal. There is no evident mass or hydronephrosis on either side. There is a 2 mm calculus with an adjacent 3 mm calculus in the mid right kidney, unchanged. There is a 2 mm calculus in the mid left kidney, unchanged. No ureteral calculus evident on either side. Urinary bladder is midline with wall thickness within normal limits. Stomach/Bowel: There is no bowel wall or mesenteric thickening. No evident bowel obstruction. Terminal ileum appears normal. Appendix appears normal. There is no evident free air or portal venous air. Small amount of pneumoperitoneum seen 2  days prior no longer evident. Vascular/Lymphatic: No abdominal aortic aneurysm. No arterial vascular lesions are evident. Major venous structures appear patent. There is a circumaortic left renal vein, an anatomic variant. No evident adenopathy in the abdomen or pelvis. Reproductive: Uterus is anteverted. No adnexal masses evident. There is a small amount of fluid in the cul-de-sac, stable. Other: No well-defined abscess in the abdomen or pelvis. Minimal fluid tracking along the right lateral conal fascia is likely secondary to recent surgery. No ascites beyond the mild fluid in the cul-de-sac. Musculoskeletal: No blastic or lytic bone lesions. No intramuscular lesions. Evidence of recent surgery at the umbilicus level. IMPRESSION: 1. There has been an increase in a well-defined fluid collection in the gallbladder fossa compared to 2 days prior with fluid in this area currently measuring 5.9 x 2.5 cm. No appreciable air within this collection noted. Early developing infectious lesion in this area must be of concern given the increase in fluid in this area. This fluid collection is somewhat better defined than 2 days prior. Close clinical and imaging surveillance in this regard advised. 2. Postoperative soft tissue stranding in the right abdomen with slight fluid tracking into the right lateral conal fascia. No evidence suggesting abscess in these areas. 3. No bowel wall thickening or bowel obstruction. Appendix appears normal. 4. Nonobstructing calculi in each kidney. No hydronephrosis or ureteral calculus. Urinary bladder wall thickness normal. Electronically Signed   By: WLowella GripIII M.D.   On: 05/13/2020 07:55   CT ABDOMEN PELVIS W CONTRAST  Result Date: 05/11/2020 CLINICAL DATA:  Laparoscopic cholecystectomy 05/09/2020, continued abdominal pain EXAM: CT ABDOMEN AND PELVIS WITH CONTRAST TECHNIQUE: Multidetector CT imaging of the abdomen and pelvis was performed using the standard protocol following  bolus administration of intravenous contrast. CONTRAST:  190m OMNIPAQUE IOHEXOL 300 MG/ML  SOLN COMPARISON:  05/09/2020, 03/27/2019 FINDINGS: Lower chest: No acute pleural or parenchymal lung disease. Hepatobiliary: Postsurgical changes from cholecystectomy, with minimal fluid and fat stranding in the gallbladder fossa compatible with postsurgical change. No evidence of fluid collection or abscess. The liver is unremarkable without biliary dilation. Pancreas: Unremarkable. No pancreatic ductal dilatation or surrounding inflammatory changes. Spleen: Normal in size without focal abnormality. Adrenals/Urinary Tract: There are bilateral nonobstructing renal calculi. There are two 3 mm nonobstructing calculi on the right, with a single 3 mm calculus on the left. No obstructive uropathy within either kidney. The adrenals and bladder are unremarkable. Stomach/Bowel: No bowel obstruction or ileus. Normal appendix central upper pelvis. No bowel wall thickening or inflammatory change. Vascular/Lymphatic: Aortic atherosclerosis. No enlarged abdominal or pelvic lymph nodes. Reproductive: Uterus and bilateral adnexa are unremarkable. Other: There is a small amount of pneumoperitoneum within the upper abdomen consistent with recent laparoscopic surgery. Trace free fluid in the right upper quadrant and lower pelvis consistent with postsurgical change. Postsurgical change at the umbilicus from laparoscopy port. No fluid collection. No abdominal wall hernia. Musculoskeletal: No acute or destructive bony lesions. Reconstructed images demonstrate no additional findings. IMPRESSION: 1. Expected postsurgical changes from laparoscopic cholecystectomy. No evidence of fluid collection or abscess. 2. Bilateral nonobstructing renal calculi. 3.  Aortic Atherosclerosis (ICD10-I70.0). Electronically Signed   By: MRanda NgoM.D.   On: 05/11/2020 19:01   UKoreaAbdomen Limited  Result Date: 05/13/2020 CLINICAL DATA:  Pain post cholecystectomy  EXAM: ULTRASOUND ABDOMEN LIMITED RIGHT UPPER QUADRANT COMPARISON:  05/11/2020, ultrasound 05/09/2020 FINDINGS: Gallbladder: Post cholecystectomy. Question some fluid towards the gallbladder fossa the exam was terminated prematurely due to patient discomfort. Common bile duct: Nonvisualized Liver: Incomplete assessment of the liver with only partial visualization of the left lobe. Other: Exam was terminated prematurely by the patient due to inability to tolerate scanning procedure with exquisite pain and tenderness upon transducer pressure. IMPRESSION: Premature termination of the exam due to patient discomfort and inability to tolerate scanning procedure. Post cholecystectomy. Question some fluid towards the gallbladder fossa. Minimal visualization of the left lobe liver. Nonvisualization of the common bile duct. Electronically Signed   By: PLovena LeM.D.   On: 05/13/2020 06:55   DG Sinus/Fist Tube Chk-Non GI  Result Date: 05/31/2020 INDICATION: 30year old female with persistent bilious output from a percutaneous drainage catheter in the gallbladder fossa despite resolution of biloma on CT imaging and a well-positioned plastic biliary stent. EXAM: Drain injection MEDICATIONS: None. ANESTHESIA/SEDATION: None. COMPLICATIONS: None immediate. PROCEDURE: A gentle hand injection of contrast material was performed under fluoroscopic imaging. The contrast opacifies the collapsed biloma cavity in the gallbladder fossa. There is a small fistulous communication extending into the hepatic parenchyma with parenchymal opacification as well as intravasation into both the portal venous and hepatic venous systems. No evidence of direct communication with the cystic duct remanent or biliary tree. IMPRESSION: 1. Resolved biloma. 2. 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 This likely represents the source of bilious weeping. There is no direct fistulous communication with the cystic duct or biliary tree itself. Electronically Signed   By: HMyrle Sheng  Laurence Ferrari M.D.   On: 05/31/2020 13:27   DG Chest Port 1 View  Result Date: 05/31/2020 CLINICAL DATA:  Fever and chills following recent contrast injection in biloma drain EXAM: PORTABLE CHEST 1 VIEW COMPARISON:  05/22/2020 FINDINGS: Cardiac shadow is within normal limits. Overall inspiratory effort is poor. No focal infiltrate or effusion is seen. No bony abnormality is noted. IMPRESSION: No acute abnormality noted. Electronically Signed   By: Inez Catalina M.D.   On: 05/31/2020 16:21   DG CHEST PORT 1 VIEW  Result Date: 05/22/2020 CLINICAL DATA:  Increased shortness of breath. EXAM: PORTABLE CHEST 1 VIEW COMPARISON:  05/13/2020 FINDINGS: There is stable elevation of the RIGHT hemidiaphragm. Heart size is accentuated by technique. There are scattered areas of subsegmental atelectasis. No consolidations or pulmonary edema. IMPRESSION: Shallow inflation.  Subsegmental atelectasis. Electronically Signed   By: Nolon Nations M.D.   On: 05/22/2020 14:25   DG CHEST PORT 1 VIEW  Result Date: 05/13/2020 CLINICAL DATA:  One month postpartum with recent laparoscopic cholecystectomy. Right upper quadrant abdominal pain. EXAM: PORTABLE CHEST 1 VIEW COMPARISON:  None. FINDINGS: The heart size and mediastinal contours are within normal limits. Low lung volumes. No focal airspace disease. The visualized skeletal structures are unremarkable. IMPRESSION: No evidence of acute cardiopulmonary disease. Electronically Signed   By: Maurine Simmering   On: 05/13/2020 09:40   DG ERCP  Result Date: 05/24/2020 CLINICAL DATA:  ERCP with biliary stent exchange for bile leak. EXAM: ERCP TECHNIQUE: Multiple spot images obtained with the fluoroscopic device and submitted for interpretation post-procedure. FLUOROSCOPY TIME:  45 seconds  COMPARISON:  ERCP-05/15/2020 Nuclear medicine HIDA scan-05/22/2020; 05/13/2020 CT abdomen and pelvis-05/21/2020; 05/13/2020 CT guided percutaneous drainage catheter placement into gallbladder fossa-05/14/2020 FINDINGS: Six spot fluoroscopic images of the right upper abdominal quadrant during ERCP are provided for review. Initial image demonstrates an ERCP probe overlying the right upper abdominal quadrant. Pre-existing internal biliary stent overlies expected location of the CBD. A percutaneous drainage catheter overlies expected location of the gallbladder fossa. Cholecystectomy clips. Subsequent images demonstrate interval removal of pre-existing biliary stent with selective cannulation and opacification of the CBD. There is opacification of the cystic duct without definitive evidence of contrast extravasation or passage of contrast to the level of the percutaneous drainage catheter however examination degraded secondary to technique. Completion images demonstrate placement of a new biliary stent overlying expected location of the CBD traversing the expected confluence with the cystic duct. IMPRESSION: ERCP with internal biliary stent exchange is above. These images were submitted for radiologic interpretation only. Please see the procedural report for the amount of contrast and the fluoroscopy time utilized. Electronically Signed   By: Sandi Mariscal M.D.   On: 05/24/2020 11:20   DG ERCP BILIARY & PANCREATIC DUCTS  Result Date: 05/15/2020 CLINICAL DATA:  30 year old female with bile leak following cholecystectomy. EXAM: ERCP TECHNIQUE: Multiple spot images obtained with the fluoroscopic device and submitted for interpretation post-procedure. FLUOROSCOPY TIME:  Fluoroscopy Time:  2 minutes 30 seconds Radiation Exposure Index (if provided by the fluoroscopic device): 58.3 mGy Number of Acquired Spot Images: 0 COMPARISON:  None. FINDINGS: A total of 7 intraoperative spot images are submitted for review. The images  demonstrate a flexible endoscope in the descending duodenum. Initial wire cannulation of the pancreatic duct with placement of a plastic pancreatic stent. Subsequently, the common bile duct is cannulated. Contrast injection performed. No frank extravasation on the provided images. A plastic biliary stent is then placed. Percutaneous drainage catheter noted in the  right upper quadrant consistent with recent gallbladder fossa drain placement. IMPRESSION: ERCP with placement of pancreatic and biliary plastic stents. These images were submitted for radiologic interpretation only. Please see the procedural report for the amount of contrast and the fluoroscopy time utilized. Electronically Signed   By: Jacqulynn Cadet M.D.   On: 05/15/2020 10:23   CT IMAGE GUIDED DRAINAGE BY PERCUTANEOUS CATHETER  Result Date: 05/14/2020 INDICATION: 30 year old female with history of biloma formation the gallbladder fossa status post laparoscopic cholecystectomy. EXAM: CT IMAGE GUIDED DRAINAGE BY PERCUTANEOUS CATHETER COMPARISON:  None. MEDICATIONS: The patient is currently admitted to the hospital and receiving intravenous antibiotics. The antibiotics were administered within an appropriate time frame prior to the initiation of the procedure. ANESTHESIA/SEDATION: Moderate (conscious) sedation was employed during this procedure. A total of Versed 4 mg and Fentanyl 100 mcg was administered intravenously. Moderate Sedation Time: 15 minutes. The patient's level of consciousness and vital signs were monitored continuously by radiology nursing throughout the procedure under my direct supervision. CONTRAST:  None COMPLICATIONS: None immediate. PROCEDURE: Informed written consent was obtained from the patient after a discussion of the risks, benefits and alternatives to treatment. The patient was placed supine on the CT gantry and a pre procedural CT was performed re-demonstrating the known abscess/fluid collection within the gallbladder  fossa. The procedure was planned. A timeout was performed prior to the initiation of the procedure. The right upper quadrant was prepped and draped in the usual sterile fashion. The overlying soft tissues were anesthetized with 1% lidocaine with epinephrine. Appropriate trajectory was planned with the use of a 22 gauge spinal needle. A 21 gauge Chiba needle was advanced into the abscess/fluid collection followed by placement of a 0.018 inch wire. Appropriate position was confirmed with a limited CT scan. Over the wire, a 6 [place] transition dilator was placed followed by a short Amplatz super stiff wire was coiled within the collection. Appropriate positioning was confirmed with a limited CT scan. The tract was serially dilated allowing placement of a 10.2 [place] all-purpose drainage catheter. Appropriate positioning was confirmed with a limited postprocedural CT scan. Approximately 20 ml of thin maroon/brown fluid was aspirated. The tube was connected to a bulb suction and sutured in place. A dressing was placed. The patient tolerated the procedure well without immediate post procedural complication. IMPRESSION: Successful CT guided placement of a 10.2 [place] all purpose drain catheter into the gallbladder fossa fluid collection with aspiration of approximately 20 mL of dark brown, maroon fluid. Samples were sent to the laboratory as requested by the ordering clinical team. Ruthann Cancer, MD [facility] Radiology Electronically Signed   By: Ruthann Cancer MD   On: 05/14/2020 15:02   NM HEPATOBILIARY LEAK (POST-SURGICAL)  Result Date: 05/22/2020 CLINICAL DATA:  Status post cholecystectomy with previously suspected biliary leak EXAM: NUCLEAR MEDICINE HEPATOBILIARY IMAGING TECHNIQUE: Sequential images of the abdomen were obtained out to 120 minutes following intravenous administration of radiopharmaceutical. Note that biliary drain clamped for this study.  RADIOPHARMACEUTICALS:  5.4 mCi Tc-52m Choletec IV COMPARISON:  May 13, 2020 nuclear medicine hepatobiliary imaging study; ERCP May 15, 2020. CT abdomen and pelvis May 21, 2020 FINDINGS: Liver uptake of radiotracer is normal. There is prompt visualization of small bowel indicating patency of the common bile duct. There is a small focus of radiotracer uptake in the region of the gallbladder fossa, similar to findings on the previous study. No other areas of abnormal radiotracer uptake noted. IMPRESSION: Persistent small focus  of abnormal uptake in the gallbladder fossa region. A small persistent contained leak is suspected. Contrast in a gallbladder remnant could present in this manner. Note that no gallbladder remnant evident on recent ERCP examination, supporting small residual contained biliary leak in the gallbladder fossa as a more likely differential consideration. Study otherwise unremarkable. Note patency of the common bile duct. Electronically Signed   By: Lowella Grip III M.D.   On: 05/22/2020 17:55   NM HEPATOBILIARY LEAK (POST-SURGICAL)  Result Date: 05/13/2020 CLINICAL DATA:  Status post cholecystectomy with right upper quadrant pain. EXAM: NUCLEAR MEDICINE HEPATOBILIARY IMAGING TECHNIQUE: Sequential images of the abdomen were obtained out to 60 minutes following intravenous administration of radiopharmaceutical. RADIOPHARMACEUTICALS:  4.9 mCi Tc-47m Choletec IV COMPARISON:  CT 05/13/20 FINDINGS: Prompt uptake and biliary excretion of activity by the liver is seen. Biliary activity passes into small bowel, consistent with patent common bile duct. At the end of the first hour a small focus mild increased radiotracer accumulation is noted within the gallbladder fossa. During the second hour there is persistent and mildly progressive radiotracer accumulation within the gallbladder fossa. IMPRESSION: 1. Small focus of mild progressive radiotracer accumulation within the gallbladder fossa. Primary  differential consideration includes: small contained leak versus radiotracer accumulation within gallbladder remnant. 2. Common bile duct appears patent with most of the radiotracer accumulating within the small bowel loops. 3. These results were shared via EPIC CHAT at the time of interpretation on 05/13/2020 at 12:53 pm to [facility] who acknowledged these results. Electronically Signed   By: TKerby MoorsM.D.   On: 05/13/2020 12:53   IR Radiologist Eval & Mgmt  Result Date: 05/31/2020 Please refer to notes tab for details about interventional procedure. (Op Note)  UKoreaAbdomen Limited RUQ (LIVER/GB)  Result Date: 05/09/2020 CLINICAL DATA:  Acute right upper quadrant pain EXAM: ULTRASOUND ABDOMEN LIMITED RIGHT UPPER QUADRANT COMPARISON:  CT 03/27/2019, ultrasound 03/25/2019 FINDINGS: Gallbladder: Echogenic, shadowing gallstone seen towards the gallbladder neck measuring to 1.8 cm in diameter. Gallbladder wall thickness is within normal limits. No pericholecystic fluid or inflammation. Sonographic MPercell Millersign is reportedly negative. Common bile duct: Diameter: 7.3 mm, borderline dilated. No visible intraductal gallstones though portions of the distal common bile duct are poorly visualized. Liver: No focal lesion identified. Within normal limits in parenchymal echogenicity. Portal vein is patent on color Doppler imaging with normal direction of blood flow towards the liver. Other: None. IMPRESSION: 1. Cholelithiasis.  No evidence of acute cholecystitis. 2. Borderline biliary ductal dilatation. Recommend correlation with serologies and if abnormal, MRCP could be obtained. This recommendation follows ACR consensus guidelines: White Paper of the ACR Incidental Findings Committee II on Gallbladder and Biliary Findings. J Am Coll Radiol 2013;10:953-956. Electronically Signed   By: PLovena LeM.D.   On: 05/09/2020 06:53    Microbiology: Recent Results (from the past 240 hour(s))  Culture, blood  (routine x 2)     Status: Abnormal   Collection Time: 05/31/20  3:48 PM   Specimen: BLOOD  Result Value Ref Range Status   Specimen Description   Final    BLOOD RIGHT ANTECUBITAL Performed at [facility] [place] [address]   Special Requests   Final    BOTTLES DRAWN AEROBIC AND ANAEROBIC Blood Culture results may not be optimal due to an inadequate volume of blood received in culture bottles Performed at [facility], [place] [address]  Culture  Setup Time   Final    GRAM NEGATIVE RODS IN BOTH AEROBIC AND ANAEROBIC BOTTLES CRITICAL RESULT CALLED TO, READ BACK BY AND VERIFIED WITH: PHARMD J.LEGGE AT 2831 ON 06/01/2020 BY T.SAAD. Performed at [facility], [place] [address], [place], [place] [address]    Culture ENTEROBACTER CLOACAE (A)  Final   Report Status 06/03/2020 FINAL  Final   Organism ID, Bacteria ENTEROBACTER CLOACAE  Final      Susceptibility   Enterobacter cloacae - MIC*    CEFAZOLIN >=64 RESISTANT Resistant     CEFEPIME 1 SENSITIVE Sensitive     CEFTAZIDIME 32 RESISTANT Resistant     CIPROFLOXACIN <=0.25 SENSITIVE Sensitive     GENTAMICIN <=1 SENSITIVE Sensitive     IMIPENEM <=0.25 SENSITIVE Sensitive     TRIMETH/SULFA <=20 SENSITIVE Sensitive     PIP/TAZO >=128 RESISTANT Resistant     * ENTEROBACTER CLOACAE  Culture, blood (routine x 2)     Status: Abnormal   Collection Time: 05/31/20  3:48 PM   Specimen: BLOOD  Result Value Ref Range Status   Specimen Description   Final    BLOOD LEFT ANTECUBITAL Performed at [facility] [address], [place], [place] [address]    Special Requests   Final    BOTTLES DRAWN AEROBIC AND ANAEROBIC Blood Culture results may not be optimal due to an inadequate volume of blood received in culture bottles Performed at [facility] [address], [place], [place] [address]    Culture  Setup Time    Final    GRAM NEGATIVE RODS AEROBIC BOTTLE ONLY CRITICAL VALUE NOTED.  VALUE IS CONSISTENT WITH PREVIOUSLY REPORTED AND CALLED VALUE.    Culture (A)  Final    ENTEROBACTER CLOACAE SUSCEPTIBILITIES PERFORMED ON PREVIOUS CULTURE WITHIN THE LAST 5 DAYS. Performed at [facility], [place] [address] [place], [place] [address]    Report Status 06/03/2020 FINAL  Final  Resp Panel by RT-PCR (Flu A&B, Covid) Nasopharyngeal Swab     Status: None   Collection Time: 05/31/20  3:48 PM   Specimen: Nasopharyngeal Swab; Nasopharyngeal(NP) swabs in vial transport medium  Result Value Ref Range Status   SARS Coronavirus 2 by RT PCR NEGATIVE NEGATIVE Final    Comment: (NOTE) SARS-CoV-2 target nucleic acids are NOT DETECTED.  The SARS-CoV-2 RNA is generally detectable in upper respiratory specimens during the acute phase of infection. The lowest concentration of SARS-CoV-2 viral copies this assay can detect is 138 copies/mL. A negative result does not preclude SARS-Cov-2 infection and should not be used as the sole basis for treatment or other patient management decisions. A negative result may occur with  improper specimen collection/handling, submission of specimen other than nasopharyngeal swab, presence of viral mutation(s) within the areas targeted by this assay, and inadequate number of viral copies(<138 copies/mL). A negative result must be combined with clinical observations, patient history, and epidemiological information. The expected result is Negative.  Fact Sheet for Patients:  EntrepreneurPulse.com.au  Fact Sheet for Healthcare Providers:  IncredibleEmployment.be  This test is no t yet approved or cleared by the [place] FDA and  has been authorized for detection and/or diagnosis of SARS-CoV-2 by FDA under an Emergency Use Authorization (EUA). This EUA will remain  in effect (meaning this test can be used) for the duration of  the COVID-19 declaration under Section 564(b)(1) of the Act, 21 U.S.C.section 360bbb-3(b)(1), unless the authorization is terminated  or revoked sooner.  Influenza A by PCR NEGATIVE NEGATIVE Final   Influenza B by PCR NEGATIVE NEGATIVE Final    Comment: (NOTE) The Xpert Xpress SARS-CoV-2/FLU/RSV plus assay is intended as an aid in the diagnosis of influenza from Nasopharyngeal swab specimens and should not be used as a sole basis for treatment. Nasal washings and aspirates are unacceptable for Xpert Xpress SARS-CoV-2/FLU/RSV testing.  Fact Sheet for Patients: EntrepreneurPulse.com.au  Fact Sheet for Healthcare Providers: IncredibleEmployment.be  This test is not yet approved or cleared by the [place] FDA and has been authorized for detection and/or diagnosis of SARS-CoV-2 by FDA under an Emergency Use Authorization (EUA). This EUA will remain in effect (meaning this test can be used) for the duration of the COVID-19 declaration under Section 564(b)(1) of the Act, 21 U.S.C. section 360bbb-3(b)(1), unless the authorization is terminated or revoked.  Performed at [facility], [place] [address], [place], [place] [address]   Blood Culture ID Panel (Reflexed)     Status: Abnormal   Collection Time: 05/31/20  3:48 PM  Result Value Ref Range Status   Enterococcus faecalis NOT DETECTED NOT DETECTED Final   Enterococcus Faecium NOT DETECTED NOT DETECTED Final   Listeria monocytogenes NOT DETECTED NOT DETECTED Final   Staphylococcus species NOT DETECTED NOT DETECTED Final   Staphylococcus aureus (BCID) NOT DETECTED NOT DETECTED Final   Staphylococcus epidermidis NOT DETECTED NOT DETECTED Final   Staphylococcus lugdunensis NOT DETECTED NOT DETECTED Final   Streptococcus species NOT DETECTED NOT DETECTED Final   Streptococcus agalactiae NOT DETECTED NOT DETECTED Final   Streptococcus pneumoniae NOT DETECTED NOT DETECTED  Final   Streptococcus pyogenes NOT DETECTED NOT DETECTED Final   A.calcoaceticus-baumannii NOT DETECTED NOT DETECTED Final   Bacteroides fragilis NOT DETECTED NOT DETECTED Final   Enterobacterales DETECTED (A) NOT DETECTED Final    Comment: Enterobacterales represent a large order of gram negative bacteria, not a single organism. CRITICAL RESULT CALLED TO, READ BACK BY AND VERIFIED WITH: PHARMD J.LEGGE AT 1150 ON 06/01/2020 BY T.SAAD.    Enterobacter cloacae complex DETECTED (A) NOT DETECTED Final    Comment: CRITICAL RESULT CALLED TO, READ BACK BY AND VERIFIED WITH: PHARMD J.LEGGE AT 1150 ON 06/01/2020 BY T.SAAD.    Escherichia coli NOT DETECTED NOT DETECTED Final   Klebsiella aerogenes NOT DETECTED NOT DETECTED Final   Klebsiella oxytoca NOT DETECTED NOT DETECTED Final   Klebsiella pneumoniae NOT DETECTED NOT DETECTED Final   Proteus species NOT DETECTED NOT DETECTED Final   Salmonella species NOT DETECTED NOT DETECTED Final   Serratia marcescens NOT DETECTED NOT DETECTED Final   Haemophilus influenzae NOT DETECTED NOT DETECTED Final   Neisseria meningitidis NOT DETECTED NOT DETECTED Final   Pseudomonas aeruginosa NOT DETECTED NOT DETECTED Final   Stenotrophomonas maltophilia NOT DETECTED NOT DETECTED Final   Candida albicans NOT DETECTED NOT DETECTED Final   Candida auris NOT DETECTED NOT DETECTED Final   Candida glabrata NOT DETECTED NOT DETECTED Final   Candida krusei NOT DETECTED NOT DETECTED Final   Candida parapsilosis NOT DETECTED NOT DETECTED Final   Candida tropicalis NOT DETECTED NOT DETECTED Final   Cryptococcus neoformans/gattii NOT DETECTED NOT DETECTED Final   CTX-M ESBL NOT DETECTED NOT DETECTED Final   Carbapenem resistance IMP NOT DETECTED NOT DETECTED Final   Carbapenem resistance KPC NOT DETECTED NOT DETECTED Final   Carbapenem resistance NDM NOT DETECTED NOT DETECTED Final   Carbapenem resist OXA 48 LIKE NOT DETECTED NOT DETECTED Final   Carbapenem resistance  VIM NOT  DETECTED NOT DETECTED Final    Comment: Performed at [facility], [place] [address], [place], [place] [address]  Urine culture     Status: Abnormal   Collection Time: 05/31/20  5:47 PM   Specimen: Urine, Clean Catch  Result Value Ref Range Status   Specimen Description   Final    URINE, CLEAN CATCH Performed at [facility], [address] [place], [place] [address]    Special Requests   Final    NONE Performed at [facility], [place] [address], [place], [place] [address]    Culture MULTIPLE SPECIES PRESENT, SUGGEST RECOLLECTION (A)  Final   Report Status 06/02/2020 FINAL  Final  MRSA PCR Screening     Status: None   Collection Time: 06/02/20  1:13 AM   Specimen: Nasopharyngeal  Result Value Ref Range Status   MRSA by PCR NEGATIVE NEGATIVE Final    Comment:        The GeneXpert MRSA Assay (FDA approved for NASAL specimens only), is one component of a comprehensive MRSA colonization surveillance program. It is not intended to diagnose MRSA infection nor to guide or monitor treatment for MRSA infections. Performed at [facility], [place] [address], [place], [place] [address]   Body fluid culture w Gram Stain     Status: None (Preliminary result)   Collection Time: 06/02/20  6:42 PM   Specimen: BILE; Body Fluid  Result Value Ref Range Status   Specimen Description   Final    BILE Performed at [facility] [place] [address], [place], [place] [address]    Special Requests   Final    Normal Performed at [facility], [place] [address], [place], [place] [address]    Gram Stain   Final    NO WBC SEEN MODERATE GRAM NEGATIVE RODS RARE GRAM POSITIVE COCCI    Culture   Final    MODERATE GRAM NEGATIVE RODS IDENTIFICATION AND SUSCEPTIBILITIES TO FOLLOW Performed at [facility], [place] [address], [place], [place] [address]    Report Status PENDING  Incomplete      Labs: Basic Metabolic Panel: Recent Labs  Lab 05/31/20 1548 05/31/20 2143 06/01/20 0420 06/02/20 0150 06/03/20 0249 06/04/20 0447  NA 137  --  136 140 140 140  K 4.0  --  3.8 4.8 3.8 3.7  CL 100  --  103 113* 111 107  CO2 26  --  _0 GLUCOSE 98  --  109* 95 99 95  BUN 13  --  _1 CREATININE 0.83 0.75 0.76 0.66 0.59 0.62  CALCIUM 9.3  --  8.3* 7.4* 8.3* 8.6*  MG  --   --   --   --  1.9 2.0   Liver Function Tests: Recent Labs  Lab 05/31/20 1548 06/01/20 0420 06/02/20 0150 06/03/20 0249 06/04/20 0447  AST _2 14* 18  ALT _3 ALKPHOS 97 71 54 52 62  BILITOT 0.7 1.0 0.9 0.1* 0.2*  PROT 7.9 6.1* 5.3* 5.4* 6.3*  ALBUMIN 4.2 3.2* 2.5* 2.7* 3.0*   Recent Labs  Lab 05/31/20 1548  LIPASE 43   No results for input(s): AMMONIA in the last 168 hours. CBC: Recent Labs  Lab 05/31/20 1548 05/31/20 2143 06/01/20 0420 06/02/20 0150 06/03/20 0856 06/04/20 0447  WBC 7.8 15.2* 12.7* 6.5 7.0 7.6  NEUTROABS 6.1  --   --  3.4 3.5 3.8  HGB 12.0 10.0* 9.6* 8.6* 9.7* 9.3*  HCT 39.3 32.3* 31.0* 28.7* 33.1* 30.6*  MCV 78.9* 78.6* 77.9* 81.1 81.5 79.5*  PLT 430* 347 296 252 293 311   Cardiac Enzymes: No results for input(s): CKTOTAL, CKMB, CKMBINDEX, TROPONINI in the last 168 hours. BNP: BNP (last 3 results) No results for input(s): BNP in the last 8760 hours.  ProBNP (last 3 results) No results for input(s): PROBNP in the last 8760 hours.  CBG: No results for input(s): GLUCAP in the last 168 hours.     Signed:  Irine Seal MD.  [facility] 06/04/2020, 11:18 AM

## 2020-06-05 LAB — BODY FLUID CULTURE W GRAM STAIN
Gram Stain: NONE SEEN
Special Requests: NORMAL

## 2020-06-07 ENCOUNTER — Other Ambulatory Visit: Payer: Self-pay | Admitting: Surgery

## 2020-06-07 DIAGNOSIS — K839 Disease of biliary tract, unspecified: Secondary | ICD-10-CM

## 2020-06-09 ENCOUNTER — Other Ambulatory Visit: Payer: Medicaid Other

## 2020-06-10 ENCOUNTER — Other Ambulatory Visit: Payer: Self-pay | Admitting: Surgery

## 2020-06-10 ENCOUNTER — Ambulatory Visit (HOSPITAL_COMMUNITY)
Admission: RE | Admit: 2020-06-10 | Discharge: 2020-06-10 | Disposition: A | Discharge status: Home / Self Care | Payer: Medicaid Other | Source: Ambulatory Visit | Attending: Surgery | Admitting: Surgery

## 2020-06-10 ENCOUNTER — Other Ambulatory Visit: Payer: Self-pay

## 2020-06-10 ENCOUNTER — Other Ambulatory Visit (HOSPITAL_COMMUNITY): Payer: Self-pay | Admitting: Surgery

## 2020-06-10 DIAGNOSIS — T888XXA Other specified complications of surgical and medical care, not elsewhere classified, initial encounter: Secondary | ICD-10-CM

## 2020-07-01 NOTE — Progress Notes (Signed)
Attempted to obtain medical history via telephone, unable to reach at this time. I left a voicemail to return pre surgical testing department's phone call.  

## 2020-07-04 ENCOUNTER — Other Ambulatory Visit (HOSPITAL_COMMUNITY)
Admission: RE | Admit: 2020-07-04 | Discharge: 2020-07-04 | Disposition: A | Discharge status: Home / Self Care | Payer: Medicaid Other | Source: Ambulatory Visit | Attending: Gastroenterology | Admitting: Gastroenterology

## 2020-07-04 DIAGNOSIS — Z01812 Encounter for preprocedural laboratory examination: Secondary | ICD-10-CM | POA: Diagnosis not present

## 2020-07-04 DIAGNOSIS — Z20822 Contact with and (suspected) exposure to covid-19: Secondary | ICD-10-CM | POA: Insufficient documentation

## 2020-07-04 LAB — SARS CORONAVIRUS 2 (TAT 6-24 HRS): SARS Coronavirus 2: NEGATIVE

## 2020-07-06 ENCOUNTER — Ambulatory Visit (HOSPITAL_COMMUNITY): Payer: Medicaid Other | Admitting: Anesthesiology

## 2020-07-06 ENCOUNTER — Ambulatory Visit (HOSPITAL_COMMUNITY)
Admission: RE | Admit: 2020-07-06 | Discharge: 2020-07-06 | Disposition: A | Discharge status: Home / Self Care | Payer: Medicaid Other | Attending: Gastroenterology | Admitting: Gastroenterology

## 2020-07-06 ENCOUNTER — Other Ambulatory Visit: Payer: Self-pay

## 2020-07-06 ENCOUNTER — Encounter (HOSPITAL_COMMUNITY)
Admission: RE | Disposition: A | Discharge status: Home / Self Care | Payer: Self-pay | Source: Home / Self Care | Attending: Gastroenterology

## 2020-07-06 ENCOUNTER — Encounter (HOSPITAL_COMMUNITY): Payer: Self-pay | Admitting: Gastroenterology

## 2020-07-06 DIAGNOSIS — R1011 Right upper quadrant pain: Secondary | ICD-10-CM | POA: Insufficient documentation

## 2020-07-06 DIAGNOSIS — K2210 Ulcer of esophagus without bleeding: Secondary | ICD-10-CM | POA: Insufficient documentation

## 2020-07-06 DIAGNOSIS — Z9049 Acquired absence of other specified parts of digestive tract: Secondary | ICD-10-CM | POA: Diagnosis not present

## 2020-07-06 DIAGNOSIS — K449 Diaphragmatic hernia without obstruction or gangrene: Secondary | ICD-10-CM | POA: Insufficient documentation

## 2020-07-06 DIAGNOSIS — K3189 Other diseases of stomach and duodenum: Secondary | ICD-10-CM | POA: Insufficient documentation

## 2020-07-06 HISTORY — PX: ESOPHAGOGASTRODUODENOSCOPY (EGD) WITH PROPOFOL: SHX5813

## 2020-07-06 HISTORY — PX: GASTROINTESTINAL STENT REMOVAL: SHX6384

## 2020-07-06 HISTORY — PX: BIOPSY: SHX5522

## 2020-07-06 SURGERY — ESOPHAGOGASTRODUODENOSCOPY (EGD) WITH PROPOFOL
Anesthesia: Monitor Anesthesia Care

## 2020-07-06 MED ORDER — SODIUM CHLORIDE 0.9 % IV SOLN: INTRAVENOUS | Status: DC

## 2020-07-06 MED ORDER — LIDOCAINE HCL (CARDIAC) PF 100 MG/5ML IV SOSY
PREFILLED_SYRINGE | INTRAVENOUS | Status: DC | PRN
  Administered 2020-07-06: 100 mg via INTRAVENOUS

## 2020-07-06 MED ORDER — MIDAZOLAM HCL 2 MG/2ML IJ SOLN
INTRAMUSCULAR | Status: DC | PRN
  Administered 2020-07-06: 2 mg via INTRAVENOUS

## 2020-07-06 MED ORDER — PANTOPRAZOLE SODIUM 40 MG PO TBEC: 40.0000 mg | DELAYED_RELEASE_TABLET | Freq: Every day | ORAL | 1 refills | Status: AC

## 2020-07-06 MED ORDER — LACTATED RINGERS IV SOLN: INTRAVENOUS | Status: DC

## 2020-07-06 MED ORDER — MIDAZOLAM HCL 2 MG/2ML IJ SOLN
INTRAMUSCULAR | Status: AC
  Filled 2020-07-06: qty 2

## 2020-07-06 MED ORDER — PROPOFOL 500 MG/50ML IV EMUL
INTRAVENOUS | Status: DC | PRN
  Administered 2020-07-06: 150 ug/kg/min via INTRAVENOUS

## 2020-07-06 MED ORDER — PROPOFOL 10 MG/ML IV BOLUS
INTRAVENOUS | Status: DC | PRN
  Administered 2020-07-06 (×2): 20 mg via INTRAVENOUS
  Administered 2020-07-06: 40 mg via INTRAVENOUS
  Administered 2020-07-06 (×2): 20 mg via INTRAVENOUS

## 2020-07-06 SURGICAL SUPPLY — 14 items

## 2020-07-06 NOTE — Anesthesia Preprocedure Evaluation (Addendum)
Anesthesia Evaluation  Patient identified by MRN, date of birth, ID band Patient awake    Reviewed: Allergy & Precautions, NPO status , Patient's Chart, lab work & pertinent test results  History of Anesthesia Complications Negative for: history of anesthetic complications  Airway Mallampati: II  TM Distance: >3 FB Neck ROM: Full    Dental no notable dental hx. (+) Dental Advisory Given   Pulmonary neg pulmonary ROS,    Pulmonary exam normal        Cardiovascular Exercise Tolerance: Good negative cardio ROS Normal cardiovascular exam     Neuro/Psych Depression negative neurological ROS     GI/Hepatic negative GI ROS, Neg liver ROS,   Endo/Other  negative endocrine ROS  Renal/GU negative Renal ROS     Musculoskeletal negative musculoskeletal ROS (+)   Abdominal (+) + obese,   Peds  Hematology  (+) anemia , Lab Results      Component                Value               Date                      WBC                      10.0                05/23/2020                HGB                      9.6 (L)             05/23/2020                HCT                      31.7 (L)            05/23/2020                MCV                      81.1                05/23/2020                PLT                      270                 05/23/2020              Anesthesia Other Findings   Reproductive/Obstetrics negative OB ROS                            Anesthesia Physical  Anesthesia Plan  ASA: 2  Anesthesia Plan: MAC   Post-op Pain Management:    Induction: Intravenous  PONV Risk Score and Plan: 2 and Treatment may vary due to age or medical condition, Midazolam, Ondansetron and Propofol infusion  Airway Management Planned: Natural Airway, Simple Face Mask and Nasal Cannula  Additional Equipment: None  Intra-op Plan:   Post-operative Plan:   Informed Consent: I have reviewed the patients  History and Physical, chart, labs and discussed the procedure including the  risks, benefits and alternatives for the proposed anesthesia with the patient or authorized representative who has indicated his/her understanding and acceptance.     Dental advisory given  Plan Discussed with: Anesthesiologist and CRNA  Anesthesia Plan Comments:        Anesthesia Quick Evaluation

## 2020-07-06 NOTE — Transfer of Care (Signed)
Immediate Anesthesia Transfer of Care Note  Patient: Genny Caulder  Procedure(s) Performed: ESOPHAGOGASTRODUODENOSCOPY (EGD) WITH PROPOFOL GASTROINTESTINAL STENT REMOVAL BIOPSY  Patient Location: Endoscopy Unit  Anesthesia Type:MAC  Level of Consciousness: drowsy and patient cooperative  Airway & Oxygen Therapy: Patient Spontanous Breathing and Patient connected to face mask oxygen  Post-op Assessment: Report given to RN and Post -op Vital signs reviewed and stable  Post vital signs: Reviewed and stable  Last Vitals:  Vitals Value Taken Time  BP 92/46 1311  Temp    Pulse 64 07/06/20 1313  Resp 15 07/06/20 1313  SpO2 100 % 07/06/20 1313  Vitals shown include unvalidated device data.  Last Pain:  Vitals:   07/06/20 1156  TempSrc: Oral  PainSc: 3       Patients Stated Pain Goal: 3 (82/80/03 4917)  Complications: No notable events documented.

## 2020-07-06 NOTE — Discharge Instructions (Signed)
YOU SHOULD EXPECT: Some feelings of bloating in the abdomen. Passage of more gas than usual. Walking can help get rid of the air that was put into your GI tract during the procedure and reduce the bloating.   DIET: Your first meal following the procedure should be a light meal and then it is ok to progress to your normal diet. A half-sandwich or bowl of soup is an example of a good first meal. Heavy or fried foods are harder to digest and may make you feel nauseous or bloated. Drink plenty of fluids but you should avoid alcoholic beverages for 24 hours.   ACTIVITY: Your care partner should take you home directly after the procedure. You should plan to take it easy, moving slowly for the rest of the day. You can resume normal activity the day after the procedure however YOU SHOULD NOT DRIVE, use power tools, machinery or perform tasks that involve climbing or major physical exertion for 24 hours (because of the sedation medicines used during the test).   SYMPTOMS TO REPORT IMMEDIATELY: A gastroenterologist can be reached at any hour. Please call [phone]  for any of the following symptoms:  Following upper endoscopy (EGD, EUS, ERCP, esophageal dilation) Vomiting of blood or coffee ground material  New, significant abdominal pain  New, significant chest pain or pain under the shoulder blades  Painful or persistently difficult swallowing  New shortness of breath  Black, tarry-looking or red, bloody stools  FOLLOW UP:  If any biopsies were taken you will be contacted by phone or by letter within the next 1-3 weeks. Call [phone]  if you have not heard about the biopsies in 3 weeks.  Please also call with any specific questions about appointments or follow up tests.  

## 2020-07-06 NOTE — Op Note (Signed)
[facility] Patient Name: Jordan Chung Procedure Date: 07/06/2020 MRN: [id number] Attending MD: Kerin Salen , MD Date of Birth: [birth year] CSN: [id number] Age: 30 Admit Type: Outpatient Procedure:                Upper GI endoscopy Indications:              Abdominal pain in the right upper quadrant, Biliary                            stent removal Providers:                Kerin Salen, MD, Estella Husk RN, RN, Edwina                            Hickman, Arlee Muslim Tech., Technician Referring MD:             [facility] Medicines:                Monitored Anesthesia Care Complications:            No immediate complications. Estimated blood loss:                            Minimal. Estimated Blood Loss:     Estimated blood loss was minimal. Procedure:                Pre-Anesthesia Assessment:                           - Prior to the procedure, a History and Physical                            was performed, and patient medications and                            allergies were reviewed. The patient's tolerance of                            previous anesthesia was also reviewed. The risks                            and benefits of the procedure and the sedation                            options and risks were discussed with the patient.                            All questions were answered, and informed consent                            was obtained. Prior Anticoagulants: The patient has                            taken no previous anticoagulant or antiplatelet  agents. ASA Grade Assessment: II - A patient with                            mild systemic disease. After reviewing the risks                            and benefits, the patient was deemed in                            satisfactory condition to undergo the procedure.                           After obtaining informed consent, the endoscope was                             passed under direct vision. Throughout the                            procedure, the patient's blood pressure, pulse, and                            oxygen saturations were monitored continuously. The                            GIF-H190 ([id number]) Olympus gastroscope was                            introduced through the mouth, and advanced to the                            second part of duodenum. The upper GI endoscopy was                            accomplished without difficulty. The patient                            tolerated the procedure well. Scope In: Scope Out: Findings:      The upper third of the esophagus and middle third of the esophagus were       normal.      Two superficial esophageal ulcers with no bleeding and no stigmata of       recent bleeding were found 34 to 35 cm from the incisors. The largest       lesion was 6 mm in largest dimension. Biopsies were taken with a cold       forceps for histology.      Mildly erythematous mucosa without bleeding was found in the gastric       body and in the gastric antrum. Biopsies were taken with a cold forceps       for Helicobacter pylori testing.      The cardia and gastric fundus were normal on retroflexion.      A previously placed plastic biliary stent was seen in the second portion       of the duodenum. Stent removal was accomplished with a snare.      A  small hiatal hernia was present. Impression:               - The ampulla appeared unremarkable post stent                            removal.                           - Normal upper third of esophagus and middle third                            of esophagus.                           - Esophageal ulcers with no bleeding and no                            stigmata of recent bleeding. Biopsied.                           - Erythematous mucosa in the gastric body and                            antrum. Biopsied.                           - Plastic stent in the duodenum.  Removed.                           - Small hiatal hernia. Moderate Sedation:      Patient did not receive moderate sedation for this procedure, but       instead received monitored anesthesia care. Recommendation:           - Patient has a contact number available for                            emergencies. The signs and symptoms of potential                            delayed complications were discussed with the                            patient. Return to normal activities tomorrow.                            Written discharge instructions were provided to the                            patient.                           - Resume regular diet.                           - Continue present medications.                           -  Await pathology results.                           - Use Protonix (pantoprazole) 40 mg PO daily for 2                            months. Procedure Code(s):        --- Professional ---                           [phone], Esophagogastroduodenoscopy, flexible,                            transoral; with removal of foreign body(s)                           43239, Esophagogastroduodenoscopy, flexible,                            transoral; with biopsy, single or multiple Diagnosis Code(s):        --- Professional ---                           K22.10, Ulcer of esophagus without bleeding                           K31.89, Other diseases of stomach and duodenum                           Z46.59, Encounter for fitting and adjustment of                            other gastrointestinal appliance and device                           K44.9, Diaphragmatic hernia without obstruction or                            gangrene                           R10.11, Right upper quadrant pain CPT copyright 2019 [employer]. All rights reserved. The codes documented in this report are preliminary and upon coder review may  be revised to meet current compliance  requirements. Kerin SalenArya Kizer Nobbe, MD 07/06/2020 1:17:51 PM This report has been signed electronically. Number of Addenda: 0

## 2020-07-06 NOTE — Anesthesia Postprocedure Evaluation (Signed)
Anesthesia Post Note  Patient: Emonnie Cannady  Procedure(s) Performed: ESOPHAGOGASTRODUODENOSCOPY (EGD) WITH PROPOFOL GASTROINTESTINAL STENT REMOVAL BIOPSY     Patient location during evaluation: Endoscopy Anesthesia Type: MAC Level of consciousness: awake and alert Pain management: pain level controlled Vital Signs Assessment: post-procedure vital signs reviewed and stable Respiratory status: spontaneous breathing, nonlabored ventilation, respiratory function stable and patient connected to nasal cannula oxygen Cardiovascular status: blood pressure returned to baseline and stable Postop Assessment: no apparent nausea or vomiting Anesthetic complications: no   No notable events documented.  Last Vitals:  Vitals:   07/06/20 1330 07/06/20 1335  BP: 110/66 115/63  Pulse: 64 60  Resp: 14 16  Temp:    SpO2: 97% 98%    Last Pain:  Vitals:   07/06/20 1330  TempSrc:   PainSc: 0-No pain                 Jeffrie Lofstrom DANIEL

## 2020-07-06 NOTE — H&P (Addendum)
HPI: 30/female with CBD stent placed in 5/22 for bile leak post cholecystectomy. Bile drain was removed about 3 weeks ago. Patient continues to have burning in right upper quadrant, currently pain is 3 out of 10.   Current Medications  None   Past Medical History  Abdominal pain.   Vaginal candidiasis.   Leukocytosis.   Persistent biliary leak.   Anemia.   Laparoscopic cholecystectomy.    Surgical History  ERCP 05/2020  Cholecystectomy 05/2020   Family History  Father: alive  Mother: alive  Negative family hx of colon ca, liver disease Polyps on father side.   Social History  General:  Tobacco use  cigarettes: Never smoked no Alcohol.  no Recreational drug use.    Hospitalization/Major Diagnostic Procedure  Abd pain; Bile leak from [facility] 05/2020   Review of Systems  GI PROCEDURE:  Pacemaker/ AICD no. Artificial heart valves no. MI/heart attack no. Abnormal heart rhythm no. Angina no. CVA no. Hypertension no. Hypotension no. Asthma, COPD no. Sleep apnea no. Seizure disorders no. Artificial joints no. Severe DJD no. Diabetes no. Significant headaches no. Vertigo no. Depression/anxiety YES. Abnormal bleeding no. Kidney Disease no. Liver disease no. Chance of pregnancy no. Blood transfusion no.    Vital Signs  Wt 224, Ht 69, BMI 33.08.  Blood pressure 113/51 Heart rate 71 Respiratory rate 18 Saturation 100% on room air  General:Not in distress No pallor, no icterus Abdomen: Nondistended Extremities: No deformity Neuro: Alert, awake, oriented x3   Assessments     1. Encounter for adjustment or removal of myringotomy device (stent) (tube) - Z45.82   Treatment  1. Encounter for adjustment or removal of stent  IMAGING: EGD  The risks and the benefits of the procedure were discussed with the patient in details. She understands and verbalizes consent.

## 2020-07-07 ENCOUNTER — Encounter (HOSPITAL_COMMUNITY): Payer: Self-pay | Admitting: Gastroenterology

## 2020-07-08 LAB — SURGICAL PATHOLOGY

## 2020-08-30 ENCOUNTER — Encounter (HOSPITAL_BASED_OUTPATIENT_CLINIC_OR_DEPARTMENT_OTHER): Payer: Self-pay | Admitting: Emergency Medicine

## 2020-08-30 ENCOUNTER — Emergency Department (HOSPITAL_BASED_OUTPATIENT_CLINIC_OR_DEPARTMENT_OTHER)
Admission: EM | Admit: 2020-08-30 | Discharge: 2020-08-30 | Disposition: A | Discharge status: Home / Self Care | Payer: Medicaid Other | Attending: Emergency Medicine | Admitting: Emergency Medicine

## 2020-08-30 ENCOUNTER — Emergency Department (HOSPITAL_BASED_OUTPATIENT_CLINIC_OR_DEPARTMENT_OTHER): Payer: Medicaid Other

## 2020-08-30 ENCOUNTER — Other Ambulatory Visit: Payer: Self-pay

## 2020-08-30 DIAGNOSIS — R10A2 Flank pain, left side: Secondary | ICD-10-CM

## 2020-08-30 DIAGNOSIS — R109 Unspecified abdominal pain: Secondary | ICD-10-CM | POA: Insufficient documentation

## 2020-08-30 LAB — CBC WITH DIFFERENTIAL/PLATELET
Abs Immature Granulocytes: 0.03 10*3/uL (ref 0.00–0.07)
Basophils Absolute: 0 10*3/uL (ref 0.0–0.1)
Basophils Relative: 0 %
Eosinophils Absolute: 0.1 10*3/uL (ref 0.0–0.5)
Eosinophils Relative: 2 %
HCT: 33.5 % — ABNORMAL LOW (ref 36.0–46.0)
Hemoglobin: 10.2 g/dL — ABNORMAL LOW (ref 12.0–15.0)
Immature Granulocytes: 0 %
Lymphocytes Relative: 38 %
Lymphs Abs: 3.5 10*3/uL (ref 0.7–4.0)
MCH: 22.2 pg — ABNORMAL LOW (ref 26.0–34.0)
MCHC: 30.4 g/dL (ref 30.0–36.0)
MCV: 72.8 fL — ABNORMAL LOW (ref 80.0–100.0)
Monocytes Absolute: 0.6 10*3/uL (ref 0.1–1.0)
Monocytes Relative: 6 %
Neutro Abs: 4.8 10*3/uL (ref 1.7–7.7)
Neutrophils Relative %: 54 %
Platelets: 396 10*3/uL (ref 150–400)
RBC: 4.6 MIL/uL (ref 3.87–5.11)
RDW: 16.6 % — ABNORMAL HIGH (ref 11.5–15.5)
WBC: 9.1 10*3/uL (ref 4.0–10.5)
nRBC: 0 % (ref 0.0–0.2)

## 2020-08-30 LAB — COMPREHENSIVE METABOLIC PANEL
ALT: 17 U/L (ref 0–44)
AST: 25 U/L (ref 15–41)
Albumin: 3.9 g/dL (ref 3.5–5.0)
Alkaline Phosphatase: 73 U/L (ref 38–126)
Anion gap: 7 (ref 5–15)
BUN: 16 mg/dL (ref 6–20)
CO2: 23 mmol/L (ref 22–32)
Calcium: 9 mg/dL (ref 8.9–10.3)
Chloride: 108 mmol/L (ref 98–111)
Creatinine, Ser: 0.81 mg/dL (ref 0.44–1.00)
GFR, Estimated: >60 mL/min (ref >60)
Glucose, Bld: 80 mg/dL (ref 70–99)
Potassium: 4.4 mmol/L (ref 3.5–5.1)
Sodium: 138 mmol/L (ref 135–145)
Total Bilirubin: 0.4 mg/dL (ref 0.3–1.2)
Total Protein: 7.3 g/dL (ref 6.5–8.1)

## 2020-08-30 LAB — URINALYSIS, ROUTINE W REFLEX MICROSCOPIC
Bilirubin Urine: NEGATIVE
Glucose, UA: NEGATIVE mg/dL
Ketones, ur: NEGATIVE mg/dL
Leukocytes,Ua: NEGATIVE
Nitrite: NEGATIVE
Protein, ur: NEGATIVE mg/dL
Specific Gravity, Urine: 1.02 (ref 1.005–1.030)
pH: 6 (ref 5.0–8.0)

## 2020-08-30 LAB — URINALYSIS, MICROSCOPIC (REFLEX)

## 2020-08-30 LAB — PREGNANCY, URINE: Preg Test, Ur: NEGATIVE

## 2020-08-30 LAB — LIPASE, BLOOD: Lipase: 35 U/L (ref 11–51)

## 2020-08-30 MED ORDER — SODIUM CHLORIDE 0.9 % IV BOLUS
1000.0000 mL | Freq: Once | INTRAVENOUS | Status: AC
  Administered 2020-08-30: 1000 mL via INTRAVENOUS

## 2020-08-30 MED ORDER — MORPHINE SULFATE (PF) 4 MG/ML IV SOLN
4.0000 mg | Freq: Once | INTRAVENOUS | Status: AC
  Administered 2020-08-30: 4 mg via INTRAVENOUS
  Filled 2020-08-30: qty 1

## 2020-08-30 MED ORDER — OXYCODONE HCL 5 MG PO TABS
5.0000 mg | ORAL_TABLET | Freq: Once | ORAL | Status: AC
  Administered 2020-08-30: 5 mg via ORAL
  Filled 2020-08-30: qty 1

## 2020-08-30 MED ORDER — ONDANSETRON HCL 4 MG/2ML IJ SOLN
4.0000 mg | Freq: Once | INTRAMUSCULAR | Status: AC
  Administered 2020-08-30: 4 mg via INTRAVENOUS
  Filled 2020-08-30: qty 2

## 2020-08-30 MED ORDER — ACETAMINOPHEN 500 MG PO TABS
1000.0000 mg | ORAL_TABLET | Freq: Once | ORAL | Status: AC
  Administered 2020-08-30: 1000 mg via ORAL
  Filled 2020-08-30: qty 2

## 2020-08-30 MED ORDER — KETOROLAC TROMETHAMINE 15 MG/ML IJ SOLN
15.0000 mg | Freq: Once | INTRAMUSCULAR | Status: AC
  Administered 2020-08-30: 15 mg via INTRAVENOUS
  Filled 2020-08-30: qty 1

## 2020-08-30 NOTE — ED Triage Notes (Signed)
Pt is c/o left flank pain that started this morning on her way to work  Pt describes the pain as sharp  Pt has hx of kidney stones

## 2020-08-30 NOTE — Discharge Instructions (Addendum)
suspect musculoskeletal type pain or may be a recently passed kidney stone.  Recommend continued use of ibuprofen and Tylenol for pain.

## 2020-08-30 NOTE — ED Provider Notes (Signed)
[facility] EMERGENCY DEPARTMENT Provider Note   CSN: [id number] Arrival date & time: 08/30/20  7253     History Chief Complaint  Patient presents with   Flank Pain    Jordan Chung is a 30 y.o. female.  History of kidney stones and here with left flank pain.  No hematuria.  No pain with urination.  Pain started this morning.  No nausea or vomiting.  The history is provided by the patient.  Flank Pain This is a new problem. The current episode started 3 to 5 hours ago. The problem occurs constantly. The problem has not changed since onset.Associated symptoms include abdominal pain (left flank pain). Pertinent negatives include no chest pain, no headaches and no shortness of breath. Nothing aggravates the symptoms. Nothing relieves the symptoms. She has tried nothing for the symptoms.      Past Medical History:  Diagnosis Date   Hyperemesis gravidarum    Postpartum depression     Patient Active Problem List   Diagnosis Date Noted   Bacteremia due to Enterobacter species    Sepsis (HCC) 06/01/2020   Bacteremia    Vaginal candidiasis    Severe sepsis (HCC) 05/31/2020   Bile leak 05/23/2020   Abdominal pain 05/22/2020   Acute pancreatitis 05/22/2020   Post-ERCP acute pancreatitis 05/22/2020   Anemia 05/22/2020   SOB (shortness of breath) 05/22/2020   Leukocytosis 05/22/2020   S/P laparoscopic cholecystectomy 05/22/2020   Post-op pain 05/13/2020    Past Surgical History:  Procedure Laterality Date   BILIARY STENT PLACEMENT N/A 05/15/2020   Procedure: BILIARY STENT PLACEMENT;  Surgeon: Kerin Salen, MD;  Location: [facility];  Service: Gastroenterology;  Laterality: N/A;   BILIARY STENT PLACEMENT N/A 05/24/2020   Procedure: BILIARY STENT PLACEMENT;  Surgeon: Willis Modena, MD;  Location: [facility];  Service: Endoscopy;  Laterality: N/A;   BIOPSY  07/06/2020   Procedure: BIOPSY;  Surgeon: Kerin Salen, MD;  Location: [facility];  Service: Gastroenterology;;    CHOLECYSTECTOMY N/A 05/09/2020   Procedure: LAPAROSCOPIC CHOLECYSTECTOMY WITH INTRAOPERATIVE CHOLANGIOGRAM;  Surgeon: Abigail Miyamoto, MD;  Location: [facility];  Service: General;  Laterality: N/A;   ENDOSCOPIC RETROGRADE CHOLANGIOPANCREATOGRAPHY (ERCP) WITH PROPOFOL N/A 05/15/2020   Procedure: ENDOSCOPIC RETROGRADE CHOLANGIOPANCREATOGRAPHY (ERCP) WITH PROPOFOL;  Surgeon: Kerin Salen, MD;  Location: [facility];  Service: Gastroenterology;  Laterality: N/A;   ERCP N/A 05/24/2020   Procedure: ENDOSCOPIC RETROGRADE CHOLANGIOPANCREATOGRAPHY (ERCP);  Surgeon: Willis Modena, MD;  Location: [facility];  Service: Endoscopy;  Laterality: N/A;   ESOPHAGOGASTRODUODENOSCOPY (EGD) WITH PROPOFOL N/A 07/06/2020   Procedure: ESOPHAGOGASTRODUODENOSCOPY (EGD) WITH PROPOFOL;  Surgeon: Kerin Salen, MD;  Location: [facility];  Service: Gastroenterology;  Laterality: N/A;   GASTROINTESTINAL STENT REMOVAL N/A 07/06/2020   Procedure: GASTROINTESTINAL STENT REMOVAL;  Surgeon: Kerin Salen, MD;  Location: [facility];  Service: Gastroenterology;  Laterality: N/A;  removal of CBD and PD stent    IR RADIOLOGIST EVAL & MGMT  05/31/2020   PANCREATIC STENT PLACEMENT  05/15/2020   Procedure: PANCREATIC STENT PLACEMENT;  Surgeon: Kerin Salen, MD;  Location: [facility];  Service: Gastroenterology;;   [name]  05/15/2020   Procedure: [name];  Surgeon: Kerin Salen, MD;  Location: [facility];  Service: Gastroenterology;;   Francine Graven REMOVAL  05/24/2020   Procedure: STENT REMOVAL;  Surgeon: Willis Modena, MD;  Location: [facility];  Service: Endoscopy;;     OB History     Gravida  2   Para  1   Term  1   Preterm  0  AB  0   Living  1      SAB  0   IAB  0   Ectopic  0   Multiple  0   Live Births  1           Family History  Problem Relation Age of Onset   Diabetes Mother    Depression Mother    Hyperlipidemia Father    Hypertension Father    Cancer Paternal Grandfather     Social  History   Tobacco Use   Smoking status: Never   Smokeless tobacco: Never  Vaping Use   Vaping Use: Never used  Substance Use Topics   Alcohol use: Never   Drug use: Never    Home Medications Prior to Admission medications   Medication Sig Start Date End Date Taking? Authorizing Provider  calcium carbonate (TUMS - DOSED IN MG ELEMENTAL CALCIUM) 500 MG chewable tablet Chew 1,000 mg by mouth daily as needed for indigestion or heartburn.    [provider]  Multiple Vitamin (MULTIVITAMIN WITH MINERALS) TABS tablet Take 1 tablet by mouth daily.    [provider]  pantoprazole (PROTONIX) 40 MG tablet Take 1 tablet (40 mg total) by mouth daily. 06/04/20 06/04/21  Rodolph Bonghompson, Daniel V, MD  pantoprazole (PROTONIX) 40 MG tablet Take 1 tablet (40 mg total) by mouth daily. 07/06/20 09/04/20  Kerin SalenKarki, Arya, MD  dicyclomine (BENTYL) 20 MG tablet Take 1 tablet (20 mg total) by mouth 2 (two) times daily. 03/27/19 10/06/19  Lorelee NewGreen, Garrett L, PA-C    Allergies    Patient has no known allergies.  Review of Systems   Review of Systems  Constitutional:  Negative for chills and fever.  HENT:  Negative for ear pain and sore throat.   Eyes:  Negative for pain and visual disturbance.  Respiratory:  Negative for cough and shortness of breath.   Cardiovascular:  Negative for chest pain and palpitations.  Gastrointestinal:  Positive for abdominal pain (left flank pain). Negative for vomiting.  Genitourinary:  Positive for flank pain. Negative for dysuria and hematuria.  Musculoskeletal:  Negative for arthralgias and back pain.  Skin:  Negative for color change and rash.  Neurological:  Negative for seizures, syncope and headaches.  All other systems reviewed and are negative.  Physical Exam Updated Vital Signs BP (!) 123/54 (BP Location: Right Arm)   Pulse 66   Temp 98.1 F (36.7 C) (Oral)   Resp 20   Ht 5\' 8"  (1.727 m)   Wt 113.4 kg   SpO2 99%   BMI 38.01 kg/m   Physical  Exam Vitals and nursing note reviewed.  Constitutional:      General: She is in acute distress.     Appearance: She is well-developed.  HENT:     Head: Normocephalic and atraumatic.  Eyes:     Extraocular Movements: Extraocular movements intact.     Conjunctiva/sclera: Conjunctivae normal.     Pupils: Pupils are equal, round, and reactive to light.  Cardiovascular:     Rate and Rhythm: Normal rate and regular rhythm.     Pulses: Normal pulses.     Heart sounds: Normal heart sounds. No murmur heard. Pulmonary:     Effort: Pulmonary effort is normal. No respiratory distress.     Breath sounds: Normal breath sounds.  Abdominal:     Palpations: Abdomen is soft.     Tenderness: There is no abdominal tenderness. There is left CVA tenderness.  Musculoskeletal:  Cervical back: Neck supple.  Skin:    General: Skin is warm and dry.     Capillary Refill: Capillary refill takes less than 2 seconds.  Neurological:     General: No focal deficit present.     Mental Status: She is alert.    ED Results / Procedures / Treatments   Labs (all labs ordered are listed, but only abnormal results are displayed) Labs Reviewed  CBC WITH DIFFERENTIAL/PLATELET - Abnormal; Notable for the following components:      Result Value   Hemoglobin 10.2 (*)    HCT 33.5 (*)    MCV 72.8 (*)    MCH 22.2 (*)    RDW 16.6 (*)    All other components within normal limits  URINALYSIS, ROUTINE W REFLEX MICROSCOPIC - Abnormal; Notable for the following components:   Hgb urine dipstick SMALL (*)    All other components within normal limits  URINALYSIS, MICROSCOPIC (REFLEX) - Abnormal; Notable for the following components:   Bacteria, UA FEW (*)    All other components within normal limits  COMPREHENSIVE METABOLIC PANEL  LIPASE, BLOOD  PREGNANCY, URINE    EKG None  Radiology CT Renal Stone Study  Result Date: 08/30/2020 CLINICAL DATA:  LEFT flank pain. EXAM: CT ABDOMEN AND PELVIS WITHOUT CONTRAST  TECHNIQUE: Multidetector CT imaging of the abdomen and pelvis was performed following the standard protocol without IV contrast. COMPARISON:  CT Abdomen Pelvis, 05/31/2020. Abdominal ultrasound, 06/10/2020. FINDINGS: Lower chest: No acute abnormality. Hepatobiliary: No focal liver abnormality is seen. Status post cholecystectomy. No biliary dilatation. Pancreas: Unremarkable. No pancreatic ductal dilatation or surrounding inflammatory changes. Spleen: Normal in size without focal abnormality. Adrenals/Urinary Tract: Adrenal glands are unremarkable. Punctate (sub-3 mm) calculi within the bilateral renal collecting systems. No hydronephrosis. Bladder is unremarkable. Stomach/Bowel: Small hiatus hernia. Appendix appears normal. No evidence of bowel wall thickening, distention, or inflammatory changes. Vascular/Lymphatic: No significant vascular findings are present. No enlarged abdominal or pelvic lymph nodes. Reproductive: IUD. Small pelvic phleboliths. Uterus and bilateral adnexa are unremarkable. Other: No abdominal wall hernia or abnormality. No abdominopelvic ascites. Musculoskeletal: No acute or significant osseous findings. IMPRESSION: Punctate, bilateral nonobstructing renal calculi. No hydronephrosis. Electronically Signed   By: Roanna Banning M.D.   On: 08/30/2020 08:44    Procedures Procedures   Medications Ordered in ED Medications  sodium chloride 0.9 % bolus 1,000 mL (has no administration in time range)  ondansetron (ZOFRAN) injection 4 mg (4 mg Intravenous Given 08/30/20 0738)  morphine 4 MG/ML injection 4 mg (4 mg Intravenous Given 08/30/20 0744)    ED Course  I have reviewed the triage vital signs and the nursing notes.  Pertinent labs & imaging results that were available during my care of the patient were reviewed by me and considered in my medical decision making (see chart for details).    MDM Rules/Calculators/A&P                           Labrenda Lasky is here with left flank  pain.  Normal vitals.  No fever.  History of kidney stones.  Suspect kidney stones versus pyelonephritis versus muscular process versus gastritis.  Denies any chest pain or shortness of breath.  No hematuria.  We will get basic labs including CT scan of abdomen and pelvis.  Pregnancy test negative.  Will give IV fluids, IV Zofran, IV morphine and reevaluate.  No significant anemia, electrolyte abnormality, kidney injury.  Urinalysis negative for  infection.  CT scan shows no bowel obstruction or obstructive kidney stone and overall CT scan is unremarkable.  Suspect passed kidney stone versus MSK pain.  Discharged in good condition.  This chart was dictated using voice recognition software.  Despite best efforts to proofread,  errors can occur which can change the documentation meaning.   Final Clinical Impression(s) / ED Diagnoses Final diagnoses:  Left flank pain    Rx / DC Orders ED Discharge Orders     None        Virgina Norfolk, DO 08/30/20 [phone]

## 2021-04-26 ENCOUNTER — Emergency Department (HOSPITAL_BASED_OUTPATIENT_CLINIC_OR_DEPARTMENT_OTHER): Payer: Medicaid Other

## 2021-04-26 ENCOUNTER — Emergency Department (HOSPITAL_BASED_OUTPATIENT_CLINIC_OR_DEPARTMENT_OTHER)
Admission: EM | Admit: 2021-04-26 | Discharge: 2021-04-27 | Disposition: A | Discharge status: Home / Self Care | Payer: Medicaid Other | Attending: Emergency Medicine | Admitting: Emergency Medicine

## 2021-04-26 ENCOUNTER — Encounter (HOSPITAL_BASED_OUTPATIENT_CLINIC_OR_DEPARTMENT_OTHER): Payer: Self-pay | Admitting: Emergency Medicine

## 2021-04-26 ENCOUNTER — Other Ambulatory Visit: Payer: Self-pay

## 2021-04-26 ENCOUNTER — Emergency Department (HOSPITAL_COMMUNITY): Payer: Medicaid Other

## 2021-04-26 DIAGNOSIS — N83201 Unspecified ovarian cyst, right side: Secondary | ICD-10-CM | POA: Diagnosis not present

## 2021-04-26 DIAGNOSIS — R1031 Right lower quadrant pain: Secondary | ICD-10-CM | POA: Diagnosis present

## 2021-04-26 DIAGNOSIS — O26899 Other specified pregnancy related conditions, unspecified trimester: Secondary | ICD-10-CM

## 2021-04-26 LAB — CBC WITH DIFFERENTIAL/PLATELET
Abs Immature Granulocytes: 0.01 10*3/uL (ref 0.00–0.07)
Basophils Absolute: 0 10*3/uL (ref 0.0–0.1)
Basophils Relative: 0 %
Eosinophils Absolute: 0.1 10*3/uL (ref 0.0–0.5)
Eosinophils Relative: 1 %
HCT: 33.2 % — ABNORMAL LOW (ref 36.0–46.0)
Hemoglobin: 9.8 g/dL — ABNORMAL LOW (ref 12.0–15.0)
Immature Granulocytes: 0 %
Lymphocytes Relative: 40 %
Lymphs Abs: 2.8 10*3/uL (ref 0.7–4.0)
MCH: 20 pg — ABNORMAL LOW (ref 26.0–34.0)
MCHC: 29.5 g/dL — ABNORMAL LOW (ref 30.0–36.0)
MCV: 67.6 fL — ABNORMAL LOW (ref 80.0–100.0)
Monocytes Absolute: 0.7 10*3/uL (ref 0.1–1.0)
Monocytes Relative: 9 %
Neutro Abs: 3.5 10*3/uL (ref 1.7–7.7)
Neutrophils Relative %: 50 %
Platelets: 336 10*3/uL (ref 150–400)
RBC: 4.91 MIL/uL (ref 3.87–5.11)
RDW: 18.2 % — ABNORMAL HIGH (ref 11.5–15.5)
WBC: 7.1 10*3/uL (ref 4.0–10.5)
nRBC: 0 % (ref 0.0–0.2)

## 2021-04-26 LAB — BASIC METABOLIC PANEL
Anion gap: 9 (ref 5–15)
BUN: 12 mg/dL (ref 6–20)
CO2: 23 mmol/L (ref 22–32)
Calcium: 9.1 mg/dL (ref 8.9–10.3)
Chloride: 105 mmol/L (ref 98–111)
Creatinine, Ser: 0.97 mg/dL (ref 0.44–1.00)
GFR, Estimated: >60 mL/min (ref >60)
Glucose, Bld: 96 mg/dL (ref 70–99)
Potassium: 3.5 mmol/L (ref 3.5–5.1)
Sodium: 137 mmol/L (ref 135–145)

## 2021-04-26 LAB — HCG, QUANTITATIVE, PREGNANCY: hCG, Beta Chain, Quant, S: 108 m[IU]/mL — ABNORMAL HIGH (ref <5)

## 2021-04-26 MED ORDER — ACETAMINOPHEN 325 MG PO TABS
650.0000 mg | ORAL_TABLET | Freq: Once | ORAL | Status: AC
  Administered 2021-04-26: 650 mg via ORAL
  Filled 2021-04-26: qty 2

## 2021-04-26 MED ORDER — ONDANSETRON HCL 4 MG/2ML IJ SOLN
4.0000 mg | Freq: Once | INTRAMUSCULAR | Status: AC
  Administered 2021-04-26: 4 mg via INTRAVENOUS
  Filled 2021-04-26: qty 2

## 2021-04-26 NOTE — ED Notes (Signed)
Nausea has improved but pain is still the same. Patient is requesting pain medication.  ?

## 2021-04-26 NOTE — ED Notes (Signed)
Ultrasound at bedside

## 2021-04-26 NOTE — ED Provider Notes (Signed)
?[facility] EMERGENCY DEPARTMENT ?Provider Note ? ? ?CSN: [id number] ?Arrival date & time: 04/26/21  2017 ? ?  ? ?History ? ?Chief Complaint  ?Patient presents with  ? Abdominal Pain  ? ? ?Jordan Chung is a 31 y.o. female. ? ?31 year old presents to the ED with complaint of abdominal pain with nausea and vomiting. Patient found out on Tuesday that she is pregnant, despite having an IUD. Positive urine pregnancy, low quantitative HCG at [facility] office. Ultrasound did reveal hemorrhagic ovarian cyst. Pain has intensified today. Taking ibuprofen without relief. ? ?The history is provided by the patient and medical records.  ?Abdominal Pain ?Pain location:  Suprapubic and RLQ ?Pain quality: gnawing   ?Pain severity:  Moderate ?Onset quality:  Gradual ?Duration:  2 days ?Timing:  Constant ?Progression:  Worsening ?Chronicity:  New ?Associated symptoms: nausea and vomiting   ? ?  ? ?Home Medications ?Prior to Admission medications   ?Medication Sig Start Date End Date Taking? Authorizing Provider  ?calcium carbonate (TUMS - DOSED IN MG ELEMENTAL CALCIUM) 500 MG chewable tablet Chew 1,000 mg by mouth daily as needed for indigestion or heartburn.    [provider]  ?Multiple Vitamin (MULTIVITAMIN WITH MINERALS) TABS tablet Take 1 tablet by mouth daily.    [provider]  ?pantoprazole (PROTONIX) 40 MG tablet Take 1 tablet (40 mg total) by mouth daily. 06/04/20 06/04/21  Rodolph Bong, MD  ?pantoprazole (PROTONIX) 40 MG tablet Take 1 tablet (40 mg total) by mouth daily. 07/06/20 09/04/20  Kerin Salen, MD  ?dicyclomine (BENTYL) 20 MG tablet Take 1 tablet (20 mg total) by mouth 2 (two) times daily. 03/27/19 10/06/19  Lorelee New, PA-C  ?   ? ?Allergies    ?Patient has no known allergies.   ? ?Review of Systems   ?Review of Systems  ?Gastrointestinal:  Positive for abdominal pain, nausea and vomiting.  ?All other systems reviewed and are negative. ? ?Physical Exam ?Updated Vital Signs ?BP 111/77 (BP  Location: Left Arm)   Pulse 97   Temp 100 ?F (37.8 ?C) (Oral)   Resp 18   Ht 5\' 9"  (1.753 m)   Wt 118.4 kg   SpO2 100%   BMI 38.54 kg/m?  ?Physical Exam ?Vitals and nursing note reviewed.  ?Constitutional:   ?   Appearance: She is well-developed.  ?HENT:  ?   Head: Normocephalic.  ?   Mouth/Throat:  ?   Mouth: Mucous membranes are moist.  ?Eyes:  ?   General: No scleral icterus. ?Cardiovascular:  ?   Rate and Rhythm: Normal rate.  ?Pulmonary:  ?   Effort: Pulmonary effort is normal.  ?Abdominal:  ?   Palpations: Abdomen is soft.  ?   Tenderness: There is abdominal tenderness in the right lower quadrant and suprapubic area.  ?Musculoskeletal:     ?   General: Normal range of motion.  ?   Cervical back: Normal range of motion.  ?Skin: ?   General: Skin is warm and dry.  ?Neurological:  ?   Mental Status: She is alert and oriented to person, place, and time.  ?Psychiatric:     ?   Mood and Affect: Mood normal.     ?   Behavior: Behavior normal.  ? ? ?ED Results / Procedures / Treatments   ?Labs ?(all labs ordered are listed, but only abnormal results are displayed) ?Labs Reviewed  ?HCG, QUANTITATIVE, PREGNANCY  ?CBC WITH DIFFERENTIAL/PLATELET  ?BASIC METABOLIC PANEL  ? ? ?  EKG ?None ? ?Radiology ?US OB LESS THAN 14 WEEKS WITH OB TRANSVAGINAL ? ?Result Date: 04/26/2021 ?CLINICAL DATA:  Nausea and vomiting. Patient had IUD removed when she tested positive for pregnancy. EXAM: OBSTETRIC <14 WK [place] AND TRANSVAGINAL OB US TECHNIQUE: Both transabdominal and transvaginal ultrasound examinations were performed for complete evaluation of the gestation as well as the maternal uterus, adnexal regions, and pelvic cul-de-sac. Transvaginal technique was performed to assess early pregnancy. COMPARISON:  April 06, 2020 FINDINGS: Intrauterine gestational sac: None Yolk sac:  Not Visualized. Embryo:  Not Visualized. Cardiac Activity: Not Visualized. Heart Rate: N/A  bpm Subchorionic hemorrhage:  None visualized. Maternal  uterus/adnexae: A 3.1 cm x 2.6 cm x 2.6 cm anechoic structure is seen within the right ovary. No abnormal flow is seen within this region on color Doppler evaluation. IMPRESSION: 1. No evidence of an intrauterine pregnancy. 2. Large simple right ovarian cyst. Correlation with gynecologic consult and follow-up renal ultrasound to determine stability. Reference: Radiology 2019 Nov;293(2):[id number] Electronically Signed   By: Aram Candela M.D.   On: 04/26/2021 23:35   ? ?Procedures ?Procedures  ? ? ?Medications Ordered in ED ?Medications - No data to display ? ?ED Course/ Medical Decision Making/ A&P ?  ? ?Patient presents with increasing RLQ pain in setting of non-intrauterine pregnancy. Pain not improving despite ibuprofen. Patient seen in the office yesterday, after positive urine p ? ?Consult with [facility] on call Vergie Living).  Recommends contacting patient's OB at [facility]. ? ?Consult with Dr. Farrel Demark, on call physician for [facility]. As patient is currently hemodynamically stable, with labs at baseline, and essentially unchanged ultrasound findings, they will see the patient Thursday afternoon in the clinic. Patient will be asked to report to the [facility] ED if pain increases significantly, patient develops vaginal bleeding, dizziness, shortness of breath. ?                        ?Medical Decision Making ?Amount and/or Complexity of Data Reviewed ?Labs: ordered. ? ? ?Patient with abdominal pain in setting of early pregnancy. Low quantitative HCG. No IUP noted on ultrasound. Large right simple ovarian cyst corresponding to patient's current location of pain. Patient is hemodynamically stable, and labs are at baseline (chronic anemia). Concern for ectopic pregnancy, however patient will need repeat HCG at least 48 hours after first to definitively diagnose. Patient is scheduled to be seen in the clinic Thursday afternoon (04/27/21). ? ? ? ? ? ? ? ?Final Clinical  Impression(s) / ED Diagnoses ?Final diagnoses:  ?Cyst of right ovary  ?Abdominal pain affecting pregnancy  ? ? ?Rx / DC Orders ?ED Discharge Orders   ? ? None  ? ?  ? ? ?  ?Felicie Morn, NP ?04/27/21 0054 ? ?  ?Melene Plan, DO ?04/27/21 1507 ? ?

## 2021-04-26 NOTE — ED Triage Notes (Signed)
Pt is c/o abd pain with N/V  Pt states she had an IUD and it was misplaced and she found out Tuesday that she is pregnant  Pt states her dr did an positive preg test and HCG but too early to see on the [place] but the [place] did show she had a hemorraghic ovarian cyst that was actively bleeding on the right   Pt states tonight the pain is worse  Pt has been taking ibuprofen 600 mg at home without relief  Last dose was 4 hrs ago   ?

## 2021-04-27 NOTE — Discharge Instructions (Signed)
Please follow-up in the clinic on Thursday as scheduled. You may alternate tylenol and ibuprofen for pain. If your pain increases significantly, or you develop vaginal bleeding, dizziness, shortness of breath before your clinic visit, go to the [facility] ED. ?

## 2021-06-12 ENCOUNTER — Emergency Department (HOSPITAL_BASED_OUTPATIENT_CLINIC_OR_DEPARTMENT_OTHER): Payer: Medicaid Other

## 2021-06-12 ENCOUNTER — Other Ambulatory Visit: Payer: Self-pay

## 2021-06-12 ENCOUNTER — Emergency Department (HOSPITAL_BASED_OUTPATIENT_CLINIC_OR_DEPARTMENT_OTHER)
Admission: EM | Admit: 2021-06-12 | Discharge: 2021-06-13 | Disposition: A | Discharge status: Home / Self Care | Payer: Medicaid Other | Attending: Emergency Medicine | Admitting: Emergency Medicine

## 2021-06-12 ENCOUNTER — Encounter (HOSPITAL_BASED_OUTPATIENT_CLINIC_OR_DEPARTMENT_OTHER): Payer: Self-pay | Admitting: Pediatrics

## 2021-06-12 DIAGNOSIS — R102 Pelvic and perineal pain: Secondary | ICD-10-CM | POA: Insufficient documentation

## 2021-06-12 DIAGNOSIS — D72829 Elevated white blood cell count, unspecified: Secondary | ICD-10-CM | POA: Insufficient documentation

## 2021-06-12 DIAGNOSIS — D509 Iron deficiency anemia, unspecified: Secondary | ICD-10-CM | POA: Insufficient documentation

## 2021-06-12 DIAGNOSIS — R1031 Right lower quadrant pain: Secondary | ICD-10-CM | POA: Diagnosis present

## 2021-06-12 LAB — COMPREHENSIVE METABOLIC PANEL
ALT: 15 U/L (ref 0–44)
AST: 16 U/L (ref 15–41)
Albumin: 3.8 g/dL (ref 3.5–5.0)
Alkaline Phosphatase: 71 U/L (ref 38–126)
Anion gap: 6 (ref 5–15)
BUN: 10 mg/dL (ref 6–20)
CO2: 26 mmol/L (ref 22–32)
Calcium: 8.7 mg/dL — ABNORMAL LOW (ref 8.9–10.3)
Chloride: 106 mmol/L (ref 98–111)
Creatinine, Ser: 0.9 mg/dL (ref 0.44–1.00)
GFR, Estimated: >60 mL/min (ref >60)
Glucose, Bld: 114 mg/dL — ABNORMAL HIGH (ref 70–99)
Potassium: 3.7 mmol/L (ref 3.5–5.1)
Sodium: 138 mmol/L (ref 135–145)
Total Bilirubin: 0.7 mg/dL (ref 0.3–1.2)
Total Protein: 7.4 g/dL (ref 6.5–8.1)

## 2021-06-12 LAB — URINALYSIS, ROUTINE W REFLEX MICROSCOPIC
Bilirubin Urine: NEGATIVE
Glucose, UA: NEGATIVE mg/dL
Hgb urine dipstick: NEGATIVE
Ketones, ur: NEGATIVE mg/dL
Nitrite: NEGATIVE
Protein, ur: NEGATIVE mg/dL
Specific Gravity, Urine: 1.02 (ref 1.005–1.030)
pH: 6 (ref 5.0–8.0)

## 2021-06-12 LAB — URINALYSIS, MICROSCOPIC (REFLEX): RBC / HPF: NONE SEEN RBC/hpf (ref 0–5)

## 2021-06-12 LAB — CBC
HCT: 31.4 % — ABNORMAL LOW (ref 36.0–46.0)
Hemoglobin: 9.3 g/dL — ABNORMAL LOW (ref 12.0–15.0)
MCH: 20 pg — ABNORMAL LOW (ref 26.0–34.0)
MCHC: 29.6 g/dL — ABNORMAL LOW (ref 30.0–36.0)
MCV: 67.7 fL — ABNORMAL LOW (ref 80.0–100.0)
Platelets: 406 10*3/uL — ABNORMAL HIGH (ref 150–400)
RBC: 4.64 MIL/uL (ref 3.87–5.11)
RDW: 19.5 % — ABNORMAL HIGH (ref 11.5–15.5)
WBC: 13 10*3/uL — ABNORMAL HIGH (ref 4.0–10.5)
nRBC: 0 % (ref 0.0–0.2)

## 2021-06-12 LAB — LIPASE, BLOOD: Lipase: 32 U/L (ref 11–51)

## 2021-06-12 LAB — PREGNANCY, URINE: Preg Test, Ur: NEGATIVE

## 2021-06-12 MED ORDER — IOHEXOL 300 MG/ML  SOLN
100.0000 mL | Freq: Once | INTRAMUSCULAR | Status: AC | PRN
  Administered 2021-06-12: 100 mL via INTRAVENOUS

## 2021-06-12 MED ORDER — ONDANSETRON HCL 4 MG/2ML IJ SOLN
4.0000 mg | Freq: Once | INTRAMUSCULAR | Status: AC
  Administered 2021-06-12: 4 mg via INTRAVENOUS
  Filled 2021-06-12: qty 2

## 2021-06-12 MED ORDER — KETOROLAC TROMETHAMINE 30 MG/ML IJ SOLN
30.0000 mg | Freq: Once | INTRAMUSCULAR | Status: AC
Start: 2021-06-12 — End: 2021-06-12
  Administered 2021-06-12: 30 mg via INTRAVENOUS
  Filled 2021-06-12: qty 1

## 2021-06-12 MED ORDER — MORPHINE SULFATE (PF) 4 MG/ML IV SOLN
4.0000 mg | Freq: Once | INTRAVENOUS | Status: AC
  Administered 2021-06-12: 4 mg via INTRAVENOUS
  Filled 2021-06-12: qty 1

## 2021-06-12 NOTE — ED Triage Notes (Signed)
C/O right lower abdominal pain; reported hx of same 1 month ago and was diagnosed w/ cyst on right ovary & pregnant through HCG level trends. Reported had a recent miscarriage (4/24/) and followed with OB;

## 2021-06-12 NOTE — ED Provider Notes (Signed)
Care assumed from Dr. Jacqulyn Bath, patient with subacute RLQ pain which has worsened, pending CT abdomen and pelvis.  CT scan shows no acute process.  Specifically, no evidence of appendicitis.  I have independently viewed the images, and agree with the radiologist interpretation.  I reexamined the patient, she continues to have significant suprapubic tenderness.  Cause is not clear.  She is discharged with a prescription for a small number of hydrocodone-acetaminophen tablets, told to use over-the-counter acetaminophen and NSAIDs as needed for less severe pain.  Follow-up with her gynecologist in 2 days, return if symptoms worsen.  Results for orders placed or performed during the hospital encounter of 06/12/21  Lipase, blood  Result Value Ref Range   Lipase 32 11 - 51 U/L  Comprehensive metabolic panel  Result Value Ref Range   Sodium 138 135 - 145 mmol/L   Potassium 3.7 3.5 - 5.1 mmol/L   Chloride 106 98 - 111 mmol/L   CO2 26 22 - 32 mmol/L   Glucose, Bld 114 (H) 70 - 99 mg/dL   BUN 10 6 - 20 mg/dL   Creatinine, Ser 1.74 0.44 - 1.00 mg/dL   Calcium 8.7 (L) 8.9 - 10.3 mg/dL   Total Protein 7.4 6.5 - 8.1 g/dL   Albumin 3.8 3.5 - 5.0 g/dL   AST 16 15 - 41 U/L   ALT 15 0 - 44 U/L   Alkaline Phosphatase 71 38 - 126 U/L   Total Bilirubin 0.7 0.3 - 1.2 mg/dL   GFR, Estimated >94 >49 mL/min   Anion gap 6 5 - 15  CBC  Result Value Ref Range   WBC 13.0 (H) 4.0 - 10.5 K/uL   RBC 4.64 3.87 - 5.11 MIL/uL   Hemoglobin 9.3 (L) 12.0 - 15.0 g/dL   HCT 67.5 (L) 91.6 - 38.4 %   MCV 67.7 (L) 80.0 - 100.0 fL   MCH 20.0 (L) 26.0 - 34.0 pg   MCHC 29.6 (L) 30.0 - 36.0 g/dL   RDW 66.5 (H) 99.3 - 57.0 %   Platelets 406 (H) 150 - 400 K/uL   nRBC 0.0 0.0 - 0.2 %  Urinalysis, Routine w reflex microscopic Urine, Clean Catch  Result Value Ref Range   Color, Urine YELLOW YELLOW   APPearance CLEAR CLEAR   Specific Gravity, Urine 1.020 1.005 - 1.030   pH 6.0 5.0 - 8.0   Glucose, UA NEGATIVE NEGATIVE mg/dL    Hgb urine dipstick NEGATIVE NEGATIVE   Bilirubin Urine NEGATIVE NEGATIVE   Ketones, ur NEGATIVE NEGATIVE mg/dL   Protein, ur NEGATIVE NEGATIVE mg/dL   Nitrite NEGATIVE NEGATIVE   Leukocytes,Ua SMALL (A) NEGATIVE  Pregnancy, urine  Result Value Ref Range   Preg Test, Ur NEGATIVE NEGATIVE  Urinalysis, Microscopic (reflex)  Result Value Ref Range   RBC / HPF NONE SEEN 0 - 5 RBC/hpf   WBC, UA 6-10 0 - 5 WBC/hpf   Bacteria, UA RARE (A) NONE SEEN   Squamous Epithelial / LPF 0-5 0 - 5   CT ABDOMEN PELVIS W CONTRAST  Result Date: 06/13/2021 CLINICAL DATA:  Right lower quadrant pain EXAM: CT ABDOMEN AND PELVIS WITH CONTRAST TECHNIQUE: Multidetector CT imaging of the abdomen and pelvis was performed using the standard protocol following bolus administration of intravenous contrast. RADIATION DOSE REDUCTION: This exam was performed according to the departmental dose-optimization program which includes automated exposure control, adjustment of the mA and/or kV according to patient size and/or use of iterative reconstruction technique. CONTRAST:  OMNIPAQUE  IOHEXOL 300 MG/ML  SOLN COMPARISON:  CT 08/30/2020 FINDINGS: Lower chest: No acute abnormality. Hepatobiliary: No focal liver abnormality is seen. Status post cholecystectomy. No biliary dilatation. Pancreas: Unremarkable. No pancreatic ductal dilatation or surrounding inflammatory changes. Spleen: Normal in size without focal abnormality. Adrenals/Urinary Tract: Adrenal glands are normal. Nonobstructing bilateral kidney stones. The bladder is normal Stomach/Bowel: Stomach is within normal limits. Appendix appears normal. No evidence of bowel wall thickening, distention, or inflammatory changes. Vascular/Lymphatic: No significant vascular findings are present. No enlarged abdominal or pelvic lymph nodes. Reproductive: Uterus and bilateral adnexa are unremarkable. Other: No abdominal wall hernia or abnormality. No abdominopelvic ascites. Musculoskeletal:  No acute or significant osseous findings. IMPRESSION: 1. No CT evidence for acute intra-abdominal or pelvic abnormality. Negative for acute appendicitis 2. Nonobstructing bilateral kidney stones. Electronically Signed   By: Jasmine Pang M.D.   On: 06/13/2021 00:08   US PELVIC COMPLETE W TRANSVAGINAL AND TORSION R/O  Result Date: 06/12/2021 CLINICAL DATA:  Right lower quadrant pain EXAM: TRANSABDOMINAL ULTRASOUND OF PELVIS DOPPLER ULTRASOUND OF OVARIES TECHNIQUE: Transabdominal ultrasound examination of the pelvis was performed including evaluation of the uterus, ovaries, adnexal regions, and pelvic cul-de-sac. Color and duplex Doppler ultrasound was utilized to evaluate blood flow to the ovaries. COMPARISON:  CT renal 08/30/2020, CT abdomen pelvis 05/31/2020 FINDINGS: Uterus Measurements: 7.6 x 4.2 x 5.5 cm = volume: 91 mL. No fibroids or other mass visualized. Endometrium Thickness: 12. No focal abnormality visualized. Right ovary Measurements: 2.3 x 1.8 x 2.2 cm = volume: 4.7 mL. Normal appearance/no adnexal mass. Left ovary Measurements: 3.1 x 3.2 x 2.1 cm = volume: 10.6 mL. Normal appearance/no adnexal mass. Pulsed Doppler evaluation demonstrates normal low-resistance arterial and venous waveforms in both ovaries. Other: Trace free fluid within the pelvis. IMPRESSION: Unremarkable pelvic ultrasound. Electronically Signed   By: Tish Frederickson M.D.   On: 06/12/2021 23:06       Dione Booze, MD 06/13/21 [phone]

## 2021-06-12 NOTE — ED Provider Notes (Signed)
Emergency Department Provider Note   I have reviewed the triage vital signs and the nursing notes.   HISTORY  Chief Complaint Abdominal Pain   HPI Jordan Chung is a 31 y.o. female with past medical history reviewed presents emergency department the right lower quadrant abdominal pain.  Patient was seen in April and at that time found to have an early pregnancy along with a right sided ovarian cyst.  States she had some mild discomfort since that encounter.  The pregnancy ultimately failed and she has resumed her normal menstrual cycle, last was 2 weeks prior, but over the past 24 hours the right lower quadrant abdominal pain is worsened significantly.  No radiation of symptoms or other modifying factors.  No fevers or chills.  No UTI symptoms.  No vaginal bleeding or discharge.   Past Medical History:  Diagnosis Date   Hyperemesis gravidarum    Postpartum depression     Review of Systems  Constitutional: No fever/chills Eyes: No visual changes. ENT: No sore throat. Cardiovascular: Denies chest pain. Respiratory: Denies shortness of breath. Gastrointestinal: Positive RLQ abdominal pain.  No nausea, no vomiting.  No diarrhea.  No constipation. Genitourinary: Negative for dysuria. Musculoskeletal: Negative for back pain. Skin: Negative for rash. Neurological: Negative for headaches, focal weakness or numbness.   ____________________________________________   PHYSICAL EXAM:  VITAL SIGNS: ED Triage Vitals  Enc Vitals Group     BP 06/12/21 2030 130/71     Pulse Rate 06/12/21 2030 92     Resp 06/12/21 2030 16     Temp 06/12/21 2030 98.6 F (37 C)     Temp Source 06/12/21 2030 Oral     SpO2 06/12/21 2030 99 %     Weight 06/12/21 2030 267 lb (121.1 kg)     Height 06/12/21 2030 5\' 8"  (1.727 m)   Constitutional: Alert and oriented. Well appearing and in no acute distress. Eyes: Conjunctivae are normal.  Head: Atraumatic. Nose: No congestion/rhinnorhea. Mouth/Throat:  Mucous membranes are moist.   Neck: No stridor.   Cardiovascular: Normal rate, regular rhythm. Good peripheral circulation. Grossly normal heart sounds.   Respiratory: Normal respiratory effort.  No retractions. Lungs CTAB. Gastrointestinal: Soft with focal tenderness in the RLQ. No rebound or guarding. Non-tender in the remaining quadrants. No distention.  Musculoskeletal: No gross deformities of extremities. Neurologic:  Normal speech and language.  Skin:  Skin is warm, dry and intact. No rash noted.  ____________________________________________   LABS (all labs ordered are listed, but only abnormal results are displayed)  Labs Reviewed  COMPREHENSIVE METABOLIC PANEL - Abnormal; Notable for the following components:      Result Value   Glucose, Bld 114 (*)    Calcium 8.7 (*)    All other components within normal limits  CBC - Abnormal; Notable for the following components:   WBC 13.0 (*)    Hemoglobin 9.3 (*)    HCT 31.4 (*)    MCV 67.7 (*)    MCH 20.0 (*)    MCHC 29.6 (*)    RDW 19.5 (*)    Platelets 406 (*)    All other components within normal limits  URINALYSIS, ROUTINE W REFLEX MICROSCOPIC - Abnormal; Notable for the following components:   Leukocytes,Ua SMALL (*)    All other components within normal limits  URINALYSIS, MICROSCOPIC (REFLEX) - Abnormal; Notable for the following components:   Bacteria, UA RARE (*)    All other components within normal limits  LIPASE, BLOOD  PREGNANCY, URINE  ____________________________________________  RADIOLOGY  No results found.  ____________________________________________   PROCEDURES  Procedure(s) performed:   Procedures   ____________________________________________   INITIAL IMPRESSION / ASSESSMENT AND PLAN / ED COURSE  Pertinent labs & imaging results that were available during my care of the patient were reviewed by me and considered in my medical decision making (see chart for details).   This patient  is Presenting for Evaluation of lower abdominal pain, which does require a range of treatment options, and is a complaint that involves a high risk of morbidity and mortality.  The Differential Diagnoses includes but is not exclusive to ectopic pregnancy, ovarian cyst, ovarian torsion, acute appendicitis, urinary tract infection, endometriosis, bowel obstruction, hernia, colitis, renal colic, gastroenteritis, volvulus etc.   Critical Interventions-    Medications  ketorolac (TORADOL) 30 MG/ML injection 30 mg (has no administration in time range)  morphine (PF) 4 MG/ML injection 4 mg (4 mg Intravenous Given 06/12/21 2213)  ondansetron (ZOFRAN) injection 4 mg (4 mg Intravenous Given 06/12/21 2214)    Reassessment after intervention: Symptoms improving.    I decided to review pertinent External Data, and in summary patient with pelvic US in April 26, 2021 which was reviewed showing a large, simple ovarian cyst.   Clinical Laboratory Tests Ordered, included pregnancy test here is negative.  Patient with mild leukocytosis.  No acute kidney injury.  LFTs and bilirubin are normal.   Radiologic Tests Ordered, included ***. I independently interpreted the images and agree with radiology interpretation.   Cardiac Monitor Tracing which shows NSR.   Social Determinants of Health Risk patient is a non-smoker.  Consult complete with  Medical Decision Making: Summary:  Patient presents the emergency department with right lower quadrant abdominal pain along with tenderness.  Known right ovarian cyst from a recent pelvic ultrasound.  Torsion is a consideration and repeat pelvic ultrasound obtained.  Patient is not pregnant.  Appendicitis also in the differential.  Patient is status post cholecystectomy.  Reevaluation with update and discussion with   Considered admission***  Disposition:   ____________________________________________  FINAL CLINICAL IMPRESSION(S) / ED DIAGNOSES  Final diagnoses:   None     NEW OUTPATIENT MEDICATIONS STARTED DURING THIS VISIT:  New Prescriptions   No medications on file    Note:  This document was prepared using Dragon voice recognition software and may include unintentional dictation errors.  Nanda Quinton, MD, [facility] Emergency Medicine

## 2021-06-13 MED ORDER — HYDROCODONE-ACETAMINOPHEN 5-325 MG PO TABS: 1.0000 | ORAL_TABLET | ORAL | 0 refills | Status: DC | PRN

## 2021-06-13 NOTE — Discharge Instructions (Addendum)
Your CT scan and ultrasound did not demonstrate a cause for your pain.  Please follow-up with your gynecologist.    Take ibuprofen or naproxen as needed for pain.  To get additional pain relief, add acetaminophen.  Please be aware that when you combine acetaminophen with either ibuprofen or naproxen better pain relief than you get from taking either medication by itself.  Reserve hydrocodone-acetaminophen for pain not relieved by the above-noted medication.  Return if pain is getting worse.

## 2021-06-13 NOTE — ED Notes (Signed)
Pt verbalizes understanding of discharge instructions. Opportunity for questioning and answers were provided. Pt discharged from ED to home.   ? ?

## 2021-08-21 ENCOUNTER — Encounter (HOSPITAL_COMMUNITY): Payer: Self-pay | Admitting: Obstetrics & Gynecology

## 2021-08-21 ENCOUNTER — Inpatient Hospital Stay (HOSPITAL_COMMUNITY)
Admission: AD | Admit: 2021-08-21 | Discharge: 2021-08-21 | Disposition: A | Discharge status: Home / Self Care | Payer: Medicaid Other | Attending: Obstetrics & Gynecology | Admitting: Obstetrics & Gynecology

## 2021-08-21 DIAGNOSIS — Z3A11 11 weeks gestation of pregnancy: Secondary | ICD-10-CM | POA: Diagnosis not present

## 2021-08-21 DIAGNOSIS — O26891 Other specified pregnancy related conditions, first trimester: Secondary | ICD-10-CM | POA: Insufficient documentation

## 2021-08-21 DIAGNOSIS — Z3A12 12 weeks gestation of pregnancy: Secondary | ICD-10-CM | POA: Insufficient documentation

## 2021-08-21 DIAGNOSIS — O26899 Other specified pregnancy related conditions, unspecified trimester: Secondary | ICD-10-CM

## 2021-08-21 DIAGNOSIS — R109 Unspecified abdominal pain: Secondary | ICD-10-CM | POA: Diagnosis not present

## 2021-08-21 DIAGNOSIS — R112 Nausea with vomiting, unspecified: Secondary | ICD-10-CM | POA: Diagnosis not present

## 2021-08-21 HISTORY — DX: Unspecified ovarian cyst, unspecified side: N83.209

## 2021-08-21 LAB — URINALYSIS, ROUTINE W REFLEX MICROSCOPIC
Bilirubin Urine: NEGATIVE
Glucose, UA: NEGATIVE mg/dL
Hgb urine dipstick: NEGATIVE
Ketones, ur: 20 mg/dL — AB
Nitrite: NEGATIVE
Protein, ur: NEGATIVE mg/dL
Specific Gravity, Urine: 1.023 (ref 1.005–1.030)
pH: 5 (ref 5.0–8.0)

## 2021-08-21 LAB — CBC WITH DIFFERENTIAL/PLATELET
Abs Immature Granulocytes: 0.03 10*3/uL (ref 0.00–0.07)
Basophils Absolute: 0 10*3/uL (ref 0.0–0.1)
Basophils Relative: 0 %
Eosinophils Absolute: 0.1 10*3/uL (ref 0.0–0.5)
Eosinophils Relative: 1 %
HCT: 31.3 % — ABNORMAL LOW (ref 36.0–46.0)
Hemoglobin: 9.2 g/dL — ABNORMAL LOW (ref 12.0–15.0)
Immature Granulocytes: 0 %
Lymphocytes Relative: 33 %
Lymphs Abs: 3.7 10*3/uL (ref 0.7–4.0)
MCH: 19.7 pg — ABNORMAL LOW (ref 26.0–34.0)
MCHC: 29.4 g/dL — ABNORMAL LOW (ref 30.0–36.0)
MCV: 67 fL — ABNORMAL LOW (ref 80.0–100.0)
Monocytes Absolute: 0.5 10*3/uL (ref 0.1–1.0)
Monocytes Relative: 5 %
Neutro Abs: 6.8 10*3/uL (ref 1.7–7.7)
Neutrophils Relative %: 61 %
Platelets: 286 10*3/uL (ref 150–400)
RBC: 4.67 MIL/uL (ref 3.87–5.11)
RDW: 18.6 % — ABNORMAL HIGH (ref 11.5–15.5)
WBC: 11.2 10*3/uL — ABNORMAL HIGH (ref 4.0–10.5)
nRBC: 0 % (ref 0.0–0.2)

## 2021-08-21 LAB — COMPREHENSIVE METABOLIC PANEL
ALT: 12 U/L (ref 0–44)
AST: 15 U/L (ref 15–41)
Albumin: 3.2 g/dL — ABNORMAL LOW (ref 3.5–5.0)
Alkaline Phosphatase: 55 U/L (ref 38–126)
Anion gap: 8 (ref 5–15)
BUN: 6 mg/dL (ref 6–20)
CO2: 23 mmol/L (ref 22–32)
Calcium: 8.8 mg/dL — ABNORMAL LOW (ref 8.9–10.3)
Chloride: 105 mmol/L (ref 98–111)
Creatinine, Ser: 0.69 mg/dL (ref 0.44–1.00)
GFR, Estimated: >60 mL/min (ref >60)
Glucose, Bld: 90 mg/dL (ref 70–99)
Potassium: 3.9 mmol/L (ref 3.5–5.1)
Sodium: 136 mmol/L (ref 135–145)
Total Bilirubin: 0.5 mg/dL (ref 0.3–1.2)
Total Protein: 6.5 g/dL (ref 6.5–8.1)

## 2021-08-21 NOTE — MAU Note (Signed)
Jordan Chung is a 31 y.o. at Unknown here in MAU reporting: has had pretty persistant abd pain since [place].  Called her primary OB today and they can't see her until Friday. No bleeding.  A lot of nausea and vomiting.  Has had an [place] that confirmed IUP.  Is 12 wks preg.   Had a miscarriage earlier this year.   Onset of complaint: Thu Pain score: 6 Vitals:   08/21/21 1604  BP: (!) 112/55  Pulse: 78  Resp: 18  Temp: 98.6 F (37 C)  SpO2: 97%      Lab orders placed from triage:  urine

## 2021-08-21 NOTE — MAU Note (Signed)
+  FH on and fetal movement noted on limited bedside sono.

## 2021-08-21 NOTE — MAU Provider Note (Signed)
History     CSN: [id number]  Arrival date and time: 08/21/21 1530   Event Date/Time   First Provider Initiated Contact with Patient 08/21/21 1812      Chief Complaint  Patient presents with   Abdominal Pain   HPI  Left lower quatrant pain that has gotten worse over the last few days. HX of miscarriage, worried about the pain and another miscarriage. Wants to be sure everything is ok.  She has no bleeding. The pain comes and goes. Moving or changing positions does not change her pain level. No fever. No urinary complaints. Some mild constipation. Having daily BM's however they are not her normal BM's.   OB History     Gravida  4   Para  2   Term  2   Preterm  0   AB  1   Living  2      SAB  1   IAB  0   Ectopic  0   Multiple  0   Live Births  2           Past Medical History:  Diagnosis Date   Hyperemesis gravidarum    Postpartum depression     Past Surgical History:  Procedure Laterality Date   BILIARY STENT PLACEMENT N/A 05/15/2020   Procedure: BILIARY STENT PLACEMENT;  Surgeon: Kerin Salen, MD;  Location: [facility];  Service: Gastroenterology;  Laterality: N/A;   BILIARY STENT PLACEMENT N/A 05/24/2020   Procedure: BILIARY STENT PLACEMENT;  Surgeon: Willis Modena, MD;  Location: [facility];  Service: Endoscopy;  Laterality: N/A;   BIOPSY  07/06/2020   Procedure: BIOPSY;  Surgeon: Kerin Salen, MD;  Location: [facility];  Service: Gastroenterology;;   CHOLECYSTECTOMY N/A 05/09/2020   Procedure: LAPAROSCOPIC CHOLECYSTECTOMY WITH INTRAOPERATIVE CHOLANGIOGRAM;  Surgeon: Abigail Miyamoto, MD;  Location: [facility];  Service: General;  Laterality: N/A;   ENDOSCOPIC RETROGRADE CHOLANGIOPANCREATOGRAPHY (ERCP) WITH PROPOFOL N/A 05/15/2020   Procedure: ENDOSCOPIC RETROGRADE CHOLANGIOPANCREATOGRAPHY (ERCP) WITH PROPOFOL;  Surgeon: Kerin Salen, MD;  Location: [facility];  Service: Gastroenterology;  Laterality: N/A;   ERCP N/A 05/24/2020   Procedure: ENDOSCOPIC  RETROGRADE CHOLANGIOPANCREATOGRAPHY (ERCP);  Surgeon: Willis Modena, MD;  Location: [facility];  Service: Endoscopy;  Laterality: N/A;   ESOPHAGOGASTRODUODENOSCOPY (EGD) WITH PROPOFOL N/A 07/06/2020   Procedure: ESOPHAGOGASTRODUODENOSCOPY (EGD) WITH PROPOFOL;  Surgeon: Kerin Salen, MD;  Location: [facility];  Service: Gastroenterology;  Laterality: N/A;   GASTROINTESTINAL STENT REMOVAL N/A 07/06/2020   Procedure: GASTROINTESTINAL STENT REMOVAL;  Surgeon: Kerin Salen, MD;  [facility]: [facility];  Service: Gastroenterology;  Laterality: N/A;  removal of CBD and PD stent    IR RADIOLOGIST EVAL & MGMT  05/31/2020   PANCREATIC STENT PLACEMENT  05/15/2020   Procedure: PANCREATIC STENT PLACEMENT;  Surgeon: Kerin Salen, MD;  [facility]: [facility];  Service: Gastroenterology;;   [name]  05/15/2020   Procedure: [name];  Surgeon: Kerin Salen, MD;  Location: [facility];  Service: Gastroenterology;;   Francine Graven REMOVAL  05/24/2020   Procedure: STENT REMOVAL;  Surgeon: Willis Modena, MD;  Location: [facility];  Service: Endoscopy;;    Family History  Problem Relation Age of Onset   Diabetes Mother    Depression Mother    Hyperlipidemia Father    Hypertension Father    Cancer Paternal Grandfather     Social History   Tobacco Use   Smoking status: Never   Smokeless tobacco: Never  Vaping Use   Vaping Use: Never used  Substance Use Topics   Alcohol use:  Never   Drug use: Never    Allergies: No Known Allergies  Medications Prior to Admission  Medication Sig Dispense Refill Last Dose   calcium carbonate (TUMS - DOSED IN MG ELEMENTAL CALCIUM) 500 MG chewable tablet Chew 1,000 mg by mouth daily as needed for indigestion or heartburn.      HYDROcodone-acetaminophen (NORCO) 5-325 MG tablet Take 1 tablet by mouth every 4 (four) hours as needed for moderate pain. 10 tablet 0    Multiple Vitamin (MULTIVITAMIN WITH MINERALS) TABS tablet Take 1 tablet by mouth daily.      pantoprazole  (PROTONIX) 40 MG tablet Take 1 tablet (40 mg total) by mouth daily. 30 tablet 1    pantoprazole (PROTONIX) 40 MG tablet Take 1 tablet (40 mg total) by mouth daily. 60 tablet 1    Recent Results (from the past 2160 hour(s))  Lipase, blood     Status: None   Collection Time: 06/12/21  8:32 PM  Result Value Ref Range   Lipase 32 11 - 51 U/L    Comment: Performed at [facility], [address] [place], [place] [address]  Comprehensive metabolic panel     Status: Abnormal   Collection Time: 06/12/21  8:32 PM  Result Value Ref Range   Sodium 138 135 - 145 mmol/L   Potassium 3.7 3.5 - 5.1 mmol/L   Chloride 106 98 - 111 mmol/L   CO2 26 22 - 32 mmol/L   Glucose, Bld 114 (H) 70 - 99 mg/dL    Comment: Glucose reference range applies only to samples taken after fasting for at least 8 hours.   BUN 10 6 - 20 mg/dL   Creatinine, Ser 0.90 0.44 - 1.00 mg/dL   Calcium 8.7 (L) 8.9 - 10.3 mg/dL   Total Protein 7.4 6.5 - 8.1 g/dL   Albumin 3.8 3.5 - 5.0 g/dL   AST 16 15 - 41 U/L   ALT 15 0 - 44 U/L   Alkaline Phosphatase 71 38 - 126 U/L   Total Bilirubin 0.7 0.3 - 1.2 mg/dL   GFR, Estimated >60 >60 mL/min    Comment: (NOTE) Calculated using the CKD-EPI Creatinine Equation (2021)    Anion gap 6 5 - 15    Comment: Performed at [facility], [address] [place], [place] [address]  CBC     Status: Abnormal   Collection Time: 06/12/21  8:32 PM  Result Value Ref Range   WBC 13.0 (H) 4.0 - 10.5 K/uL   RBC 4.64 3.87 - 5.11 MIL/uL   Hemoglobin 9.3 (L) 12.0 - 15.0 g/dL   HCT 31.4 (L) 36.0 - 46.0 %   MCV 67.7 (L) 80.0 - 100.0 fL   MCH 20.0 (L) 26.0 - 34.0 pg   MCHC 29.6 (L) 30.0 - 36.0 g/dL   RDW 19.5 (H) 11.5 - 15.5 %   Platelets 406 (H) 150 - 400 K/uL   nRBC 0.0 0.0 - 0.2 %    Comment: Performed at [facility], [address] [place], [place] [address]  Urinalysis, Routine w reflex microscopic Urine, Clean Catch     Status: Abnormal   Collection Time:  06/12/21  8:32 PM  Result Value Ref Range   Color, Urine YELLOW YELLOW   APPearance CLEAR CLEAR   Specific Gravity, Urine 1.020 1.005 - 1.030   pH 6.0 5.0 - 8.0   Glucose, UA NEGATIVE NEGATIVE mg/dL   Hgb urine dipstick NEGATIVE NEGATIVE  Bilirubin Urine NEGATIVE NEGATIVE   Ketones, ur NEGATIVE NEGATIVE mg/dL   Protein, ur NEGATIVE NEGATIVE mg/dL   Nitrite NEGATIVE NEGATIVE   Leukocytes,Ua SMALL (A) NEGATIVE    Comment: Performed at [facility], [address] [place], [place] [address]  Pregnancy, urine     Status: None   Collection Time: 06/12/21  8:32 PM  Result Value Ref Range   Preg Test, Ur NEGATIVE NEGATIVE    Comment:        THE SENSITIVITY OF THIS METHODOLOGY IS >20 mIU/mL. Performed at [facility], [address] [place], [place] [address]   Urinalysis, Microscopic (reflex)     Status: Abnormal   Collection Time: 06/12/21  8:32 PM  Result Value Ref Range   RBC / HPF NONE SEEN 0 - 5 RBC/hpf   WBC, UA 6-10 0 - 5 WBC/hpf   Bacteria, UA RARE (A) NONE SEEN   Squamous Epithelial / LPF 0-5 0 - 5    Comment: Performed at [facility], [address] [place], [place] [address]  Urinalysis, Routine w reflex microscopic Urine, Clean Catch     Status: Abnormal   Collection Time: 08/21/21  4:30 PM  Result Value Ref Range   Color, Urine YELLOW YELLOW   APPearance CLOUDY (A) CLEAR   Specific Gravity, Urine 1.023 1.005 - 1.030   pH 5.0 5.0 - 8.0   Glucose, UA NEGATIVE NEGATIVE mg/dL   Hgb urine dipstick NEGATIVE NEGATIVE   Bilirubin Urine NEGATIVE NEGATIVE   Ketones, ur 20 (A) NEGATIVE mg/dL   Protein, ur NEGATIVE NEGATIVE mg/dL   Nitrite NEGATIVE NEGATIVE   Leukocytes,Ua MODERATE (A) NEGATIVE   RBC / HPF 0-5 0 - 5 RBC/hpf   WBC, UA 0-5 0 - 5 WBC/hpf   Bacteria, UA RARE (A) NONE SEEN   Squamous Epithelial / LPF 11-20 0 - 5   Mucus PRESENT     Comment: Performed at [facility], [address], [place], [place] [address]   Culture, [place] Urine     Status: Abnormal   Collection Time: 08/21/21  6:19 PM   Specimen: OB Clean Catch; Urine  Result Value Ref Range   Specimen Description OB CLEAN CATCH    Special Requests NONE    Culture (A)     50,000 COLONIES/mL MULTIPLE SPECIES PRESENT, SUGGEST RECOLLECTION NO GROUP B STREP (S.AGALACTIAE) ISOLATED Performed at [facility], [address] [place], [place] [address]    Report Status 08/23/2021 FINAL   CBC with Differential/Platelet     Status: Abnormal   Collection Time: 08/21/21  6:29 PM  Result Value Ref Range   WBC 11.2 (H) 4.0 - 10.5 K/uL   RBC 4.67 3.87 - 5.11 MIL/uL   Hemoglobin 9.2 (L) 12.0 - 15.0 g/dL   HCT 27.0 (L) 62.3 - 76.2 %   MCV 67.0 (L) 80.0 - 100.0 fL   MCH 19.7 (L) 26.0 - 34.0 pg   MCHC 29.4 (L) 30.0 - 36.0 g/dL   RDW 83.1 (H) 51.7 - 61.6 %   Platelets 286 150 - 400 K/uL    Comment: REPEATED TO VERIFY   nRBC 0.0 0.0 - 0.2 %   Neutrophils Relative % 61 %   Neutro Abs 6.8 1.7 - 7.7 K/uL   Lymphocytes Relative 33 %   Lymphs Abs 3.7 0.7 - 4.0 K/uL   Monocytes Relative 5 %   Monocytes Absolute 0.5 0.1 - 1.0 K/uL  Eosinophils Relative 1 %   Eosinophils Absolute 0.1 0.0 - 0.5 K/uL   Basophils Relative 0 %   Basophils Absolute 0.0 0.0 - 0.1 K/uL   Immature Granulocytes 0 %   Abs Immature Granulocytes 0.03 0.00 - 0.07 K/uL    Comment: Performed at [facility] [address], [place], [place] [address]  Comprehensive metabolic panel     Status: Abnormal   Collection Time: 08/21/21  6:29 PM  Result Value Ref Range   Sodium 136 135 - 145 mmol/L   Potassium 3.9 3.5 - 5.1 mmol/L   Chloride 105 98 - 111 mmol/L   CO2 23 22 - 32 mmol/L   Glucose, Bld 90 70 - 99 mg/dL    Comment: Glucose reference range applies only to samples taken after fasting for at least 8 hours.   BUN 6 6 - 20 mg/dL   Creatinine, Ser 0.69 0.44 - 1.00 mg/dL   Calcium 8.8 (L) 8.9 - 10.3 mg/dL   Total Protein 6.5 6.5 - 8.1 g/dL   Albumin 3.2 (L) 3.5 - 5.0  g/dL   AST 15 15 - 41 U/L   ALT 12 0 - 44 U/L   Alkaline Phosphatase 55 38 - 126 U/L   Total Bilirubin 0.5 0.3 - 1.2 mg/dL   GFR, Estimated >60 >60 mL/min    Comment: (NOTE) Calculated using the CKD-EPI Creatinine Equation (2021)    Anion gap 8 5 - 15    Comment: Performed at [facility], [place] [address], [place], [place] [address]    Review of Systems  Constitutional:  Negative for fever.  Gastrointestinal:  Positive for abdominal pain.  Genitourinary:  Negative for decreased urine volume, dysuria and vaginal bleeding.   Physical Exam   Blood pressure (!) 112/55, pulse 78, temperature 98.6 F (37 C), temperature source Oral, resp. rate 18, height 5\' 8"  (1.727 m), weight 118.5 kg, SpO2 97 %.  Physical Exam Constitutional:      General: She is not in acute distress.    Appearance: She is well-developed. She is obese. She is not ill-appearing, toxic-appearing or diaphoretic.  Abdominal:     Tenderness: There is generalized abdominal tenderness. There is no guarding or rebound.  Neurological:     Mental Status: She is alert and oriented to person, place, and time.    MAU Course  Procedures Pt informed that the ultrasound is considered a limited OB ultrasound and is not intended to be a complete ultrasound exam.  Patient also informed that the ultrasound is not being completed with the intent of assessing for fetal or placental anomalies or any pelvic abnormalities.  Explained that the purpose of today's ultrasound is to assess for  viability.  Patient acknowledges the purpose of the exam and the limitations of the study.   Active fetus noted.   MDM  Patient waited a very long time in the waiting area d/t census of the unit. She was seen and examined in triage. A limited US was done. Labs were ordered and she was not able to stay for a bed and further workup. [place] reassured her.     Assessment and Plan   A:  1. Abdominal pain during pregnancy, antepartum   2. [redacted] weeks  gestation of pregnancy      P:  Recommended she stay for further evaluation. Patient unable to stay Return for worsening symptoms  Aily Tzeng, Artist Pais, NP. 08/25/2021 7:10 PM

## 2021-08-23 LAB — CULTURE, OB URINE: Culture: 50000 — AB

## 2021-09-27 IMAGING — DX DG ABDOMEN 1V
2 series · 2 of 2 positions shown · non-contrast
Comparison: CT 05/13/2020

CLINICAL DATA: Abdominal pain. Cholecystectomy 12 days ago, recent
biliary stent placement.

EXAM:
ABDOMEN - 1 VIEW

[abdomen kub (1 of 2)]
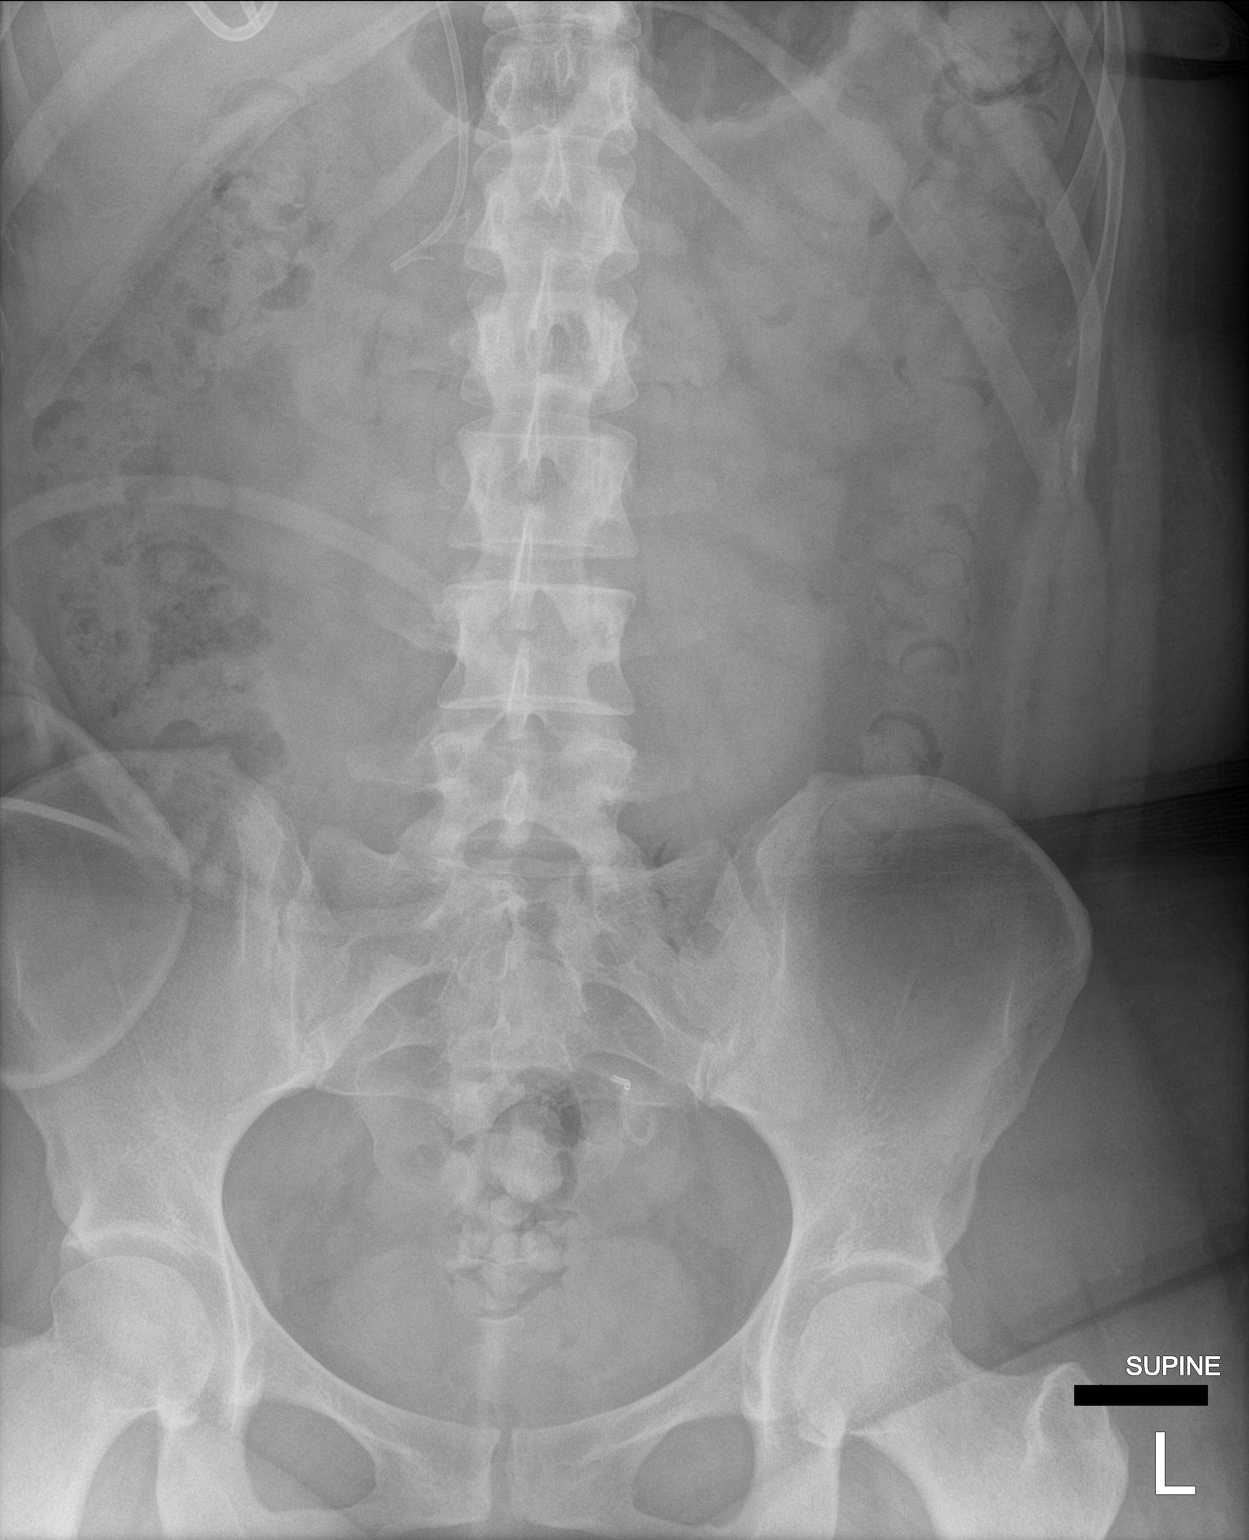

[abdomen kub (2 of 2)]
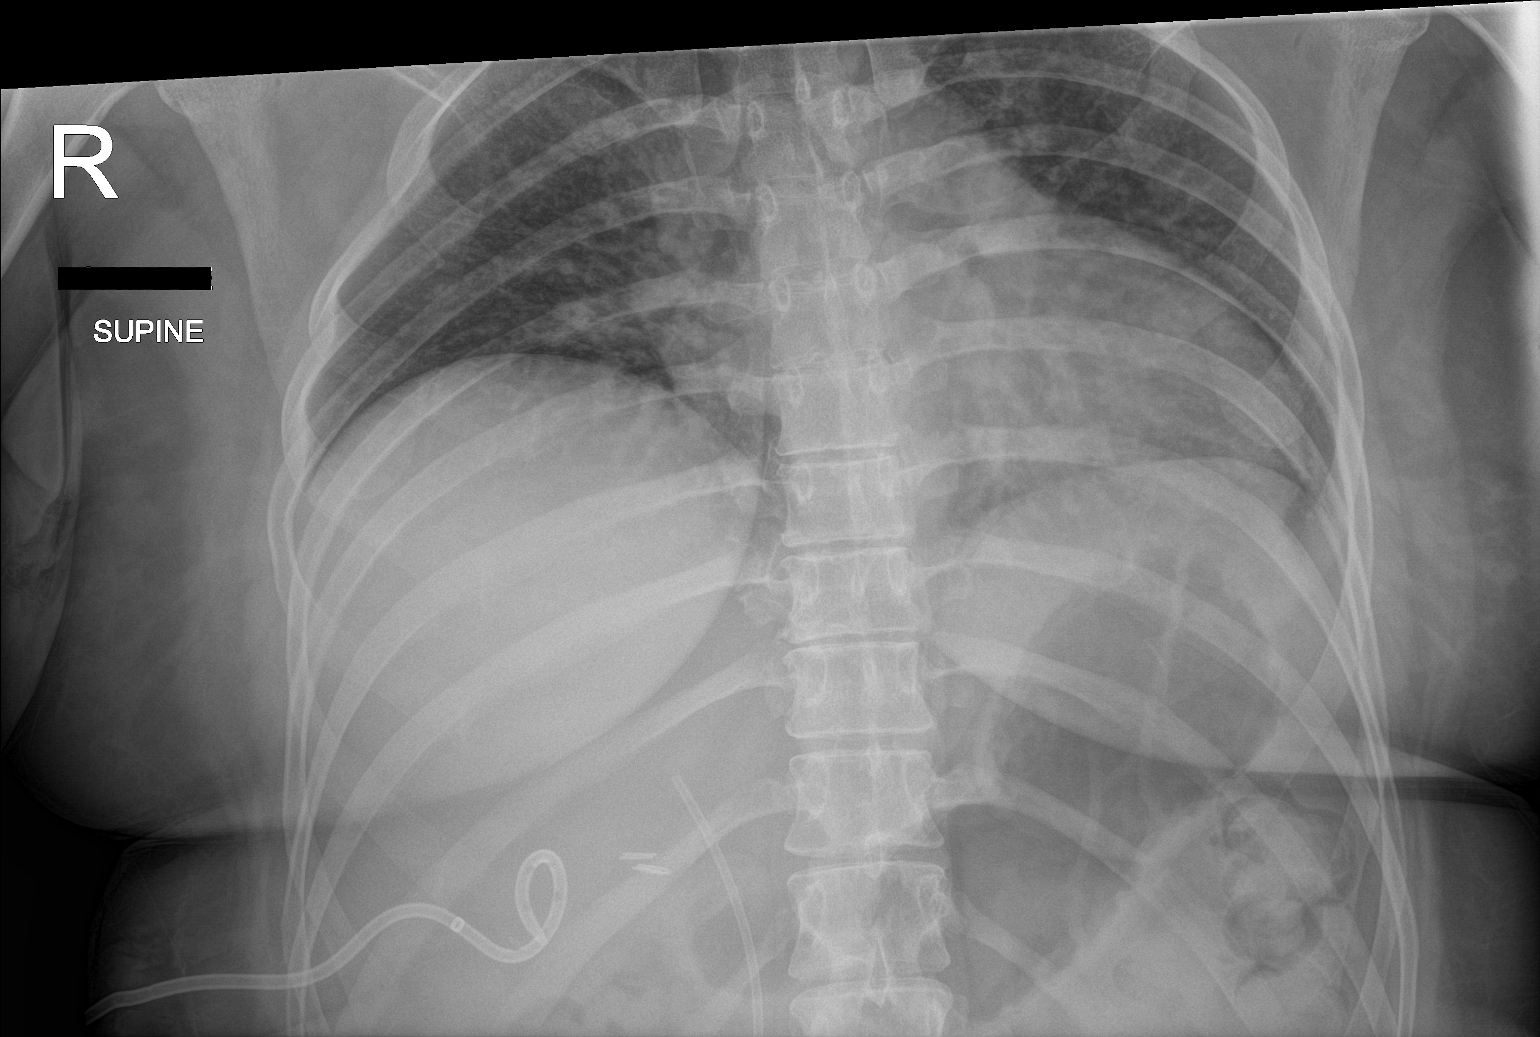

[2 of 2 positions shown; findings below may reference images not displayed]

FINDINGS: Portable supine views of the abdomen obtained. Right upper quadrant
biliary stent in place. Right upper quadrant drainage catheter in
place. Surgical clips from cholecystectomy. No bowel dilatation or
evidence of obstruction. Small to moderate colonic stool burden.
Intrarenal calculi on recent CT are partially obscured by overlying
stool. C shaped 2 cm small bore catheter projects over the left
sacrum, unclear if this is external to the patient. Low lung volumes
without significant pleural effusion or focal airspace disease.
IMPRESSION: 1. Right upper quadrant biliary stent in place. Right upper quadrant
drainage catheter in place.
2. Small bore C-shaped density projecting over the left pelvis is of
unknown etiology, may be external to the patient.
3. Normal bowel gas pattern.

## 2021-09-27 IMAGING — CT CT ABD-PELV W/O CM
2 of 4 series · 15 of 46 positions shown, 17 images · non-contrast
Comparison: Most recent CT 05/13/2020

CLINICAL DATA: Abdominal pain and fever. Postop. Recent
cholecystectomy complicated by bile leak with biliary stenting and
percutaneous drainage.

EXAM:
CT ABDOMEN AND PELVIS WITHOUT CONTRAST
TECHNIQUE: Multidetector CT imaging of the abdomen and pelvis was performed
following the standard protocol without IV contrast.

[Series 2: axial st · axial · 0.86mm/px · z∈[-560,-75]mm · 12 of 109 slices shown, 14 images]
[im 6/109  soft-tissue]
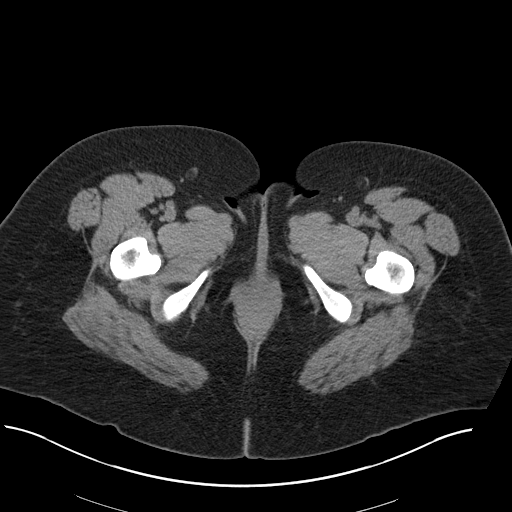
[im 6/109  bone]
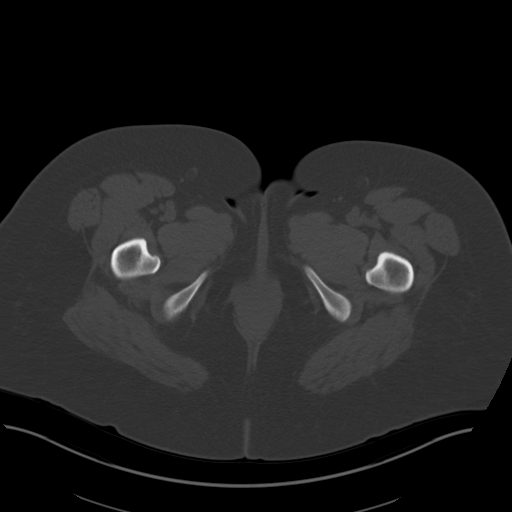
[im 16/109  soft-tissue]
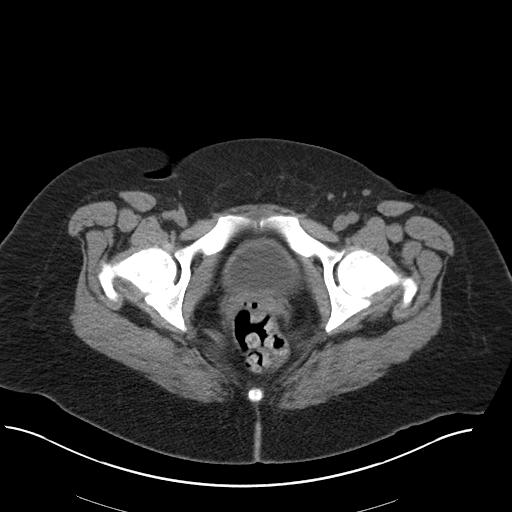
[im 26/109  soft-tissue]
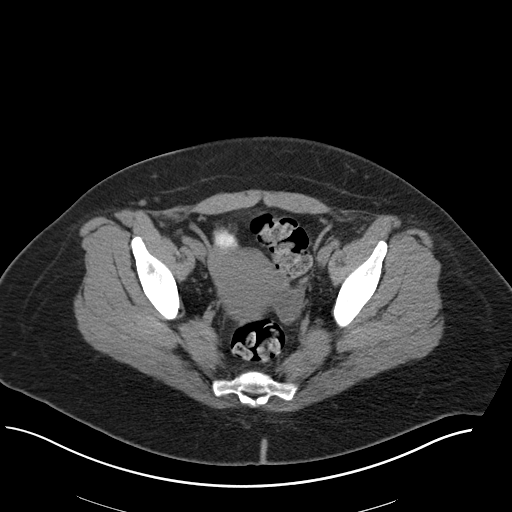
[im 31/109  soft-tissue]
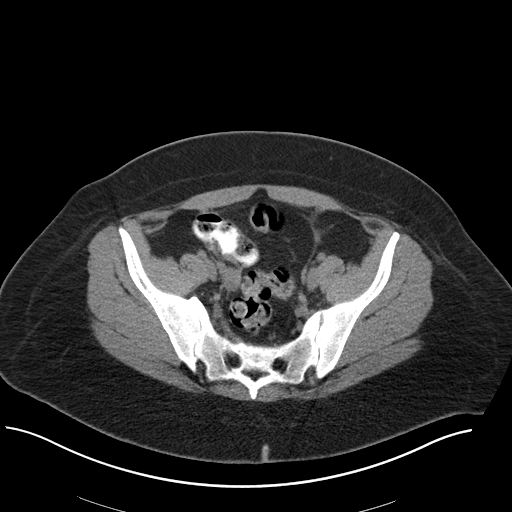
[im 42/109  soft-tissue]
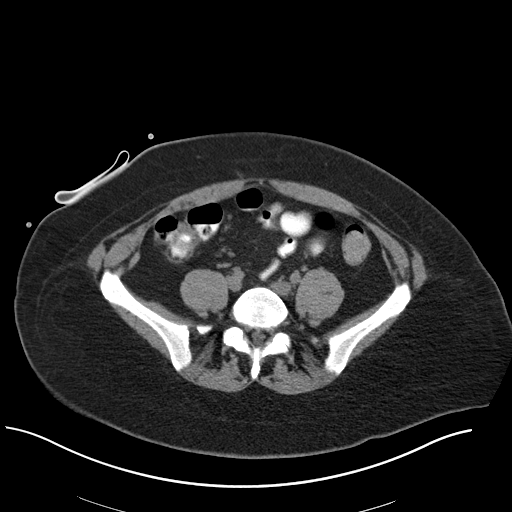
[im 52/109  soft-tissue]
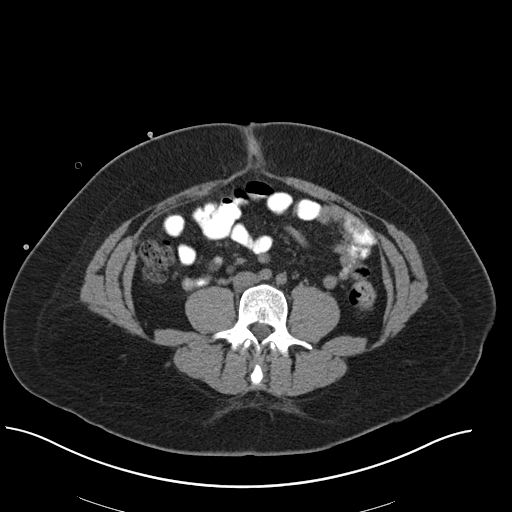
[im 57/109  soft-tissue]
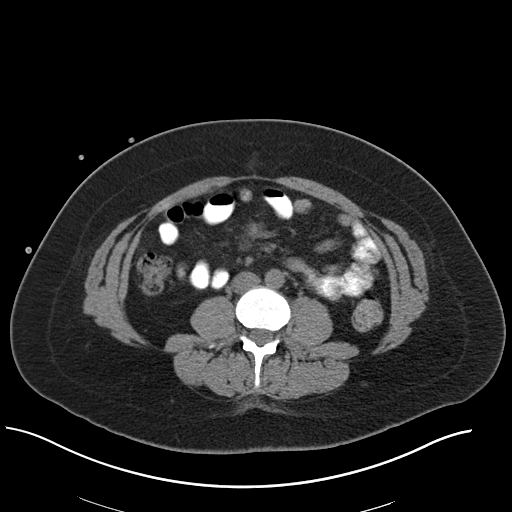
[im 67/109  soft-tissue]
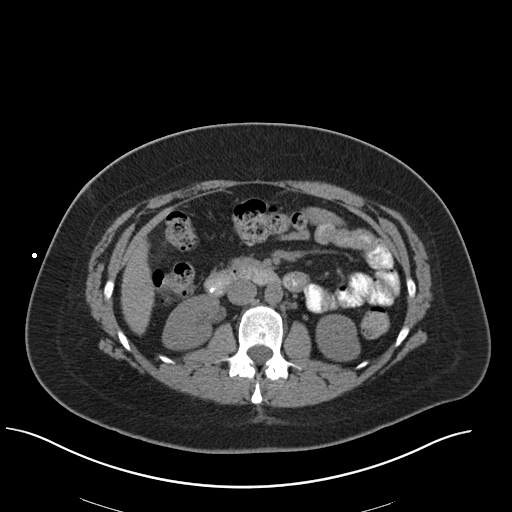
[im 78/109  soft-tissue]
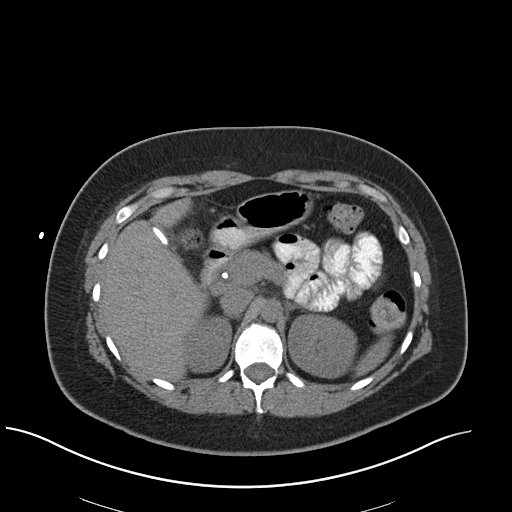
[im 78/109  bone]
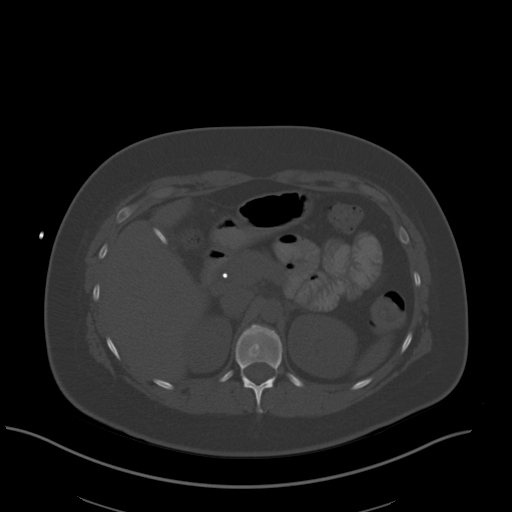
[im 83/109  soft-tissue]
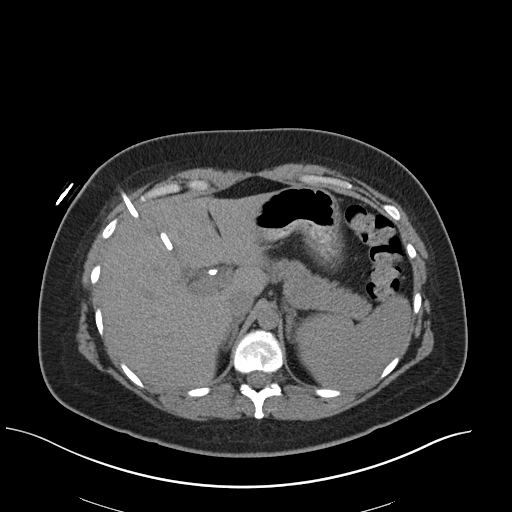
[im 93/109  soft-tissue]
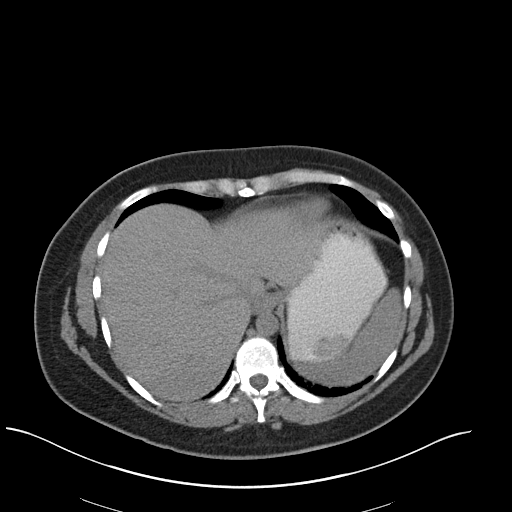
[im 103/109  soft-tissue]
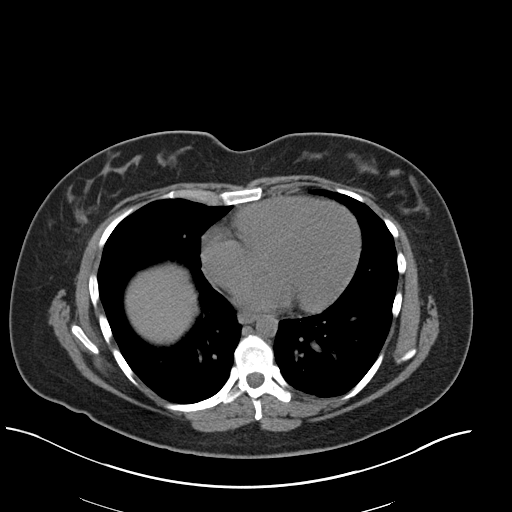

[Series 5: coronal st · coronal · 0.86mm/px · 3 of 161 slices shown]
[im 54/161  soft-tissue]
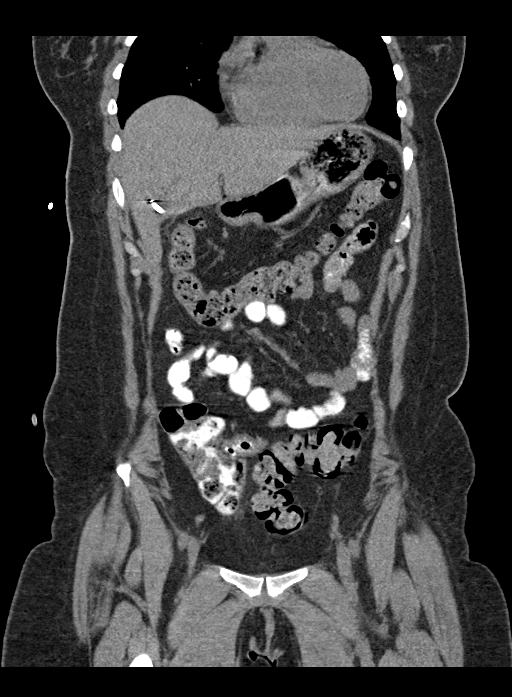
[im 72/161  soft-tissue]
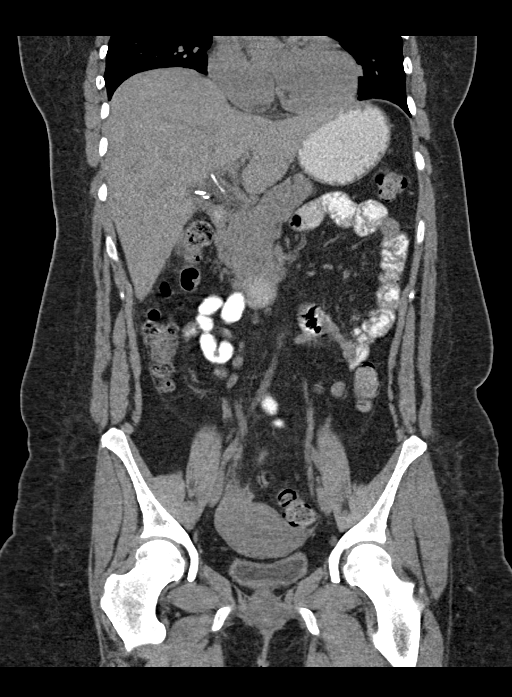
[im 89/161  soft-tissue]
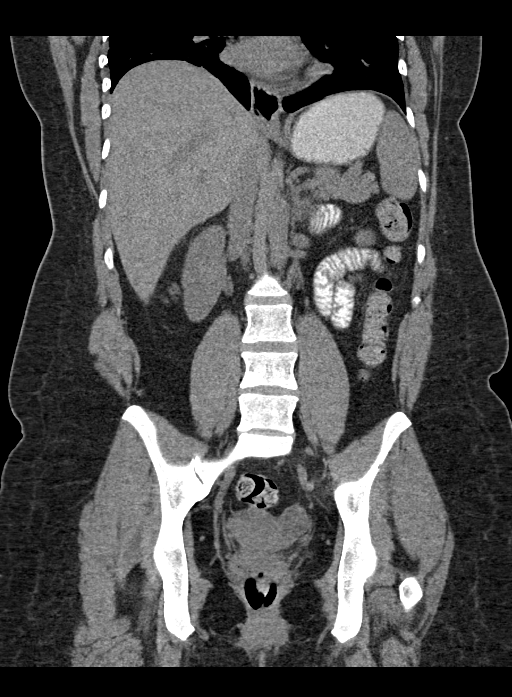

[15 of 46 positions shown; findings below may reference images not displayed]

FINDINGS: Lower chest: Mild lower lobe atelectasis.  No pleural fluid.

Hepatobiliary: Biliary stent in place decompressing the biliary
tree. No intra or extrahepatic biliary ductal dilatation. Drainage
catheter in the gallbladder fossa with resolution of previous fluid
collection. No inflammation along the course of the catheter. No new
hepatic abnormality.

Pancreas: Faint peripancreatic stranding about the pancreatic head,
series 2, image 34. No ductal dilatation. No acute pancreatic
collection.

Spleen: Normal in size without focal abnormality.

Adrenals/Urinary Tract: Normal adrenal glands. Bilateral intrarenal
calculi without hydronephrosis or perinephric edema. No ureteral
stones. No perinephric edema. Urinary bladder is minimally
distended.

Stomach/Bowel: Patulous distal esophagus. Unremarkable stomach.
Normal positioning of the duodenum and ligament of Treitz. Normal
small bowel without obstruction or inflammation. Normal appendix.
Moderate colonic stool burden without colonic wall thickening or
inflammation.

Vascular/Lymphatic: Normal caliber abdominal aorta. Mild aortic
atherosclerosis, advanced for age. No enlarged lymph nodes in the
abdomen or pelvis.

Reproductive: Uterus and bilateral adnexa are unremarkable.

Other: Fluid collection in the gallbladder fossa has resolved after
percutaneous drainage. No new intra-abdominal collection. No free
fluid. No free air. Postsurgical change of the anterior abdominal
wall without subcutaneous collection.

Musculoskeletal: There are no acute or suspicious osseous
abnormalities.
IMPRESSION: 1. Percutaneous drainage catheter in the gallbladder fossa with
resolved fluid collection.
2. Biliary stent in place decompressing the biliary tree. No intra
or extrahepatic biliary ductal dilatation.
3. Faint peripancreatic stranding about the pancreatic head, can be
seen with acute pancreatitis. Recommend correlation with pancreatic
enzymes.
4. Bilateral nonobstructing nephrolithiasis.
5. Mild aortic atherosclerosis is age advanced.

Aortic Atherosclerosis ([id number]-[id number].T).

## 2021-09-28 IMAGING — DX DG CHEST 1V PORT
1 series · 1 of 1 positions shown · non-contrast
Comparison: 05/13/2020

CLINICAL DATA: Increased shortness of breath.

EXAM:
PORTABLE CHEST 1 VIEW

[chest ap]
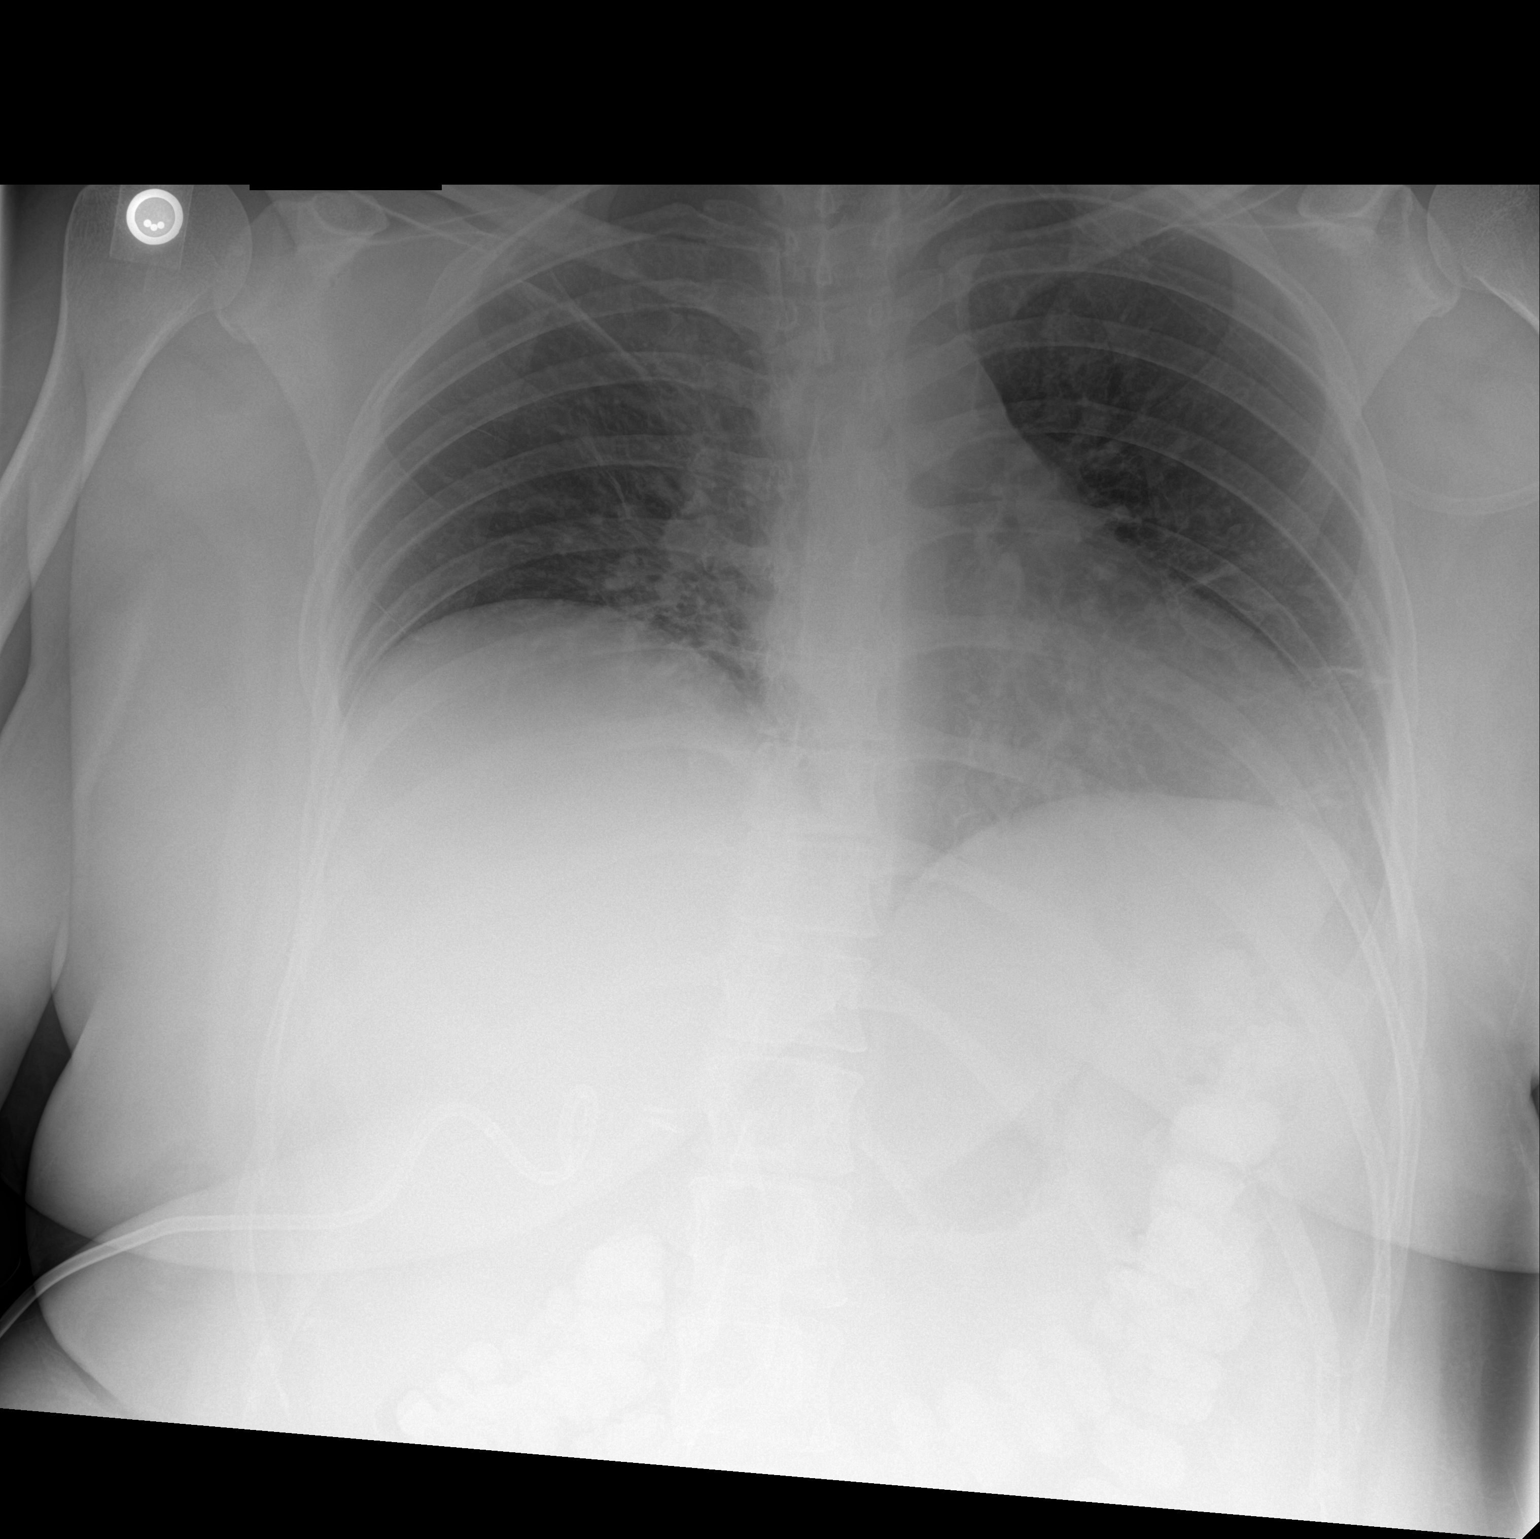

[1 of 1 positions shown; findings below may reference images not displayed]

FINDINGS: There is stable elevation of the RIGHT hemidiaphragm. Heart size is
accentuated by technique. There are scattered areas of subsegmental
atelectasis. No consolidations or pulmonary edema.
IMPRESSION: Shallow inflation.  Subsegmental atelectasis.

## 2021-09-30 IMAGING — RF DG ERCP WO/W SPHINCTEROTOMY
1 series · 5 of 5 positions shown · non-contrast
Comparison: ERCP-05/15/2020

CLINICAL DATA: ERCP with biliary stent exchange for bile leak.

EXAM:
ERCP
TECHNIQUE: Multiple spot images obtained with the fluoroscopic device and
submitted for interpretation post-procedure.
FLUOROSCOPY TIME:  45 seconds

[Series 1: run · 5 of 5 slices shown]
[im 1/5]
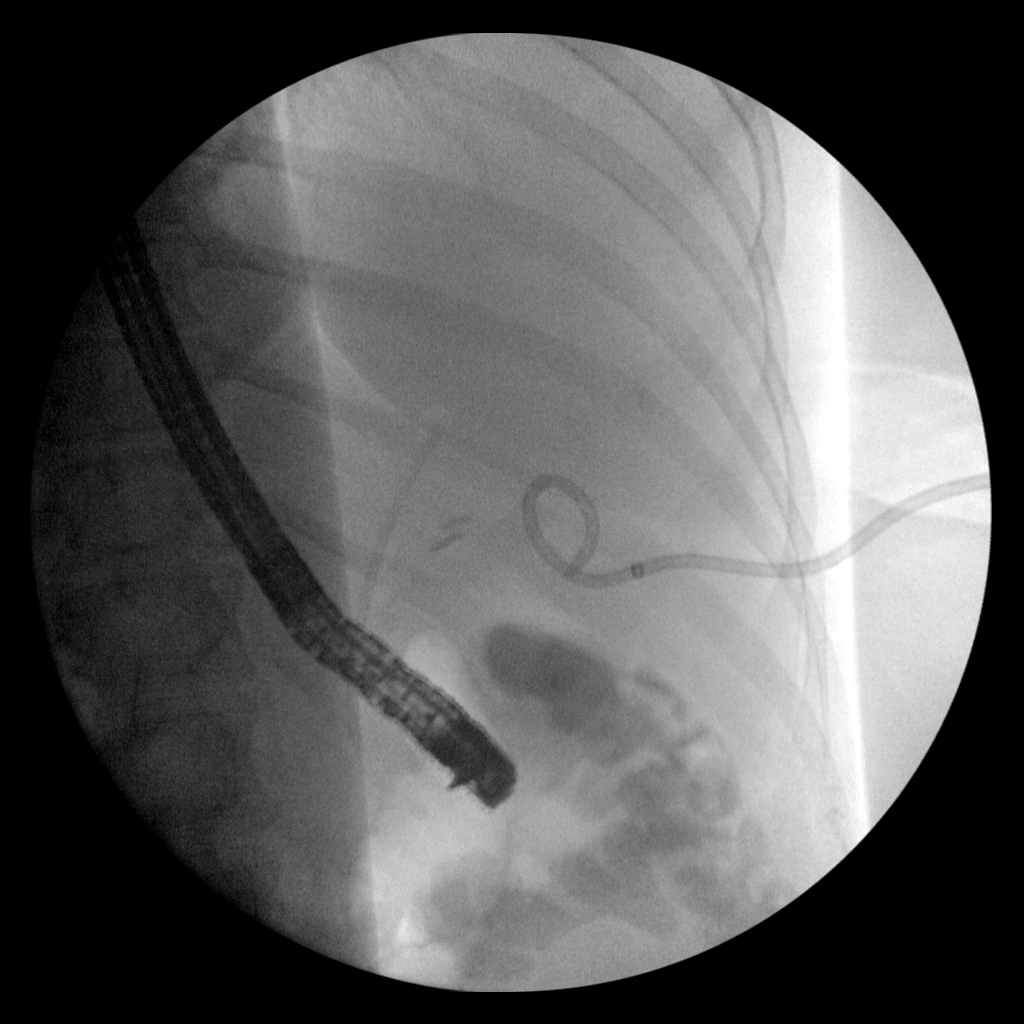
[im 2/5]
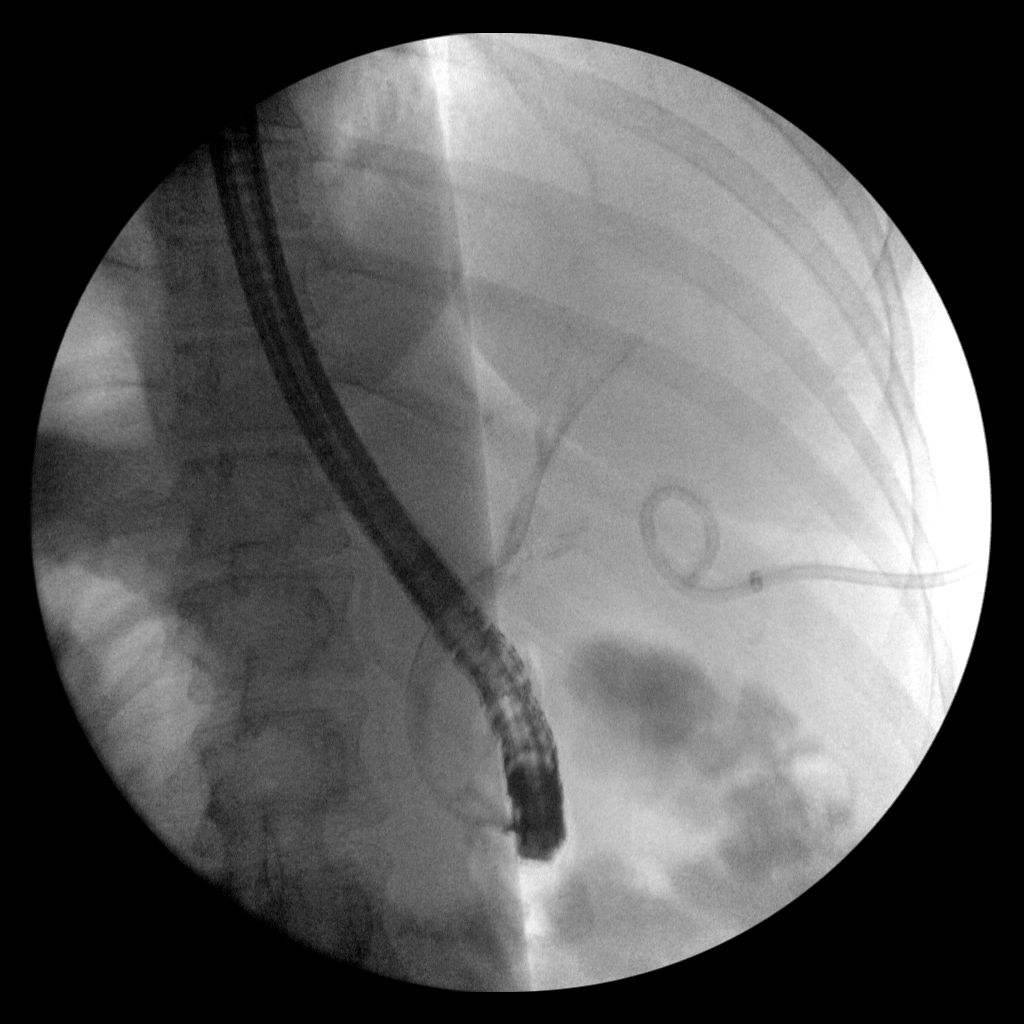
[im 3/5]
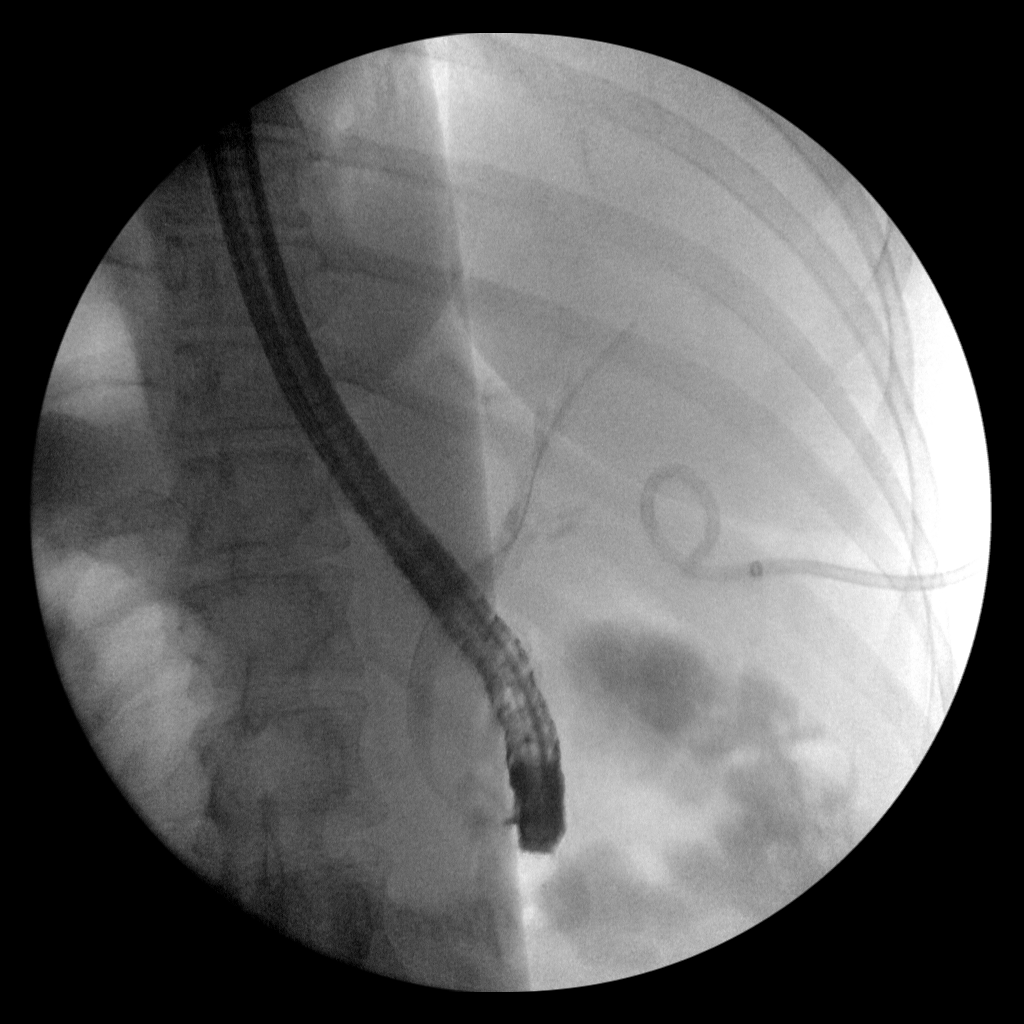
[im 4/5]
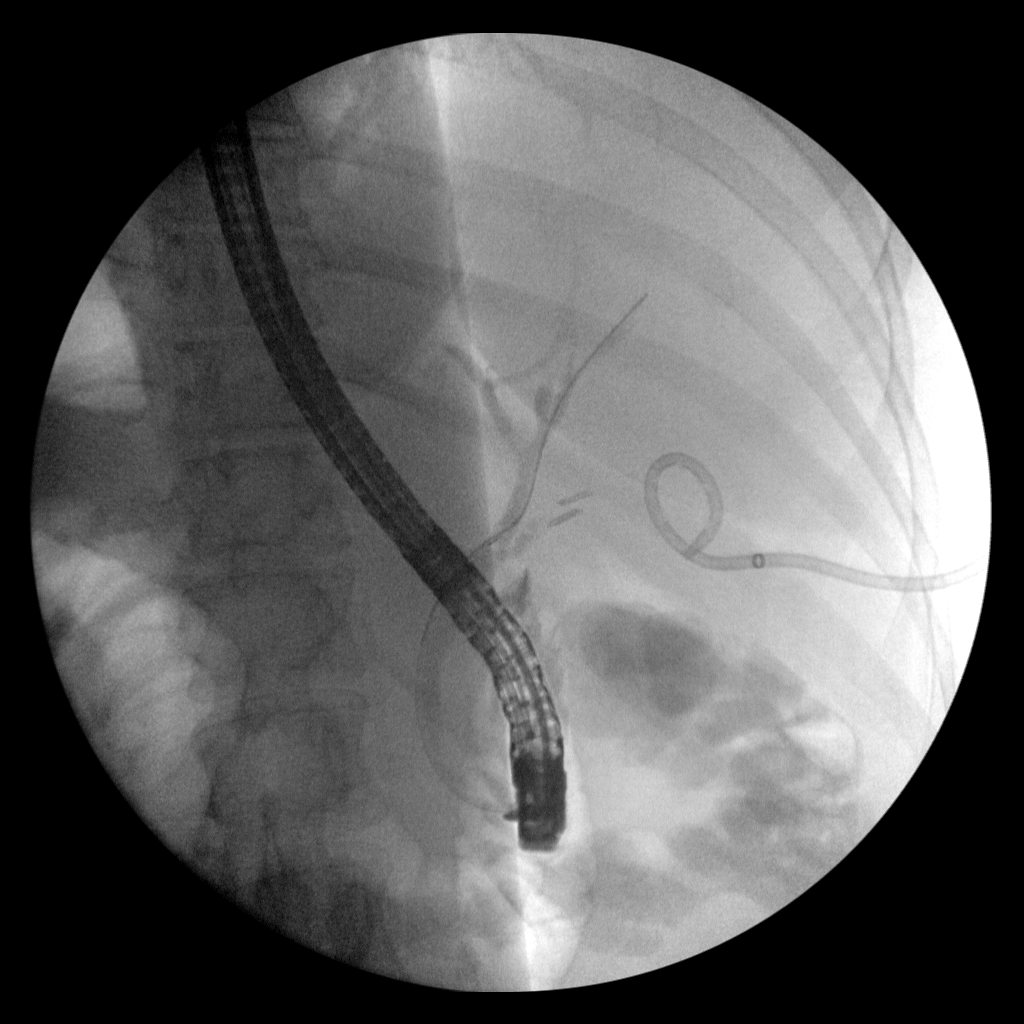
[im 5/5]
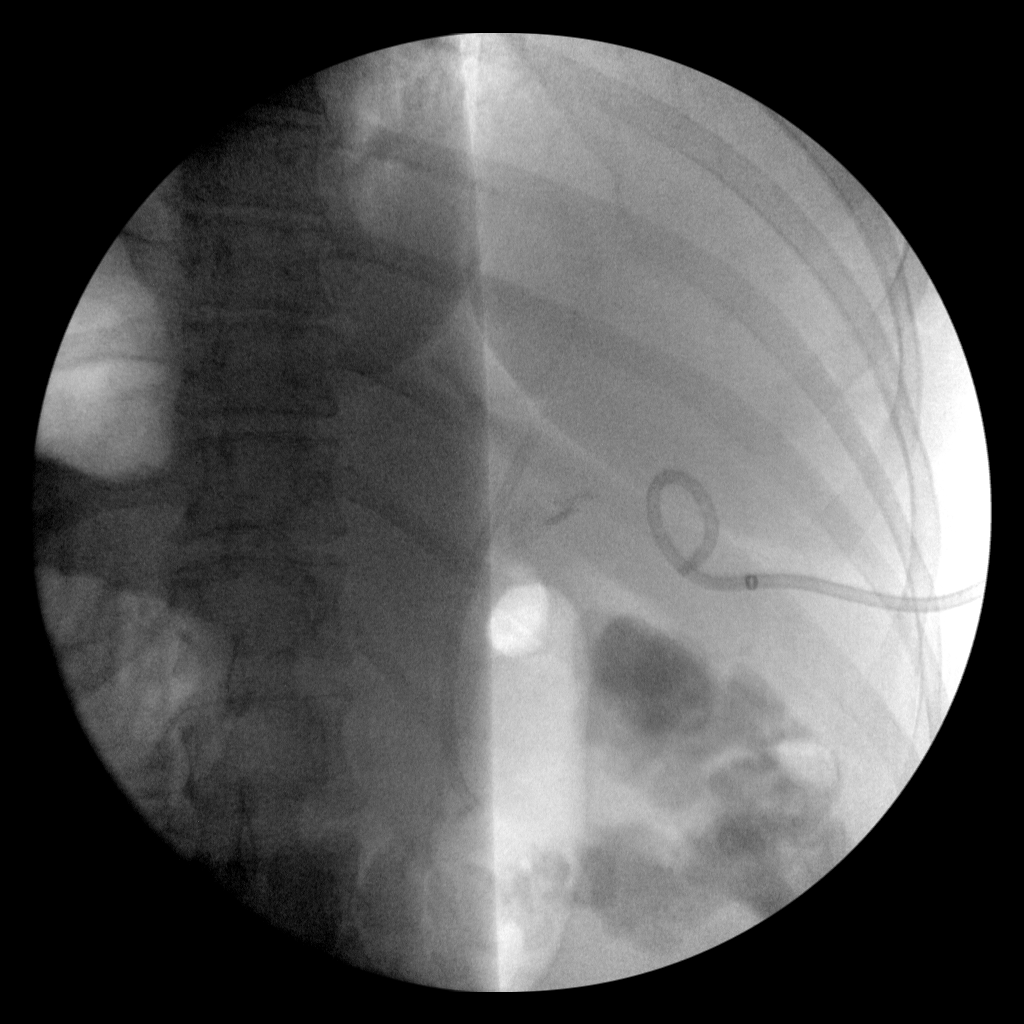

[5 of 5 positions shown; findings below may reference images not displayed]

Nuclear medicine HIDA scan-05/22/2020; 05/13/2020

CT abdomen and pelvis-05/21/2020; 05/13/2020

CT guided percutaneous drainage catheter placement into gallbladder
fossa-05/14/2020
FINDINGS: Six spot fluoroscopic images of the right upper abdominal quadrant
during ERCP are provided for review.

Initial image demonstrates an ERCP probe overlying the right upper
abdominal quadrant. Pre-existing internal biliary stent overlies
expected location of the CBD. A percutaneous drainage catheter
overlies expected location of the gallbladder fossa. Cholecystectomy
clips.

Subsequent images demonstrate interval removal of pre-existing
biliary stent with selective cannulation and opacification of the
CBD.

There is opacification of the cystic duct without definitive
evidence of contrast extravasation or passage of contrast to the
level of the percutaneous drainage catheter however examination
degraded secondary to technique.

Completion images demonstrate placement of a new biliary stent
overlying expected location of the CBD traversing the expected
confluence with the cystic duct.
IMPRESSION: ERCP with internal biliary stent exchange is above.

These images were submitted for radiologic interpretation only.
Please see the procedural report for the amount of contrast and the
fluoroscopy time utilized.

## 2021-10-17 IMAGING — US US ABDOMEN LIMITED
1 series · 15 of 15 positions shown · non-contrast
Comparison: None.

CLINICAL DATA: Apparent inflammation with concern for fluid at
quadrant surgical site

EXAM:
ULTRASOUND RIGHT UPPER QUADRANT ABDOMINAL WALL

[Series 1: us abdomen limited mc & wl · 15 acquisitions, 15 frames shown]
[im 1/15]
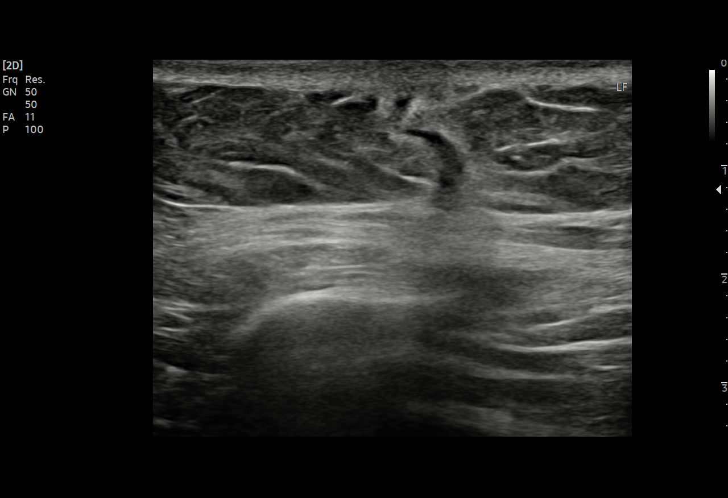
[im 2/15]
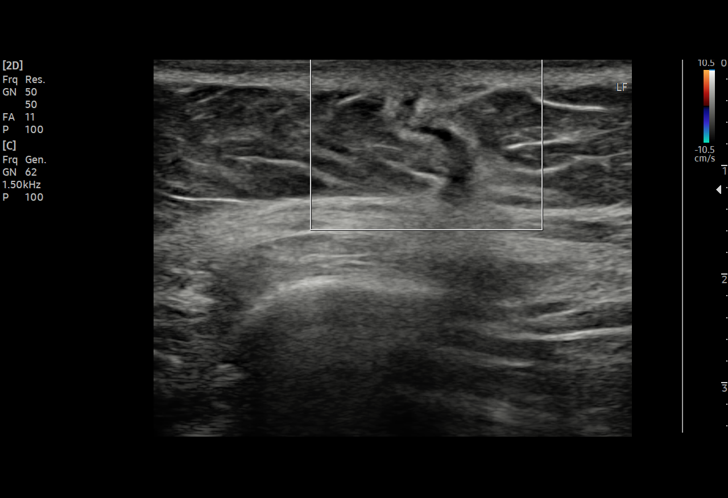
[im 3/15]
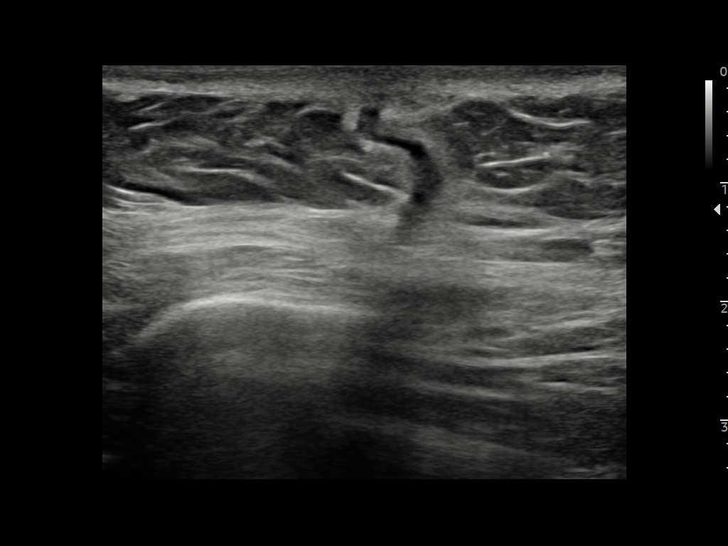
[im 4/15]
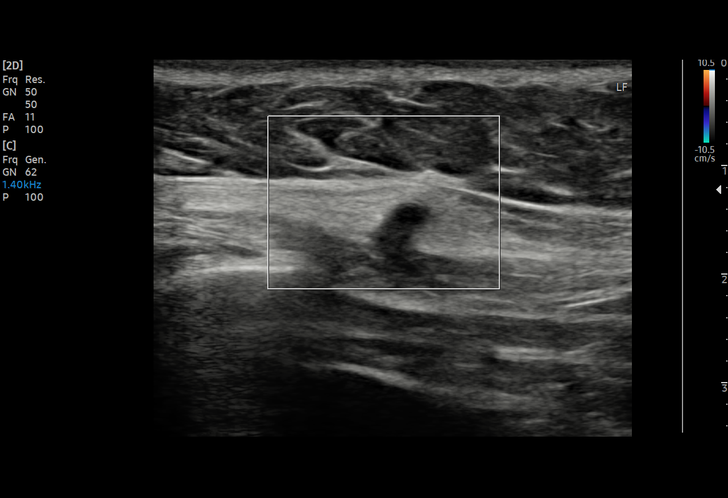
[im 5/15]
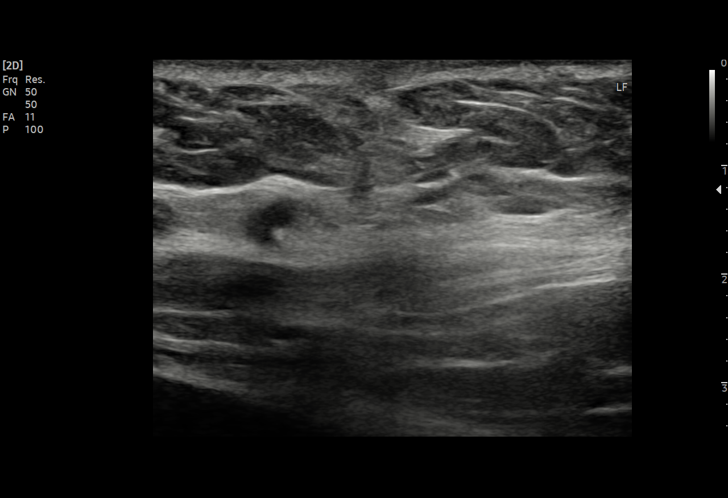
[im 6/15]
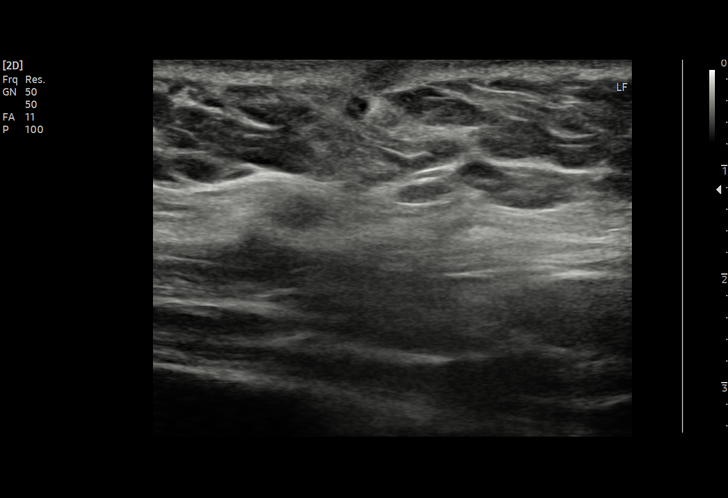
[im 7/15]
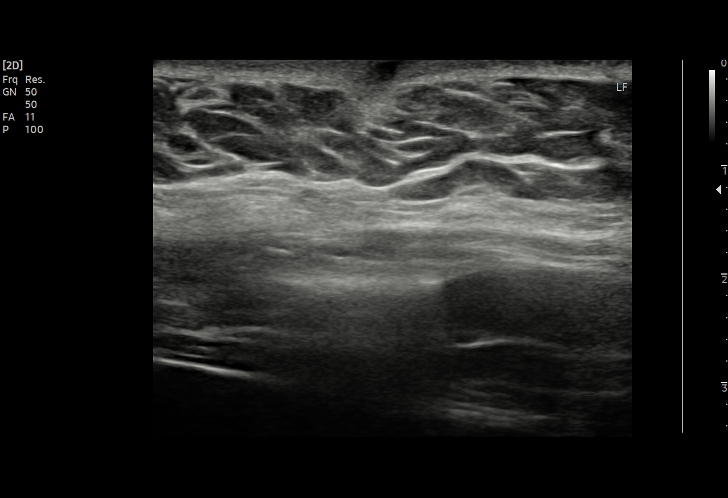
[im 8/15]
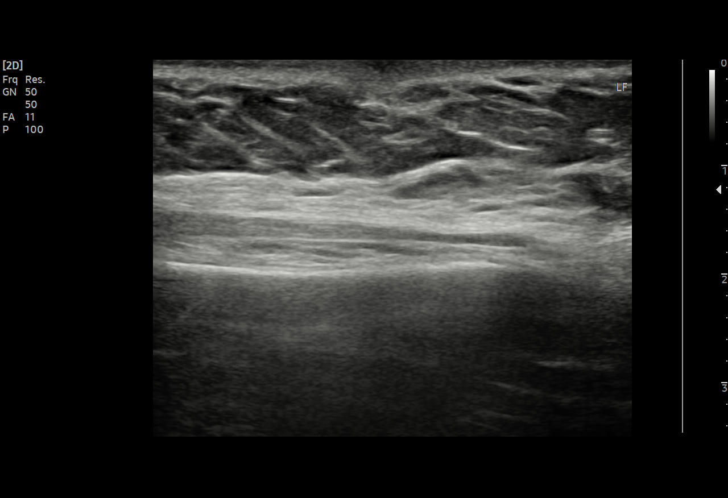
[im 9/15]
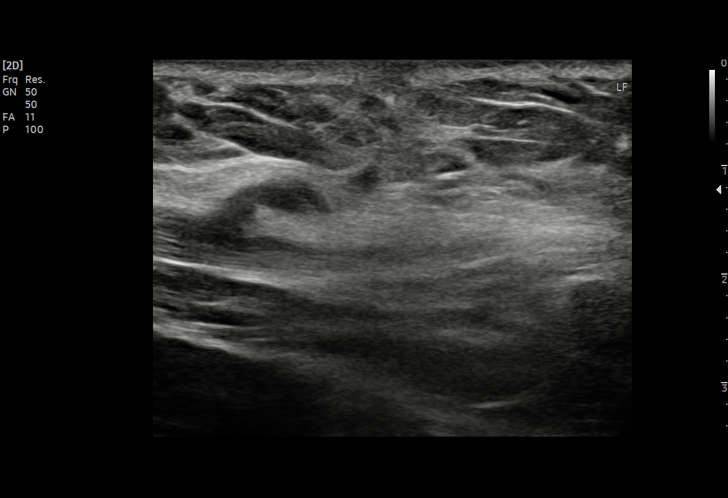
[im 10/15]
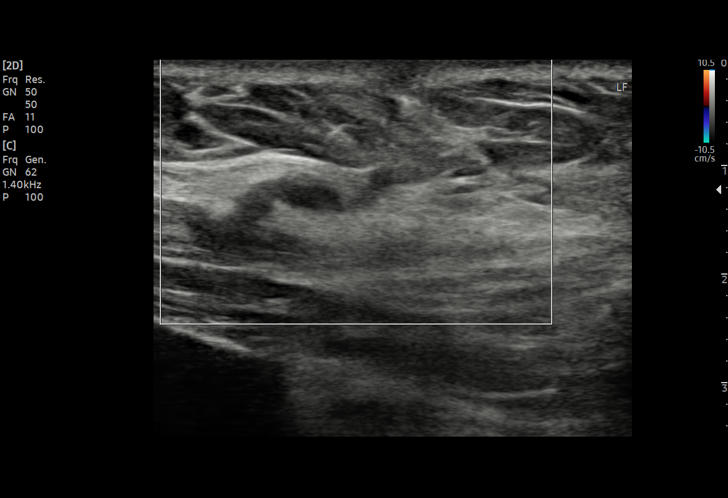
[im 11/15]
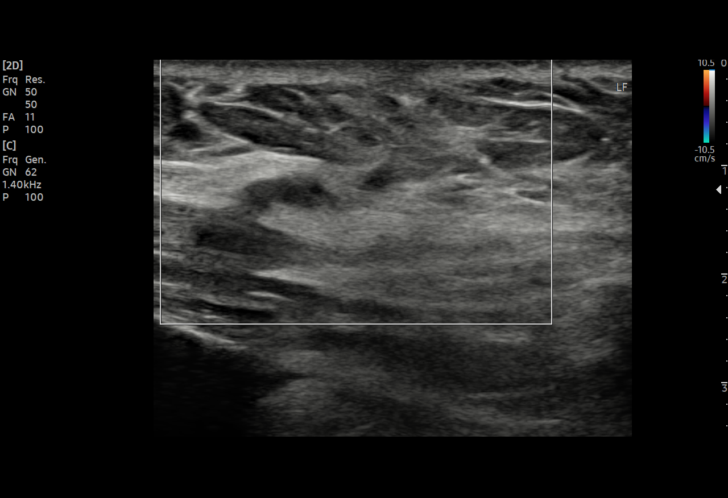
[im 12/15]
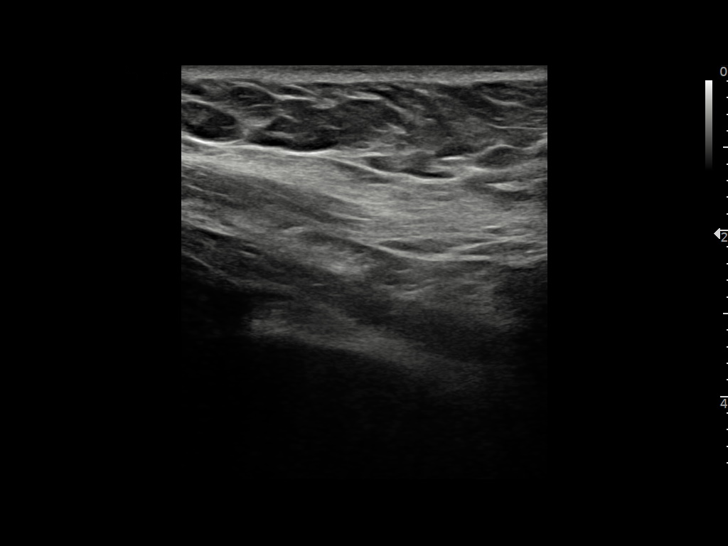
[im 13/15]
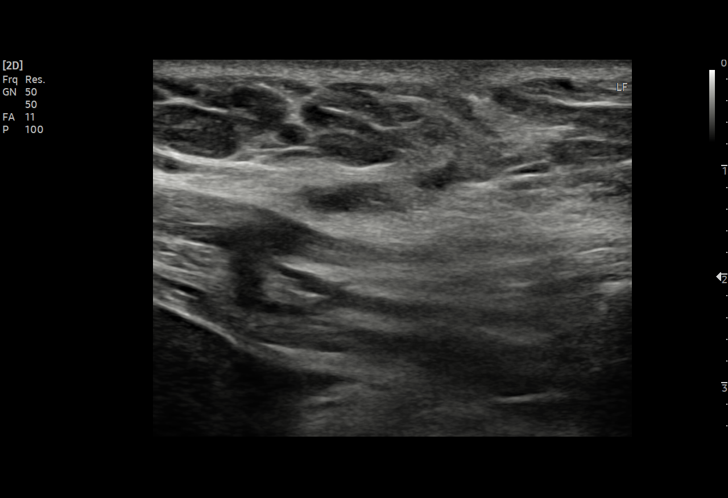
[im 14/15]
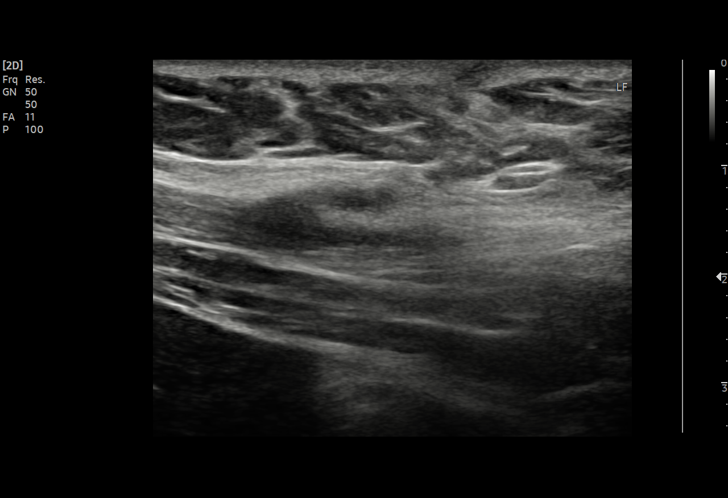
[im 15/15]
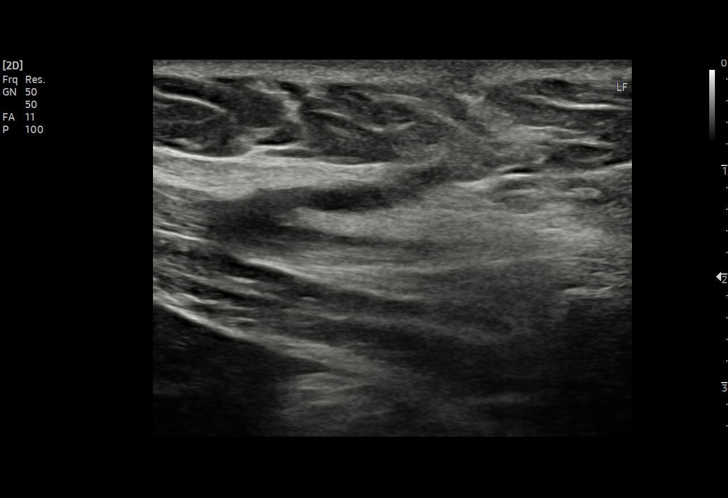

[15 of 15 positions shown; findings below may reference images not displayed]

FINDINGS: Longitudinal and transverse images were obtained along the right
upper quadrant anterior wall at site of recent surgery. A tract is
seen in this area with trace fluid in the tract. Beyond slight fluid
in the surgical tract, there is no fluid collection. No findings
suggesting soft tissue air in this region. No apparent abscess by
ultrasound. No mass or adenopathy in this area. No abnormal
vascularity in this region.
IMPRESSION: Tract at site of recent surgery in the right upper quadrant wall.
Slight fluid noted in this tract. No other foci of fluid. No air
seen. No evident abscess or mass. No other abnormality noted.

If there remains clinical concern for more extensive abnormality in
this area, CT would be the optimum study of choice to further
evaluate.

## 2021-11-27 ENCOUNTER — Encounter (HOSPITAL_COMMUNITY): Payer: Self-pay | Admitting: Obstetrics and Gynecology

## 2021-11-27 ENCOUNTER — Other Ambulatory Visit: Payer: Self-pay

## 2021-11-27 ENCOUNTER — Inpatient Hospital Stay (HOSPITAL_COMMUNITY)
Admission: AD | Admit: 2021-11-27 | Discharge: 2021-11-27 | Disposition: A | Discharge status: Home / Self Care | Payer: Medicaid Other | Attending: Obstetrics and Gynecology | Admitting: Obstetrics and Gynecology

## 2021-11-27 DIAGNOSIS — O99212 Obesity complicating pregnancy, second trimester: Secondary | ICD-10-CM | POA: Insufficient documentation

## 2021-11-27 DIAGNOSIS — Z3A25 25 weeks gestation of pregnancy: Secondary | ICD-10-CM

## 2021-11-27 DIAGNOSIS — O479 False labor, unspecified: Secondary | ICD-10-CM

## 2021-11-27 DIAGNOSIS — Z79899 Other long term (current) drug therapy: Secondary | ICD-10-CM | POA: Insufficient documentation

## 2021-11-27 DIAGNOSIS — O4702 False labor before 37 completed weeks of gestation, second trimester: Secondary | ICD-10-CM | POA: Diagnosis not present

## 2021-11-27 DIAGNOSIS — O26892 Other specified pregnancy related conditions, second trimester: Secondary | ICD-10-CM | POA: Diagnosis present

## 2021-11-27 DIAGNOSIS — R1033 Periumbilical pain: Secondary | ICD-10-CM | POA: Diagnosis present

## 2021-11-27 LAB — URINALYSIS, ROUTINE W REFLEX MICROSCOPIC
Bilirubin Urine: NEGATIVE
Glucose, UA: NEGATIVE mg/dL
Hgb urine dipstick: NEGATIVE
Ketones, ur: NEGATIVE mg/dL
Nitrite: NEGATIVE
Protein, ur: NEGATIVE mg/dL
Specific Gravity, Urine: 1.025 (ref 1.005–1.030)
pH: 5 (ref 5.0–8.0)

## 2021-11-27 NOTE — MAU Provider Note (Signed)
MAU Provider Note  History  [id number]  Arrival date and time: 11/27/21 1646   Chief Complaint  Patient presents with   Contractions     HPI Jordan Chung is a 31 y.o. [id number] at [redacted]w[redacted]d by LMP with PMHx notable for IDA, ASCUS, Obesity, who presents for contractions that started 2 days ago. On Saturday, pt felt acute onset of pain on abdomen and into her pelvis. The pain went away on its own, but returned on Sunday. Contractions would last for about 15 minutes and be 5 min apart. Then they would go away on their own. She drinks a lot of water per day.     Vaginal bleeding: No LOF: No Fetal Movement: Yes Contractions: Yes     OB History     Gravida  4   Para  2   Term  2   Preterm  0   AB  1   Living  2      SAB  1   IAB  0   Ectopic  0   Multiple  0   Live Births  2           Past Medical History:  Diagnosis Date   Hyperemesis gravidarum    Ovarian cyst    Postpartum depression    with first preg    Past Surgical History:  Procedure Laterality Date   BILIARY STENT PLACEMENT N/A 05/15/2020   Procedure: BILIARY STENT PLACEMENT;  Surgeon: Kerin Salen, MD;  Location: [facility];  Service: Gastroenterology;  Laterality: N/A;   BILIARY STENT PLACEMENT N/A 05/24/2020   Procedure: BILIARY STENT PLACEMENT;  Surgeon: Willis Modena, MD;  Location: [facility];  Service: Endoscopy;  Laterality: N/A;   BIOPSY  07/06/2020   Procedure: BIOPSY;  Surgeon: Kerin Salen, MD;  Location: [facility];  Service: Gastroenterology;;   CHOLECYSTECTOMY N/A 05/09/2020   Procedure: LAPAROSCOPIC CHOLECYSTECTOMY WITH INTRAOPERATIVE CHOLANGIOGRAM;  Surgeon: Abigail Miyamoto, MD;  Location: [facility];  Service: General;  Laterality: N/A;   ENDOSCOPIC RETROGRADE CHOLANGIOPANCREATOGRAPHY (ERCP) WITH PROPOFOL N/A 05/15/2020   Procedure: ENDOSCOPIC RETROGRADE CHOLANGIOPANCREATOGRAPHY (ERCP) WITH PROPOFOL;  Surgeon: Kerin Salen, MD;  Location: [facility];  Service: Gastroenterology;   Laterality: N/A;   ERCP N/A 05/24/2020   Procedure: ENDOSCOPIC RETROGRADE CHOLANGIOPANCREATOGRAPHY (ERCP);  Surgeon: Willis Modena, MD;  Location: [facility];  Service: Endoscopy;  Laterality: N/A;   ESOPHAGOGASTRODUODENOSCOPY (EGD) WITH PROPOFOL N/A 07/06/2020   Procedure: ESOPHAGOGASTRODUODENOSCOPY (EGD) WITH PROPOFOL;  Surgeon: Kerin Salen, MD;  Location: [facility];  Service: Gastroenterology;  Laterality: N/A;   GASTROINTESTINAL STENT REMOVAL N/A 07/06/2020   Procedure: GASTROINTESTINAL STENT REMOVAL;  Surgeon: Kerin Salen, MD;  Location: [facility];  Service: Gastroenterology;  Laterality: N/A;  removal of CBD and PD stent    IR RADIOLOGIST EVAL & MGMT  05/31/2020   PANCREATIC STENT PLACEMENT  05/15/2020   Procedure: PANCREATIC STENT PLACEMENT;  Surgeon: Kerin Salen, MD;  Location: [facility];  Service: Gastroenterology;;   [name]  05/15/2020   Procedure: [name];  Surgeon: Kerin Salen, MD;  Location: [facility];  Service: Gastroenterology;;   Francine Graven REMOVAL  05/24/2020   Procedure: STENT REMOVAL;  Surgeon: Willis Modena, MD;  Location: [facility];  Service: Endoscopy;;    Family History  Problem Relation Age of Onset   Diabetes Mother    Depression Mother    Hyperlipidemia Father    Hypertension Father    Cancer Paternal Grandfather     Social History   Socioeconomic History   Marital status: Divorced  Spouse name: Not on file   Number of children: Not on file   Years of education: Not on file   Highest education level: Not on file  Occupational History   Not on file  Tobacco Use   Smoking status: Never   Smokeless tobacco: Never  Vaping Use   Vaping Use: Never used  Substance and Sexual Activity   Alcohol use: Never   Drug use: Never   Sexual activity: Yes    Birth control/protection: I.U.D.  Other Topics Concern   Not on file  Social History Narrative   Not on file   Social Determinants of Health   Financial Resource Strain: Not on  file  Food Insecurity: Not on file  Transportation Needs: Not on file  Physical Activity: Not on file  Stress: Not on file  Social Connections: Not on file  Intimate Partner Violence: Not on file    No Known Allergies  No current facility-administered medications on file prior to encounter.   Current Outpatient Medications on File Prior to Encounter  Medication Sig Dispense Refill   acetaminophen (TYLENOL) 325 MG tablet Take 650 mg by mouth every 6 (six) hours as needed.     calcium carbonate (TUMS - DOSED IN MG ELEMENTAL CALCIUM) 500 MG chewable tablet Chew 1,000 mg by mouth daily as needed for indigestion or heartburn.     Multiple Vitamin (MULTIVITAMIN WITH MINERALS) TABS tablet Take 1 tablet by mouth daily.     ondansetron (ZOFRAN-ODT) 4 MG disintegrating tablet Take 4 mg by mouth every 8 (eight) hours as needed for nausea or vomiting.     pantoprazole (PROTONIX) 40 MG tablet Take 1 tablet (40 mg total) by mouth daily. 30 tablet 1   pantoprazole (PROTONIX) 40 MG tablet Take 1 tablet (40 mg total) by mouth daily. 60 tablet 1   promethazine (PHENERGAN) 25 MG tablet Take 25 mg by mouth every 6 (six) hours as needed for nausea or vomiting.     [DISCONTINUED] dicyclomine (BENTYL) 20 MG tablet Take 1 tablet (20 mg total) by mouth 2 (two) times daily. 20 tablet 0     Review of Systems  Constitutional:  Negative for appetite change, chills and fever.  HENT:  Negative for congestion, facial swelling and postnasal drip.   Eyes:  Negative for photophobia.  Respiratory:  Negative for cough and shortness of breath.   Cardiovascular:  Negative for chest pain.  Gastrointestinal:  Positive for abdominal pain. Negative for nausea and vomiting.  Endocrine: Negative for polyuria.  Genitourinary:  Negative for flank pain and pelvic pain.  Musculoskeletal:  Negative for arthralgias.  Skin:  Negative for rash.  Neurological:  Negative for weakness and light-headedness.  Psychiatric/Behavioral:   Negative for confusion.     Pertinent positives and negative per HPI, all others reviewed and negative  Physical Exam   BP (!) 116/56 (BP Location: Right Arm)   Pulse 75   Temp 98.4 F (36.9 C) (Oral)   Resp 16   Ht 5\' 8"  (1.727 m)   Wt 120.7 kg   SpO2 98% Comment: room air  BMI 40.45 kg/m   Patient Vitals for the past 24 hrs:  BP Temp Temp src Pulse Resp SpO2 Height Weight  11/27/21 1701 (!) 116/56 98.4 F (36.9 C) Oral 75 16 98 % -- --  11/27/21 1657 -- -- -- -- -- -- 5\' 8"  (1.727 m) 120.7 kg    Physical Exam Vitals reviewed.  Constitutional:      Appearance: Normal  appearance.  HENT:     Head: Normocephalic and atraumatic.     Right Ear: External ear normal.     Left Ear: External ear normal.  Cardiovascular:     Rate and Rhythm: Normal rate and regular rhythm.  Pulmonary:     Effort: Pulmonary effort is normal.     Breath sounds: Normal breath sounds.  Abdominal:     General: Abdomen is flat.     Palpations: Abdomen is soft.     Comments: gravid  Genitourinary:    Comments: SVE: internal os closed/50/-3 Skin:    General: Skin is warm.     Capillary Refill: Capillary refill takes less than 2 seconds.  Psychiatric:        Mood and Affect: Mood normal.     Cervical Exam Dilation: Closed Effacement (%): 50 Cervical Position: Posterior Station: -3 Exam by:: Mercado-Ortiz  Bedside Ultrasound not done  FHT Baseline: 140    Variability: moderate Accelerations: 10x10 Decelerations: none Contractions: none Category: 1  Labs Results for orders placed or performed during the hospital encounter of 11/27/21 (from the past 24 hour(s))  Urinalysis, Routine w reflex microscopic Urine, Clean Catch     Status: Abnormal   Collection Time: 11/27/21  5:40 PM  Result Value Ref Range   Color, Urine YELLOW YELLOW   APPearance HAZY (A) CLEAR   Specific Gravity, Urine 1.025 1.005 - 1.030   pH 5.0 5.0 - 8.0   Glucose, UA NEGATIVE NEGATIVE mg/dL   Hgb urine  dipstick NEGATIVE NEGATIVE   Bilirubin Urine NEGATIVE NEGATIVE   Ketones, ur NEGATIVE NEGATIVE mg/dL   Protein, ur NEGATIVE NEGATIVE mg/dL   Nitrite NEGATIVE NEGATIVE   Leukocytes,Ua SMALL (A) NEGATIVE   RBC / HPF 0-5 0 - 5 RBC/hpf   WBC, UA 0-5 0 - 5 WBC/hpf   Bacteria, UA MANY (A) NONE SEEN   Squamous Epithelial / LPF 6-10 0 - 5   Mucus PRESENT     Imaging No results found.  MAU Course  Procedures Lab Orders         Urinalysis, Routine w reflex microscopic Urine, Clean Catch    No orders of the defined types were placed in this encounter.  Imaging Orders  No imaging studies ordered today    MDM moderate  Assessment and Plan  Contractions-False labor [redacted] weeks gestation Fetal Well being NST Baseline: 140    Variability: moderate Accelerations: 10x10 Decelerations: none Contractions: none Category: 1 Discussed patient with RN. NST reviewed.   SVE closed. NST with some irritability, but no contractions. UA unremarkable.  Gave return and preterm labor precautions.  Follow-up with primary OB.  Dispo: discharged to home in stable condition.  Discharge Instructions     Discharge patient   Complete by: As directed    Discharge disposition: 01-Home or Self Care   Discharge patient date: 11/27/2021      Allergies as of 11/27/2021   No Known Allergies      Medication List     TAKE these medications    acetaminophen 325 MG tablet Commonly known as: TYLENOL Take 650 mg by mouth every 6 (six) hours as needed.   calcium carbonate 500 MG chewable tablet Commonly known as: TUMS - dosed in mg elemental calcium Chew 1,000 mg by mouth daily as needed for indigestion or heartburn.   multivitamin with minerals Tabs tablet Take 1 tablet by mouth daily.   ondansetron 4 MG disintegrating tablet Commonly known as: ZOFRAN-ODT Take 4 mg by  mouth every 8 (eight) hours as needed for nausea or vomiting.   pantoprazole 40 MG tablet Commonly known as: Protonix Take  1 tablet (40 mg total) by mouth daily.   pantoprazole 40 MG tablet Commonly known as: Protonix Take 1 tablet (40 mg total) by mouth daily.   promethazine 25 MG tablet Commonly known as: PHENERGAN Take 25 mg by mouth every 6 (six) hours as needed for nausea or vomiting.        Myrtie Hawk, DO FMOB Fellow, Faculty practice [facility] 11/27/21  6:31 PM

## 2021-11-27 NOTE — MAU Note (Signed)
Jordan Chung is a 31 y.o. at [redacted]w[redacted]d here in MAU reporting: started having contractions earlier today and they have progressed. Was advised to be seen by OB. Sometimes they are close together and sometimes they are more irregular. Also having pelvic pressure. No bleeding. No LOF. +FM  Onset of complaint: today  Pain score: 7/10  Vitals:   11/27/21 1701  BP: (!) 116/56  Pulse: 75  Resp: 16  Temp: 98.4 F (36.9 C)  SpO2: 98%     FHT:165  Lab orders placed from triage: UA

## 2022-07-28 ENCOUNTER — Other Ambulatory Visit: Payer: Self-pay

## 2022-07-28 ENCOUNTER — Emergency Department (HOSPITAL_BASED_OUTPATIENT_CLINIC_OR_DEPARTMENT_OTHER)
Admission: EM | Admit: 2022-07-28 | Discharge: 2022-07-28 | Disposition: A | Discharge status: Home / Self Care | Payer: Medicaid Other | Attending: Emergency Medicine | Admitting: Emergency Medicine

## 2022-07-28 ENCOUNTER — Encounter (HOSPITAL_BASED_OUTPATIENT_CLINIC_OR_DEPARTMENT_OTHER): Payer: Self-pay

## 2022-07-28 DIAGNOSIS — B963 Hemophilus influenzae [H. influenzae] as the cause of diseases classified elsewhere: Secondary | ICD-10-CM | POA: Diagnosis not present

## 2022-07-28 DIAGNOSIS — A492 Hemophilus influenzae infection, unspecified site: Secondary | ICD-10-CM

## 2022-07-28 DIAGNOSIS — R509 Fever, unspecified: Secondary | ICD-10-CM | POA: Diagnosis present

## 2022-07-28 MED ORDER — CEFDINIR 300 MG PO CAPS: 300.0000 mg | ORAL_CAPSULE | Freq: Two times a day (BID) | ORAL | 0 refills | Status: AC

## 2022-07-28 NOTE — ED Provider Notes (Signed)
[facility] Provider Note   CSN: [id number] Arrival date & time: 07/28/22  5284     History  Chief Complaint  Patient presents with   Fever    Jordan Chung is a 32 y.o. female.  The history is provided by the patient.  Fever She has been having fever, sore throat, body aches for the last 6 days.  Her 2 of her children had strep infection and she went to an urgent care center where strep PCR was negative and she had a respiratory pathogen panel which was positive for haemophilus influenza.  She was started on amoxicillin which she took for 4 days without any clinical improvement and she was switched to azithromycin with the first dose being given yesterday.  Today, she continues to run fevers as high as 101.9 with associated chills and sweats and bodyaches.  She has had some nausea and some diarrhea.  Of note, she is also breast-feeding a 8-month-old baby.   Home Medications Prior to Admission medications   Medication Sig Start Date End Date Taking? Authorizing Provider  cefdinir (OMNICEF) 300 MG capsule Take 1 capsule (300 mg total) by mouth 2 (two) times daily. 07/28/22  Yes Dione Booze, MD  acetaminophen (TYLENOL) 325 MG tablet Take 650 mg by mouth every 6 (six) hours as needed.    [provider]  calcium carbonate (TUMS - DOSED IN MG ELEMENTAL CALCIUM) 500 MG chewable tablet Chew 1,000 mg by mouth daily as needed for indigestion or heartburn.    [provider]  Multiple Vitamin (MULTIVITAMIN WITH MINERALS) TABS tablet Take 1 tablet by mouth daily.    [provider]  ondansetron (ZOFRAN-ODT) 4 MG disintegrating tablet Take 4 mg by mouth every 8 (eight) hours as needed for nausea or vomiting.    [provider]  pantoprazole (PROTONIX) 40 MG tablet Take 1 tablet (40 mg total) by mouth daily. 06/04/20 11/27/21  Rodolph Bong, MD  pantoprazole (PROTONIX) 40 MG tablet Take 1 tablet (40 mg total) by  mouth daily. 07/06/20 09/04/20  Kerin Salen, MD  promethazine (PHENERGAN) 25 MG tablet Take 25 mg by mouth every 6 (six) hours as needed for nausea or vomiting.    [provider]  dicyclomine (BENTYL) 20 MG tablet Take 1 tablet (20 mg total) by mouth 2 (two) times daily. 03/27/19 10/06/19  Lorelee New, PA-C      Allergies    Patient has no known allergies.    Review of Systems   Review of Systems  Constitutional:  Positive for fever.  All other systems reviewed and are negative.   Physical Exam Updated Vital Signs BP 138/71   Pulse (!) 106   Temp 99.2 F (37.3 C) (Oral)   Resp 20   Ht 5\' 9"  (1.753 m)   Wt 127 kg   SpO2 98%   Breastfeeding Yes   BMI 41.35 kg/m  Physical Exam Vitals and nursing note reviewed.   32 year old female, resting comfortably and in no acute distress. Vital signs are significant for borderline elevated heart rate. Oxygen saturation is 98%, which is normal. Head is normocephalic and atraumatic. PERRLA, EOMI. Oropharynx is mildly erythematous. Neck is supple with tender posterior cervical adenopathy which is worse on the left. Lungs are clear without rales, wheezes, or rhonchi. Chest is nontender. Heart has regular rate and rhythm without murmur. Abdomen is soft, flat, nontender. Extremities have no cyanosis or edema, full range of motion is  present. Skin is warm and dry without rash. Neurologic: Mental status is normal, cranial nerves are intact, moves all extremities equally.  ED Results / Procedures / Treatments    Procedures Procedures    Medications Ordered in ED Medications - No data to display  ED Course/ Medical Decision Making/ A&P                             Medical Decision Making Risk Prescription drug management.   Apparent Haemophilus influenzae infection which is not responding to amoxicillin, and has not had an adequate trial of azithromycin.  I have reviewed records on care everywhere and cannot find her test  results.  I have discussed with the patient the fact that she really needs to give the antibiotic 48-hour trial to see if it is helping her.  I am discharging her with a prescription for cefdinir with instructions to start taking it if she is not showing improvement after an additional 24 hours.  She is to continue with aggressive fluids, antipyretics.  Final Clinical Impression(s) / ED Diagnoses Final diagnoses:  Haemophilus influenzae infection  Fever in adult    Rx / DC Orders ED Discharge Orders          Ordered    cefdinir (OMNICEF) 300 MG capsule  2 times daily        07/28/22 0656              Dione Booze, MD 07/28/22 [phone]

## 2022-07-28 NOTE — ED Triage Notes (Addendum)
Says Monday she began feeling very bad. Kids were strept positive so she went to UC and was evaluated.   Her strept was negative but tested positive for H-FLU.   Has been taking tylenol. At 4am temp was 101.6. Took tylenol at home.   Has a newborn and is breast feeding pt verbalizes concern of continuing to feel worse while feeding.   Was taking amoxicillin assuming she was strept + also and recently started taking azithromycin.

## 2022-07-28 NOTE — Discharge Instructions (Signed)
Drink plenty of fluids.  Continue to take acetaminophen and/or ibuprofen as needed for fever and aching.  If you are not showing any improvement over the next 24 hours, stop taking the azithromycin and start taking cefdinir.  If your baby is showing any signs of an infection such as fever, decreased appetite or poor feeding or fussiness, take the baby into your pediatrician or to the pediatric emergency department.
